# Patient Record
Sex: Female | Born: 1975
Health system: Southern US, Community
[De-identification: ages and names within clinical notes are randomized; demographics above are authoritative.]

## PROBLEM LIST (undated history)

## (undated) DIAGNOSIS — R002 Palpitations: Secondary | ICD-10-CM

## (undated) DIAGNOSIS — J45909 Unspecified asthma, uncomplicated: Secondary | ICD-10-CM

## (undated) HISTORY — PX: TUBAL LIGATION: SHX77

## (undated) HISTORY — PX: GALLBLADDER SURGERY: SHX652

## (undated) HISTORY — DX: Palpitations: R00.2

---

## 1997-09-25 ENCOUNTER — Ambulatory Visit (HOSPITAL_COMMUNITY): Admission: RE | Admit: 1997-09-25 | Discharge: 1997-09-25 | Payer: 59 | Admitting: *Deleted

## 1997-12-28 ENCOUNTER — Ambulatory Visit (HOSPITAL_COMMUNITY): Admission: RE | Admit: 1997-12-28 | Discharge: 1997-12-28 | Payer: Self-pay | Admitting: Obstetrics & Gynecology

## 1998-01-23 ENCOUNTER — Inpatient Hospital Stay (HOSPITAL_COMMUNITY): Admission: AD | Admit: 1998-01-23 | Discharge: 1998-01-23 | Payer: Self-pay | Admitting: Obstetrics

## 1998-02-26 ENCOUNTER — Encounter (HOSPITAL_COMMUNITY): Admission: RE | Admit: 1998-02-26 | Discharge: 1998-03-04 | Payer: Self-pay | Admitting: Obstetrics & Gynecology

## 1998-03-05 ENCOUNTER — Inpatient Hospital Stay (HOSPITAL_COMMUNITY): Admission: AD | Admit: 1998-03-05 | Discharge: 1998-03-07 | Payer: Self-pay | Admitting: *Deleted

## 1998-05-29 ENCOUNTER — Encounter: Payer: Self-pay | Admitting: Emergency Medicine

## 1998-05-29 ENCOUNTER — Inpatient Hospital Stay (HOSPITAL_COMMUNITY): Admission: EM | Admit: 1998-05-29 | Discharge: 1998-06-02 | Payer: Self-pay | Admitting: Emergency Medicine

## 1998-05-31 ENCOUNTER — Encounter: Payer: Self-pay | Admitting: Family Medicine

## 1998-10-17 ENCOUNTER — Emergency Department (HOSPITAL_COMMUNITY): Admission: EM | Admit: 1998-10-17 | Discharge: 1998-10-17 | Payer: Self-pay | Admitting: Emergency Medicine

## 1999-02-21 ENCOUNTER — Emergency Department (HOSPITAL_COMMUNITY): Admission: EM | Admit: 1999-02-21 | Discharge: 1999-02-21 | Payer: Self-pay | Admitting: Emergency Medicine

## 1999-02-23 ENCOUNTER — Encounter: Payer: Self-pay | Admitting: Emergency Medicine

## 1999-02-23 ENCOUNTER — Ambulatory Visit (HOSPITAL_COMMUNITY): Admission: RE | Admit: 1999-02-23 | Discharge: 1999-02-23 | Payer: Self-pay | Admitting: Emergency Medicine

## 1999-02-27 ENCOUNTER — Emergency Department (HOSPITAL_COMMUNITY): Admission: EM | Admit: 1999-02-27 | Discharge: 1999-02-27 | Payer: Self-pay | Admitting: *Deleted

## 1999-03-04 ENCOUNTER — Observation Stay (HOSPITAL_COMMUNITY): Admission: RE | Admit: 1999-03-04 | Discharge: 1999-03-05 | Payer: Self-pay | Admitting: General Surgery

## 1999-03-04 ENCOUNTER — Encounter (INDEPENDENT_AMBULATORY_CARE_PROVIDER_SITE_OTHER): Payer: Self-pay | Admitting: Specialist

## 1999-04-18 ENCOUNTER — Emergency Department (HOSPITAL_COMMUNITY): Admission: EM | Admit: 1999-04-18 | Discharge: 1999-04-18 | Payer: Self-pay | Admitting: Emergency Medicine

## 1999-10-02 ENCOUNTER — Encounter: Payer: Self-pay | Admitting: Emergency Medicine

## 1999-10-02 ENCOUNTER — Emergency Department (HOSPITAL_COMMUNITY): Admission: EM | Admit: 1999-10-02 | Discharge: 1999-10-02 | Payer: Self-pay | Admitting: Emergency Medicine

## 2000-01-29 ENCOUNTER — Encounter: Payer: Self-pay | Admitting: Emergency Medicine

## 2000-01-29 ENCOUNTER — Inpatient Hospital Stay (HOSPITAL_COMMUNITY): Admission: EM | Admit: 2000-01-29 | Discharge: 2000-02-02 | Payer: Self-pay | Admitting: Emergency Medicine

## 2000-02-08 ENCOUNTER — Ambulatory Visit (HOSPITAL_COMMUNITY): Admission: RE | Admit: 2000-02-08 | Discharge: 2000-02-08 | Payer: Self-pay | Admitting: *Deleted

## 2000-02-08 ENCOUNTER — Encounter: Payer: Self-pay | Admitting: *Deleted

## 2000-02-09 ENCOUNTER — Encounter: Admission: RE | Admit: 2000-02-09 | Discharge: 2000-02-09 | Payer: Self-pay | Admitting: Family Medicine

## 2000-04-16 ENCOUNTER — Encounter: Admission: RE | Admit: 2000-04-16 | Discharge: 2000-04-16 | Payer: Self-pay | Admitting: Sports Medicine

## 2000-04-16 ENCOUNTER — Inpatient Hospital Stay (HOSPITAL_COMMUNITY): Admission: AD | Admit: 2000-04-16 | Discharge: 2000-04-16 | Payer: Self-pay | Admitting: *Deleted

## 2000-05-26 ENCOUNTER — Inpatient Hospital Stay (HOSPITAL_COMMUNITY): Admission: AD | Admit: 2000-05-26 | Discharge: 2000-05-28 | Payer: Self-pay | Admitting: *Deleted

## 2000-11-12 ENCOUNTER — Encounter: Admission: RE | Admit: 2000-11-12 | Discharge: 2000-11-12 | Payer: Self-pay | Admitting: Family Medicine

## 2000-11-15 ENCOUNTER — Encounter: Admission: RE | Admit: 2000-11-15 | Discharge: 2000-11-15 | Payer: Self-pay | Admitting: Family Medicine

## 2001-01-19 ENCOUNTER — Encounter (INDEPENDENT_AMBULATORY_CARE_PROVIDER_SITE_OTHER): Payer: Self-pay | Admitting: *Deleted

## 2001-01-19 LAB — CONVERTED CEMR LAB

## 2001-12-09 ENCOUNTER — Encounter: Admission: RE | Admit: 2001-12-09 | Discharge: 2001-12-09 | Payer: Self-pay | Admitting: Family Medicine

## 2001-12-09 ENCOUNTER — Inpatient Hospital Stay (HOSPITAL_COMMUNITY): Admission: AD | Admit: 2001-12-09 | Discharge: 2001-12-11 | Payer: Self-pay | Admitting: Family Medicine

## 2001-12-09 ENCOUNTER — Encounter: Payer: Self-pay | Admitting: Family Medicine

## 2001-12-16 ENCOUNTER — Encounter: Admission: RE | Admit: 2001-12-16 | Discharge: 2001-12-16 | Payer: Self-pay | Admitting: Family Medicine

## 2002-09-09 ENCOUNTER — Encounter: Admission: RE | Admit: 2002-09-09 | Discharge: 2002-09-09 | Payer: Self-pay | Admitting: Family Medicine

## 2002-09-30 ENCOUNTER — Encounter: Admission: RE | Admit: 2002-09-30 | Discharge: 2002-09-30 | Payer: Self-pay | Admitting: Family Medicine

## 2002-11-27 ENCOUNTER — Encounter: Admission: RE | Admit: 2002-11-27 | Discharge: 2002-11-27 | Payer: Self-pay | Admitting: Family Medicine

## 2003-02-04 ENCOUNTER — Ambulatory Visit (HOSPITAL_COMMUNITY): Admission: RE | Admit: 2003-02-04 | Discharge: 2003-02-04 | Payer: Self-pay | Admitting: Family Medicine

## 2003-02-04 ENCOUNTER — Encounter: Admission: RE | Admit: 2003-02-04 | Discharge: 2003-02-04 | Payer: Self-pay | Admitting: Family Medicine

## 2003-02-13 ENCOUNTER — Encounter: Admission: RE | Admit: 2003-02-13 | Discharge: 2003-02-13 | Payer: Self-pay | Admitting: Family Medicine

## 2003-03-16 ENCOUNTER — Encounter: Admission: RE | Admit: 2003-03-16 | Discharge: 2003-03-16 | Payer: Self-pay | Admitting: Family Medicine

## 2003-03-30 ENCOUNTER — Encounter: Admission: RE | Admit: 2003-03-30 | Discharge: 2003-03-30 | Payer: Self-pay | Admitting: Sports Medicine

## 2003-05-07 ENCOUNTER — Encounter: Admission: RE | Admit: 2003-05-07 | Discharge: 2003-05-07 | Payer: Self-pay | Admitting: Family Medicine

## 2003-06-25 ENCOUNTER — Encounter: Admission: RE | Admit: 2003-06-25 | Discharge: 2003-06-25 | Payer: Self-pay | Admitting: Family Medicine

## 2003-08-05 ENCOUNTER — Encounter: Admission: RE | Admit: 2003-08-05 | Discharge: 2003-08-05 | Payer: Self-pay | Admitting: Family Medicine

## 2003-09-18 ENCOUNTER — Encounter: Admission: RE | Admit: 2003-09-18 | Discharge: 2003-09-18 | Payer: Self-pay | Admitting: Sports Medicine

## 2003-09-24 ENCOUNTER — Encounter: Admission: RE | Admit: 2003-09-24 | Discharge: 2003-09-24 | Payer: Self-pay | Admitting: Sports Medicine

## 2004-12-14 ENCOUNTER — Emergency Department (HOSPITAL_COMMUNITY): Admission: EM | Admit: 2004-12-14 | Discharge: 2004-12-14 | Payer: Self-pay | Admitting: Emergency Medicine

## 2005-10-19 ENCOUNTER — Ambulatory Visit: Payer: Self-pay | Admitting: Family Medicine

## 2005-10-21 ENCOUNTER — Ambulatory Visit: Payer: Self-pay | Admitting: Family Medicine

## 2005-10-21 ENCOUNTER — Inpatient Hospital Stay (HOSPITAL_COMMUNITY): Admission: AD | Admit: 2005-10-21 | Discharge: 2005-10-23 | Payer: Self-pay | Admitting: Family Medicine

## 2005-11-03 ENCOUNTER — Ambulatory Visit: Payer: Self-pay | Admitting: Sports Medicine

## 2005-12-14 ENCOUNTER — Other Ambulatory Visit: Admission: RE | Admit: 2005-12-14 | Discharge: 2005-12-14 | Payer: Self-pay | Admitting: Obstetrics and Gynecology

## 2006-03-06 ENCOUNTER — Ambulatory Visit: Payer: Self-pay | Admitting: Sports Medicine

## 2006-03-07 ENCOUNTER — Emergency Department (HOSPITAL_COMMUNITY): Admission: EM | Admit: 2006-03-07 | Discharge: 2006-03-07 | Payer: Self-pay | Admitting: Family Medicine

## 2006-03-08 ENCOUNTER — Inpatient Hospital Stay (HOSPITAL_COMMUNITY): Admission: EM | Admit: 2006-03-08 | Discharge: 2006-03-12 | Payer: Self-pay | Admitting: Emergency Medicine

## 2006-03-08 ENCOUNTER — Ambulatory Visit: Payer: Self-pay | Admitting: Family Medicine

## 2006-06-18 ENCOUNTER — Inpatient Hospital Stay (HOSPITAL_COMMUNITY): Admission: AD | Admit: 2006-06-18 | Discharge: 2006-06-18 | Payer: Self-pay | Admitting: Obstetrics and Gynecology

## 2006-06-24 ENCOUNTER — Inpatient Hospital Stay (HOSPITAL_COMMUNITY): Admission: AD | Admit: 2006-06-24 | Discharge: 2006-06-26 | Payer: Self-pay | Admitting: Obstetrics and Gynecology

## 2006-06-25 ENCOUNTER — Encounter (INDEPENDENT_AMBULATORY_CARE_PROVIDER_SITE_OTHER): Payer: Self-pay | Admitting: *Deleted

## 2006-06-27 ENCOUNTER — Encounter: Admission: RE | Admit: 2006-06-27 | Discharge: 2006-07-26 | Payer: Self-pay | Admitting: Obstetrics and Gynecology

## 2006-07-03 ENCOUNTER — Inpatient Hospital Stay (HOSPITAL_COMMUNITY): Admission: AD | Admit: 2006-07-03 | Discharge: 2006-07-03 | Payer: Self-pay | Admitting: Obstetrics and Gynecology

## 2006-07-27 ENCOUNTER — Encounter: Admission: RE | Admit: 2006-07-27 | Discharge: 2006-08-26 | Payer: Self-pay | Admitting: Obstetrics and Gynecology

## 2006-08-27 ENCOUNTER — Encounter: Admission: RE | Admit: 2006-08-27 | Discharge: 2006-09-26 | Payer: Self-pay | Admitting: Obstetrics and Gynecology

## 2006-09-27 ENCOUNTER — Encounter: Admission: RE | Admit: 2006-09-27 | Discharge: 2006-10-25 | Payer: Self-pay | Admitting: Obstetrics and Gynecology

## 2006-10-18 DIAGNOSIS — J4530 Mild persistent asthma, uncomplicated: Secondary | ICD-10-CM | POA: Insufficient documentation

## 2006-10-19 ENCOUNTER — Encounter (INDEPENDENT_AMBULATORY_CARE_PROVIDER_SITE_OTHER): Payer: Self-pay | Admitting: *Deleted

## 2006-10-26 ENCOUNTER — Encounter: Admission: RE | Admit: 2006-10-26 | Discharge: 2006-11-25 | Payer: Self-pay | Admitting: Obstetrics and Gynecology

## 2006-11-19 ENCOUNTER — Ambulatory Visit: Payer: Self-pay | Admitting: Family Medicine

## 2006-11-19 ENCOUNTER — Telehealth: Payer: Self-pay | Admitting: *Deleted

## 2006-11-19 DIAGNOSIS — J45901 Unspecified asthma with (acute) exacerbation: Secondary | ICD-10-CM | POA: Insufficient documentation

## 2006-11-26 ENCOUNTER — Encounter: Admission: RE | Admit: 2006-11-26 | Discharge: 2006-12-25 | Payer: Self-pay | Admitting: Obstetrics and Gynecology

## 2006-12-26 ENCOUNTER — Encounter: Admission: RE | Admit: 2006-12-26 | Discharge: 2007-01-01 | Payer: Self-pay | Admitting: Obstetrics and Gynecology

## 2007-04-29 ENCOUNTER — Ambulatory Visit: Payer: Self-pay | Admitting: Family Medicine

## 2007-04-29 DIAGNOSIS — L708 Other acne: Secondary | ICD-10-CM | POA: Insufficient documentation

## 2007-08-23 ENCOUNTER — Encounter: Payer: Self-pay | Admitting: *Deleted

## 2007-11-22 ENCOUNTER — Telehealth (INDEPENDENT_AMBULATORY_CARE_PROVIDER_SITE_OTHER): Payer: Self-pay | Admitting: Family Medicine

## 2007-11-25 ENCOUNTER — Ambulatory Visit: Payer: Self-pay | Admitting: Family Medicine

## 2008-01-02 ENCOUNTER — Encounter (INDEPENDENT_AMBULATORY_CARE_PROVIDER_SITE_OTHER): Payer: Self-pay | Admitting: Family Medicine

## 2008-01-02 ENCOUNTER — Ambulatory Visit: Payer: Self-pay | Admitting: Family Medicine

## 2008-01-02 DIAGNOSIS — E669 Obesity, unspecified: Secondary | ICD-10-CM | POA: Insufficient documentation

## 2008-01-02 DIAGNOSIS — N920 Excessive and frequent menstruation with regular cycle: Secondary | ICD-10-CM | POA: Insufficient documentation

## 2008-01-06 LAB — CONVERTED CEMR LAB: Pap Smear: NORMAL

## 2008-01-08 ENCOUNTER — Ambulatory Visit: Payer: Self-pay | Admitting: Family Medicine

## 2008-04-10 ENCOUNTER — Ambulatory Visit: Payer: Self-pay | Admitting: Family Medicine

## 2008-04-10 ENCOUNTER — Encounter (INDEPENDENT_AMBULATORY_CARE_PROVIDER_SITE_OTHER): Payer: Self-pay | Admitting: Family Medicine

## 2008-04-10 DIAGNOSIS — N949 Unspecified condition associated with female genital organs and menstrual cycle: Secondary | ICD-10-CM | POA: Insufficient documentation

## 2008-04-10 DIAGNOSIS — N938 Other specified abnormal uterine and vaginal bleeding: Secondary | ICD-10-CM | POA: Insufficient documentation

## 2008-04-10 DIAGNOSIS — N925 Other specified irregular menstruation: Secondary | ICD-10-CM | POA: Insufficient documentation

## 2008-04-10 LAB — CONVERTED CEMR LAB
Hemoglobin: 10.8 g/dL — ABNORMAL LOW (ref 12.0–15.0)
Platelets: 271 10*3/uL (ref 150–400)
RBC: 4.01 M/uL (ref 3.87–5.11)
RDW: 14.7 % (ref 11.5–15.5)
TSH: 1.981 microintl units/mL (ref 0.350–4.50)

## 2008-04-13 ENCOUNTER — Encounter (INDEPENDENT_AMBULATORY_CARE_PROVIDER_SITE_OTHER): Payer: Self-pay | Admitting: Family Medicine

## 2008-04-16 ENCOUNTER — Encounter: Admission: RE | Admit: 2008-04-16 | Discharge: 2008-04-16 | Payer: Self-pay | Admitting: Family Medicine

## 2008-04-20 ENCOUNTER — Encounter (INDEPENDENT_AMBULATORY_CARE_PROVIDER_SITE_OTHER): Payer: Self-pay | Admitting: Family Medicine

## 2008-10-07 ENCOUNTER — Telehealth: Payer: Self-pay | Admitting: *Deleted

## 2008-10-13 ENCOUNTER — Encounter (INDEPENDENT_AMBULATORY_CARE_PROVIDER_SITE_OTHER): Payer: Self-pay | Admitting: Family Medicine

## 2008-10-14 ENCOUNTER — Encounter (INDEPENDENT_AMBULATORY_CARE_PROVIDER_SITE_OTHER): Payer: Self-pay | Admitting: Family Medicine

## 2008-10-14 ENCOUNTER — Ambulatory Visit: Payer: Self-pay | Admitting: Family Medicine

## 2008-10-14 LAB — CONVERTED CEMR LAB: Rapid Strep: POSITIVE

## 2009-01-21 ENCOUNTER — Ambulatory Visit: Payer: Self-pay | Admitting: Family Medicine

## 2009-08-30 ENCOUNTER — Ambulatory Visit: Payer: Self-pay | Admitting: Family Medicine

## 2009-08-30 ENCOUNTER — Encounter: Payer: Self-pay | Admitting: Family Medicine

## 2009-08-30 ENCOUNTER — Telehealth: Payer: Self-pay | Admitting: Family Medicine

## 2009-11-22 ENCOUNTER — Encounter: Payer: Self-pay | Admitting: Family Medicine

## 2009-11-22 ENCOUNTER — Ambulatory Visit: Payer: Self-pay | Admitting: Family Medicine

## 2009-11-22 ENCOUNTER — Telehealth: Payer: Self-pay | Admitting: Family Medicine

## 2009-11-22 DIAGNOSIS — D4959 Neoplasm of unspecified behavior of other genitourinary organ: Secondary | ICD-10-CM | POA: Insufficient documentation

## 2009-11-22 LAB — CONVERTED CEMR LAB
Chloride: 102 meq/L (ref 96–112)
Creatinine, Ser: 0.57 mg/dL (ref 0.40–1.20)
Potassium: 4.4 meq/L (ref 3.5–5.3)
Sodium: 139 meq/L (ref 135–145)

## 2009-11-23 ENCOUNTER — Encounter: Payer: Self-pay | Admitting: Family Medicine

## 2009-11-25 ENCOUNTER — Ambulatory Visit (HOSPITAL_COMMUNITY): Admission: RE | Admit: 2009-11-25 | Discharge: 2009-11-25 | Payer: Self-pay | Admitting: Family Medicine

## 2009-11-25 ENCOUNTER — Encounter: Payer: Self-pay | Admitting: Family Medicine

## 2010-02-11 ENCOUNTER — Telehealth: Payer: Self-pay | Admitting: Family Medicine

## 2010-02-11 ENCOUNTER — Emergency Department (HOSPITAL_COMMUNITY): Admission: EM | Admit: 2010-02-11 | Discharge: 2010-02-11 | Payer: Self-pay | Admitting: Family Medicine

## 2010-02-14 ENCOUNTER — Ambulatory Visit: Payer: Self-pay | Admitting: Family Medicine

## 2010-02-14 ENCOUNTER — Telehealth: Payer: Self-pay | Admitting: Family Medicine

## 2010-02-14 LAB — CONVERTED CEMR LAB
Bilirubin Urine: NEGATIVE
Glucose, Urine, Semiquant: NEGATIVE
Ketones, urine, test strip: NEGATIVE
Specific Gravity, Urine: 1.025
WBC Urine, dipstick: NEGATIVE

## 2010-05-04 ENCOUNTER — Encounter: Payer: Self-pay | Admitting: Family Medicine

## 2010-05-23 ENCOUNTER — Encounter: Payer: Self-pay | Admitting: Family Medicine

## 2010-05-23 ENCOUNTER — Ambulatory Visit: Payer: Self-pay | Admitting: Vascular Surgery

## 2010-05-23 ENCOUNTER — Ambulatory Visit: Admission: RE | Admit: 2010-05-23 | Discharge: 2010-05-23 | Payer: Self-pay | Admitting: Family Medicine

## 2010-05-23 ENCOUNTER — Telehealth: Payer: Self-pay | Admitting: Family Medicine

## 2010-05-23 ENCOUNTER — Ambulatory Visit: Payer: Self-pay | Admitting: Family Medicine

## 2010-05-23 DIAGNOSIS — R609 Edema, unspecified: Secondary | ICD-10-CM | POA: Insufficient documentation

## 2010-05-23 LAB — CONVERTED CEMR LAB
Basophils Relative: 1 % (ref 0–1)
Eosinophils Absolute: 0.3 10*3/uL (ref 0.0–0.7)
Eosinophils Relative: 6 % — ABNORMAL HIGH (ref 0–5)
Lymphs Abs: 1.8 10*3/uL (ref 0.7–4.0)
MCV: 88 fL (ref 78.0–100.0)
Monocytes Relative: 4 % (ref 3–12)
Neutro Abs: 2.5 10*3/uL (ref 1.7–7.7)
RDW: 14.4 % (ref 11.5–15.5)
WBC: 4.8 10*3/uL (ref 4.0–10.5)

## 2010-05-24 ENCOUNTER — Encounter: Payer: Self-pay | Admitting: *Deleted

## 2010-06-16 ENCOUNTER — Ambulatory Visit: Payer: Self-pay | Admitting: Family Medicine

## 2010-06-16 ENCOUNTER — Encounter: Payer: Self-pay | Admitting: Sports Medicine

## 2010-06-30 ENCOUNTER — Telehealth: Payer: Self-pay | Admitting: *Deleted

## 2010-07-01 ENCOUNTER — Ambulatory Visit: Payer: Self-pay | Admitting: Family Medicine

## 2010-07-01 LAB — CONVERTED CEMR LAB
Bilirubin Urine: NEGATIVE
Ketones, urine, test strip: NEGATIVE
Specific Gravity, Urine: 1.02
Urobilinogen, UA: 0.2
pH: 6

## 2010-07-04 ENCOUNTER — Telehealth: Payer: Self-pay | Admitting: Family Medicine

## 2010-07-05 ENCOUNTER — Encounter: Payer: Self-pay | Admitting: Family Medicine

## 2010-09-10 ENCOUNTER — Encounter: Payer: Self-pay | Admitting: Family Medicine

## 2010-09-20 NOTE — Assessment & Plan Note (Signed)
Summary: asthma/Donna Carlson/Donna Carlson's   Vital Signs:  Patient profile:   35 year old female Height:      64 inches Weight:      210 pounds BMI:     36.18 BSA:     2.00 O2 Sat:      99 % on Room air Temp:     98.8 degrees F Pulse rate:   82 / minute BP sitting:   139 / 89  Vitals Entered By: Jone Baseman CMA (August 30, 2009 1:47 PM)  O2 Flow:  Room air CC: asthma since saturday Is Patient Diabetic? No Pain Assessment Patient in pain? no        Primary Care Provider:  Lupita Raider MD  CC:  asthma since saturday.  History of Present Illness: Asthma flare for last few days worsening breathing with wheezing. Using albuterol nebs every 4 hrs last night. No fever or purulent sputum or shortness of breath at rest or leg swelling.  Similar to previous asthma episodes.  Hospitlaized last in 2007 when pregant with asthma  ROS - as above PMH - Medications reviewed and updated in medication list.  Smoking Status noted in VS form    Habits & Providers  Alcohol-Tobacco-Diet     Tobacco Status: never  Current Medications (verified): 1)  Albuterol Sulfate (5 Mg/ml) 0.5% Nebu (Albuterol Sulfate) .... Inhale 1 Ml As Directed Four Times A Day - 1 Box 2)  Flovent Hfa 220 Mcg/act Aero (Fluticasone Propionate  Hfa) .... Inhale 1 Puff Using Inhaler Twice A Day 3)  Proair Hfa 108 (90 Base) Mcg/act  Aers (Albuterol Sulfate) .... Two Puffs Every Four Hours As Needed For Wheezing 4)  Valtrex 500 Mg  Tabs (Valacyclovir Hcl) .... One By Mouth Two Times A Day As Needed 5)  Prednisone 20 Mg Tabs (Prednisone) .... 2 Tabs A Day For 7 Days  Allergies: 1)  ! * Shellfish  Physical Exam  General:  awake alert no acute distress  Mouth:  Oral mucosa and oropharynx without lesions or exudates.  Teeth in good repair. Neck:  No deformities, masses, or tenderness noted. Lungs:  diffuse mild expiratory wheeze increased with deep breath no rales or increased work of breathing  Heart:  Normal rate and  regular rhythm. S1 and S2 normal without gallop, murmur, click, rub or other extra sounds. Extremities:  No clubbing, cyanosis, edema, or deformity noted with normal full range of motion of all joints.     Impression & Recommendations:  Problem # 1:  ASTHMA NOS W/ACUTE EXACERBATION (ICD-493.92)  Moderate to mild exacerbation.  No signs of bacterial infection or cyanosis but increased use of albuterol.  Will start prednisone for 7 days and continue Flovent.  Discussed doubling of this at the beginning of next exacerbation.   Her updated medication list for this problem includes:    Albuterol Sulfate (5 Mg/ml) 0.5% Nebu (Albuterol sulfate) ..... Inhale 1 ml as directed four times a day - 1 box    Flovent Hfa 220 Mcg/act Aero (Fluticasone propionate  hfa) ..... Inhale 1 puff using inhaler twice a day    Proair Hfa 108 (90 Base) Mcg/act Aers (Albuterol sulfate) .Marland Kitchen..Marland Kitchen Two puffs every four hours as needed for wheezing    Prednisone 20 Mg Tabs (Prednisone) .Marland Kitchen... 2 tabs a day for 7 days  Orders: [facility]- Est  Level 4 ([address])  Complete Medication List: 1)  Albuterol Sulfate (5 Mg/ml) 0.5% Nebu (Albuterol sulfate) .... Inhale 1 ml as directed four times a day -  1 box 2)  Flovent Hfa 220 Mcg/act Aero (Fluticasone propionate  hfa) .... Inhale 1 puff using inhaler twice a day 3)  Proair Hfa 108 (90 Base) Mcg/act Aers (Albuterol sulfate) .... Two puffs every four hours as needed for wheezing 4)  Valtrex 500 Mg Tabs (Valacyclovir hcl) .... One by mouth two times a day as needed 5)  Prednisone 20 Mg Tabs (Prednisone) .... 2 tabs a day for 7 days  Other Orders: Pulse Oximetry- [facility] [phone] [phone])  Patient Instructions: 1)  Follow up with your primary doctor as usual 2)  Call if no better in 24 hours or if getting worse 3)  If any high fever or leg swelling call us Prescriptions: FLOVENT HFA 220 MCG/ACT AERO (FLUTICASONE PROPIONATE  HFA) Inhale 1 puff using inhaler twice a day  #1 x 11   Entered and Authorized  by:   Pearlean Brownie MD   Signed by:   Pearlean Brownie MD on 08/30/2009   Method used:   Electronically to        [facility]* (retail)       [address]       [address] Shipping/mailing       [place], [place]  [address]       Ph: [phone]       Fax: [phone]   RxID:   [id number] ALBUTEROL SULFATE (5 MG/ML) 0.5% NEBU (ALBUTEROL SULFATE) Inhale 1 ml as directed four times a day - 1 box  #1 x 3   Entered and Authorized by:   Pearlean Brownie MD   Signed by:   Pearlean Brownie MD on 08/30/2009   Method used:   Electronically to        [facility]* (retail)       [address]       [address] Shipping/mailing       [place], [place]  [address]       Ph: [phone]       Fax: [phone]   RxID:   [id number] PREDNISONE 20 MG TABS (PREDNISONE) 2 tabs a day for 7 days  #14 x 0   Entered and Authorized by:   Pearlean Brownie MD   Signed by:   Pearlean Brownie MD on 08/30/2009   Method used:   Print then Give to Patient   RxID:   [id number]

## 2010-09-20 NOTE — Letter (Signed)
Summary: Generic Letter  [facility]  [address]   [place], [place] [address]   Phone: [phone]  Fax: [phone]    11/23/2009  Donna Carlson [address] [place], [place]  [address]  Dear [name],    I just wanted to let you know that your lab results were normal.  Please call me if you have any questions or concerns.          Sincerely,   Asher Muir MD  Appended Document: Generic Letter pt letter mailed

## 2010-09-20 NOTE — Letter (Signed)
Summary: Handout Printed  Printed Handout:  - Asthma, Acute Bronchospasm 

## 2010-09-20 NOTE — Assessment & Plan Note (Signed)
Summary: ? UTI/kf   Vital Signs:  Patient profile:   35 year old female Weight:      211.6 pounds Temp:     98.5 degrees F oral Pulse rate:   100 / minute BP sitting:   126 / 80  (right arm)  Vitals Entered By: Arlyss Repress CMA, (July 01, 2010 10:57 AM) CC: pressure with urination x 3 days. Is Patient Diabetic? No Pain Assessment Patient in pain? no        Primary Care Provider:  Antoine Primas DO  CC:  pressure with urination x 3 days.Marland Kitchen  History of Present Illness:     Suprapubic pressure x 3 days, also with low back pain, no nausea, no vomiting, foul odor to urine, +dysuria, no fever, no vaginal discharge, no abd cramping, no change in BM No history of UTI frequent urination S/p course of prednisone and Doxy for asthma/bronchitis, improved, now able to get more sputum up with mucinex DM, using albuterol every am feels SOB when walking to work  Habits & Providers  Alcohol-Tobacco-Diet     Tobacco Status: never  Current Medications (verified): 1)  Albuterol Sulfate (5 Mg/ml) 0.5% Nebu (Albuterol Sulfate) .... Inhale 1 Ml As Directed Four Times A Day - 1 Box 2)  Flovent Hfa 220 Mcg/act Aero (Fluticasone Propionate  Hfa) .... Inhale 1 Puff Using Inhaler Twice A Day 3)  Ventolin Hfa 108 (90 Base) Mcg/act Aers (Albuterol Sulfate) .... One Puff Q4h As Needed For Sob/wheeze 4)  Valtrex 500 Mg  Tabs (Valacyclovir Hcl) .... One By Mouth Two Times A Day As Needed 5)  Keflex 500 Mg Caps (Cephalexin) .Marland Kitchen.. 1 By Mouth Two Times A Day X 5 Days For Urine Infection 6)  Pyridium 200 Mg Tabs (Phenazopyridine Hcl) .Marland Kitchen.. 1 By Mouth Two Times A Day For 3 Days Bladder Pain  Allergies (verified): 1)  ! * Shellfish  Physical Exam  General:  Well-developed,well-nourished,in no acute distress; alert,appropriate and cooperative throughout examination overwight Vital signs noted  Lungs:  Good air movement, occ wheeze, nomral WOB otherwise CTAB Abdomen:  Soft, +suprapubic pressure, no  CVA tenderness, no rebound, no garuding, no mass felt   Impression & Recommendations:  Problem # 1:  ACUTE CYSTITIS (ICD-595.0) Assessment New  Treat uncomplicated UTI with keflex, pyridum given red flags to return see instructions If pt develops yeast infection after 2 rounds of antibiotics told I would give fluconazole via phone The following medications were removed from the medication list:    Doxycycline Hyclate 100 Mg Caps (Doxycycline hyclate) ..... One cap by mouth two times a day x 7d Her updated medication list for this problem includes:    Keflex 500 Mg Caps (Cephalexin) .Marland Kitchen... 1 by mouth two times a day x 5 days for urine infection    Pyridium 200 Mg Tabs (Phenazopyridine hcl) .Marland Kitchen... 1 by mouth two times a day for 3 days bladder pain  Orders: [facility]- Est Level  3 ([address])  Problem # 2:  ASTHMA NOS W/ACUTE EXACERBATION (ICD-493.92) Assessment: Improved  Consider switching pt to Advair if increased use of albuterol persist  The following medications were removed from the medication list:    Prednisone 50 Mg Tabs (Prednisone) ..... One tab by mouth daily x 5 days. Her updated medication list for this problem includes:    Albuterol Sulfate (5 Mg/ml) 0.5% Nebu (Albuterol sulfate) ..... Inhale 1 ml as directed four times a day - 1 box    Flovent Hfa 220 Mcg/act Aero (Fluticasone  propionate  hfa) ..... Inhale 1 puff using inhaler twice a day    Ventolin Hfa 108 (90 Base) Mcg/act Aers (Albuterol sulfate) ..... One puff q4h as needed for sob/wheeze  Orders: [facility]- Est Level  3 ([address])  Complete Medication List: 1)  Albuterol Sulfate (5 Mg/ml) 0.5% Nebu (Albuterol sulfate) .... Inhale 1 ml as directed four times a day - 1 box 2)  Flovent Hfa 220 Mcg/act Aero (Fluticasone propionate  hfa) .... Inhale 1 puff using inhaler twice a day 3)  Ventolin Hfa 108 (90 Base) Mcg/act Aers (Albuterol sulfate) .... One puff q4h as needed for sob/wheeze 4)  Valtrex 500 Mg Tabs (Valacyclovir hcl) ....  One by mouth two times a day as needed 5)  Keflex 500 Mg Caps (Cephalexin) .Marland Kitchen.. 1 by mouth two times a day x 5 days for urine infection 6)  Pyridium 200 Mg Tabs (Phenazopyridine hcl) .Marland Kitchen.. 1 by mouth two times a day for 3 days bladder pain  Other Orders: Urinalysis-[facility] (00000)  Patient Instructions: 1)  Take the keflex for the urine infection 2)  Pyridum is for pain, this may turn your urine orange or red 3)  If you feel like you have a yeast infection, call in and we can prescribed something for you 4)  I reccomend taking a probioitc or eating yogurt for the next 2 weeks to help replenish your good bacteria after taking so many antibiotics Prescriptions: PYRIDIUM 200 MG TABS (PHENAZOPYRIDINE HCL) 1 by mouth two times a day for 3 days bladder pain  #6 x 0   Entered and Authorized by:   Milinda Antis MD   Signed by:   Milinda Antis MD on 07/01/2010   Method used:   Electronically to        [facility]* (retail)       [address]       [address] Shipping/mailing       [place], [place]  [address]       Ph: [phone]       Fax: [phone]   RxID:   [id number] KEFLEX 500 MG CAPS (CEPHALEXIN) 1 by mouth two times a day x 5 days for urine infection  #10 x 0   Entered and Authorized by:   Milinda Antis MD   Signed by:   Milinda Antis MD on 07/01/2010   Method used:   Electronically to        [facility]* (retail)       [address]       [address] Shipping/mailing       [place], [place]  [address]       Ph: [phone]       Fax: [phone]   RxID:   [id number]    Orders Added: 1)  Urinalysis-[facility] [00000] 2)  [facility]- Est Level  3 [[address]]    Laboratory Results   Urine Tests  Date/Time Received: July 01, 2010 10:57 AM  Date/Time Reported: July 01, 2010 11:26 AM   Routine Urinalysis   Color: yellow Appearance: Cloudy Glucose: negative   (Normal Range: Negative) Bilirubin: negative   (Normal Range:  Negative) Ketone: negative   (Normal Range: Negative) Spec. Gravity: 1.020   (Normal Range: 1.003-1.035) Blood: moderate   (Normal Range: Negative) pH: 6.0   (Normal Range: 5.0-8.0) Protein: negative   (Normal Range: Negative) Urobilinogen: 0.2   (Normal Range: 0-1) Nitrite: negative   (Normal Range: Negative) Leukocyte Esterace: large   (  Normal Range: Negative)  Urine Microscopic WBC/HPF: loaded with clumps Bacteria/HPF: 2+ Epithelial/HPF: 1-5    Comments: ...............test performed by......Marland KitchenBonnie A. [place], MLS (ASCP)cm

## 2010-09-20 NOTE — Progress Notes (Signed)
Summary: triage  Phone Note Call from Patient Call back at Home Phone [phone]   Caller: Patient Summary of Call: Pt having heavy bleeding and is weak and dizzy.  Wondering if she can be seen today? Initial call taken by: Clydell Hakim,  November 22, 2009 2:25 PM  Follow-up for Phone Call        started last night at 5. changing pads every 1.5 hrs. feels very weak & tired & dizzy. states she always has heavy periods but the weakness & dizziness is new. to see Dr. Lafonda Mosses at 3pm Follow-up by: Golden Circle RN,  November 22, 2009 2:34 PM

## 2010-09-20 NOTE — Assessment & Plan Note (Signed)
Summary: heavy menses/[place]/smith   Vital Signs:  Patient profile:   35 year old female Height:      64 inches Weight:      207.6 pounds BMI:     35.76 Temp:     98.5 degrees F oral Pulse rate:   69 / minute BP sitting:   125 / 91  (left arm) Cuff size:   large  Vitals Entered By: Gladstone Pih (November 22, 2009 3:09 PM) CC: C/O heavy menses, tired and dizzy since last night Is Patient Diabetic? No Pain Assessment Patient in pain? no        Primary Care Provider:  Lupita Raider MD  CC:  C/O heavy menses and tired and dizzy since last night.  History of Present Illness: always had irregular and heavy periods.  this period is not any heavier than usual, but the tiredness, dizziness (lightheadedness) is new.  just started yesterday evening.  no PICA.    bleeding:  typical period has to change pad every 1.5-2 hours.  has to wake up 2-3 times during the night to change pads.  period is very unpredictable.  last period before this one was about 3 months ago.  has been this way for past 11 years (since birth of son).    evaluated for this complaint in 8/09,  sent for an ultrasound.  no fibroids found, but did have ovarian cysts.  neoplasm could not be ruled out; so f/u ultrasound recommended.  pt did not come in for a while after this and never had the f/u ultrasound.   Habits & Providers  Alcohol-Tobacco-Diet     Tobacco Status: never  Current Medications (verified): 1)  Albuterol Sulfate (5 Mg/ml) 0.5% Nebu (Albuterol Sulfate) .... Inhale 1 Ml As Directed Four Times A Day - 1 Box 2)  Flovent Hfa 220 Mcg/act Aero (Fluticasone Propionate  Hfa) .... Inhale 1 Puff Using Inhaler Twice A Day 3)  Proair Hfa 108 (90 Base) Mcg/act  Aers (Albuterol Sulfate) .... Two Puffs Every Four Hours As Needed For Wheezing 4)  Valtrex 500 Mg  Tabs (Valacyclovir Hcl) .... One By Mouth Two Times A Day As Needed  Allergies: 1)  ! * Shellfish  Review of Systems General:  Complains of fatigue and loss  of appetite; denies fever and weight loss. CV:  Complains of fatigue; denies chest pain or discomfort, fainting, and shortness of breath with exertion. Heme:  Denies abnormal bruising; nose bleed this am, but this was the only time.Marland Kitchen  Physical Exam  General:  awake alert no acute distress.  appears Genitalia:  Pelvic Exam:        External: normal female genitalia without lesions or masses        Vagina: normal without lesions or masses        Cervix: normal without lesions or masses; blood coming from os        Adnexa: normal bimanual exam without masses or fullness        Uterus: normal by palpation        Pap smear: not performed  Skin:  facial acne Additional Exam:  vital signs reviewed    Impression & Recommendations:  Problem # 1:  DYSFUNCTIONAL UTERINE BLEEDING (ICD-626.8) Assessment Deteriorated hgb normal.  bleeding appears to be coming from os.  think she is likely having some anovulatory cycles.  she does need to follow up the ovarian lesions seen on u/s in 5/09; however, I doubt this is the source of her  heavy bleeding.  she is not really interested in trying ocps again.  think pcos is possible cause of her irregular cycles.   she is overweight and has acne, but no hirsutism.   Will check TSH and fasting glucose.    Orders: Hemoglobin-[facility] ([address]) TSH-[facility] ([id number]) [facility]- Est Level  3 ([address])  Complete Medication List: 1)  Albuterol Sulfate (5 Mg/ml) 0.5% Nebu (Albuterol sulfate) .... Inhale 1 ml as directed four times a day - 1 box 2)  Flovent Hfa 220 Mcg/act Aero (Fluticasone propionate  hfa) .... Inhale 1 puff using inhaler twice a day 3)  Proair Hfa 108 (90 Base) Mcg/act Aers (Albuterol sulfate) .... Two puffs every four hours as needed for wheezing 4)  Valtrex 500 Mg Tabs (Valacyclovir hcl) .... One by mouth two times a day as needed  Other Orders: Ultrasound (Ultrasound) Basic Met-[facility] ([id number])  Patient Instructions: 1)  It was nice to see you today. 2)   You are not anemic. 3)  Be sure to eat and drink the next few days.  That will help with your energy. 4)  I will call if your thyroid test is not normal. 5)  I will call you with your ultrasound results. 6)  Please schedule a follow-up appointment if your tiredness gets worse or if you don't have a period for >3 months.    Laboratory Results   Blood Tests   Date/Time Received: November 22, 2009 3:19 PM  Date/Time Reported: November 22, 2009 3:28 PM     CBC   HGB:  12.7 g/dL   (Normal Range: 57.8-46.9 in Males, 12.0-15.0 in Females) Comments: ...........test performed by...........Marland KitchenTerese Door, CMA

## 2010-09-20 NOTE — Assessment & Plan Note (Signed)
Summary: feet swollen/[place]/smith   Vital Signs:  Patient profile:   35 year old female Height:      64 inches Weight:      207 pounds BMI:     35.66 Temp:     98.4 degrees F oral Pulse rate:   84 / minute BP sitting:   136 / 95  (left arm) Cuff size:   large  Vitals Entered By: Jimmy Footman, CMA (May 23, 2010 10:10 AM) CC: left leg swelling and pain x4 days Is Patient Diabetic? No Pain Assessment Patient in pain? yes     Location: left leg Intensity: 10 Type: sharp   Primary Care Provider:  Antoine Primas DO  CC:  left leg swelling and pain x4 days.  History of Present Illness: 35 yo seen for work-in appt  Right leg pain and foot swelling for 3-4 days.  No fever, chills, n/v.   no overuse, no trauma, no new medicines.  No recent surgery or immobilization.  No history of this previously.  has tried elevation without improvement.  Now worsenign pain so she has difficulty walking.  Habits & Providers  Alcohol-Tobacco-Diet     Tobacco Status: never  Current Medications (verified): 1)  Albuterol Sulfate (5 Mg/ml) 0.5% Nebu (Albuterol Sulfate) .... Inhale 1 Ml As Directed Four Times A Day - 1 Box 2)  Flovent Hfa 220 Mcg/act Aero (Fluticasone Propionate  Hfa) .... Inhale 1 Puff Using Inhaler Twice A Day 3)  Proair Hfa 108 (90 Base) Mcg/act  Aers (Albuterol Sulfate) .... Two Puffs Every Four Hours As Needed For Wheezing 4)  Valtrex 500 Mg  Tabs (Valacyclovir Hcl) .... One By Mouth Two Times A Day As Needed  Allergies: 1)  ! * Shellfish PMH-FH-SH reviewed-no changes except otherwise noted  Review of Systems      See HPI  Physical Exam  General:  obese, alert ,NAD vitals reviewed Extremities:  Left LE severely painful on palpation on medial calf moving down leg and painful on entire surface of top of foot.   minimal redness, no erythema on medial calf.  Left calf actually smaller than right calf by 1.5 cm.  foot swollen diffusely, non pitting.  No cellultis  noted. Neurologic:  normal sensationot light touch.  neurovascularly intact.  normal strength.   Impression & Recommendations:  Problem # 1:  LEG EDEMA, LEFT (ICD-782.3) Precepted with Drs. Nedra Hai and [place].  Will get doppler today to r/o DVT.  Will get CBC to look fo signs of infection but clinically non-toxic, no obvious cellultis.  radiology to call with prelim read.  If negative will discuss with radiology next imaging study to obtain.  I prescribed ibuprofen today.   Orders: LE Venous Duplex (DVT) (DVT) [facility]- Est Level  3 ([address])  Complete Medication List: 1)  Albuterol Sulfate (5 Mg/ml) 0.5% Nebu (Albuterol sulfate) .... Inhale 1 ml as directed four times a day - 1 box 2)  Flovent Hfa 220 Mcg/act Aero (Fluticasone propionate  hfa) .... Inhale 1 puff using inhaler twice a day 3)  Proair Hfa 108 (90 Base) Mcg/act Aers (Albuterol sulfate) .... Two puffs every four hours as needed for wheezing 4)  Valtrex 500 Mg Tabs (Valacyclovir hcl) .... One by mouth two times a day as needed 5)  Ibuprofen 600 Mg Tabs (Ibuprofen) .... One q6 as needed pain  Other Orders: CBC w/Diff-[facility] ([address]) Sed Rate (ESR)-[facility] [id number])  Patient Instructions: 1)  will send you for ultrasound to see if you have  ab lood clot thatis causing this pain. 2)  Will call you when I get results and see if we need further tests to figure out why you have so much pain. Prescriptions: IBUPROFEN 600 MG TABS (IBUPROFEN) one q6 as needed pain  #30 x 0   Entered and Authorized by:   Delbert Harness MD   Signed by:   Delbert Harness MD on 05/23/2010   Method used:   Handwritten   RxID:   [id number]   Appended Document: feet swollen/[place]/smith   Medication Administration  Medication # 1:    Medication: Ibuprofen 200mg  tab    Diagnosis: LEG EDEMA, LEFT (ICD-782.3)    Dose: 3 tablets    Route: po    Exp Date: 11/20/2011    Lot #: [id number]    Mfr: [facility] [place]    Patient tolerated medication without complications     Given by: Jimmy Footman, CMA (May 23, 2010 12:11 PM)  Orders Added: 1)  Ibuprofen 200mg  tab [EMRORAL]   Appended Document: Sedrate  20 mm/hr    Lab Visit  Laboratory Results   Blood Tests   Date/Time Received: May 23, 2010 10:58 AM  Date/Time Reported: May 23, 2010 12:34 PM   SED rate: 20  mm/hr  Comments: ...............test performed by......Marland KitchenBonnie A. [place], MLS (ASCP)cm    Orders Today:

## 2010-09-20 NOTE — Miscellaneous (Signed)
   Clinical Lists Changes  Problems: Changed problem from ASTHMA, UNSPECIFIED (ICD-493.90) to ASTHMA, INTERMITTENT (ICD-493.90) 

## 2010-09-20 NOTE — Progress Notes (Signed)
Summary: triage  Phone Note Call from Patient Call back at Home Phone [phone]   Caller: Patient Summary of Call: sinus drainage/fever- wants to come in today Initial call taken by: De Nurse,  February 11, 2010 2:11 PM  Follow-up for Phone Call        LM Follow-up by: Golden Circle RN,  February 11, 2010 2:15 PM  Additional Follow-up for Phone Call Additional follow up Details #1::        spoke with pt & sent her to [facility]. she agreed with plan Additional Follow-up by: Golden Circle RN,  February 11, 2010 2:45 PM

## 2010-09-20 NOTE — Letter (Signed)
Summary: Generic Letter  [facility]  [address]   [place], [place] [address]   Phone: [phone]  Fax: [phone]    11/25/2009  KELVIN BURPEE [address] [place], [place]  [address]  Dear Ms. Edmiston,    I just wanted to let you know that your ultrasound results were normal.  Please call me if you have any questions or concerns.          Sincerely,   Asher Muir MD  Appended Document: Generic Letter mailed.

## 2010-09-20 NOTE — Progress Notes (Signed)
Summary: triage  Phone Note Call from Patient Call back at Home Phone [phone]   Caller: Patient Summary of Call: Pt was to be seen this morning and car is not working can get ride today but not till after 11.  Can she be worked in her asthma is acting up. Initial call taken by: Clydell Hakim,  August 30, 2009 10:20 AM  Follow-up for Phone Call        states she can get a ride this afternoon. placed in 1:30 work in. states her asthma has been acting up. had to use nebs freq last night. has used once this am. voice & breathing normal over the phone during this call. states the nebs last about 4-5 hours. to cal back if breathing gets worse before appt Follow-up by: Golden Circle RN,  August 30, 2009 10:26 AM

## 2010-09-20 NOTE — Progress Notes (Signed)
Summary: triage  Phone Note Call from Patient Call back at Home Phone [phone]   Caller: Patient Summary of Call: Thinks she has a bladder infection.  Can she be seen today? Initial call taken by: Clydell Hakim,  February 14, 2010 8:49 AM  Follow-up for Phone Call        [facility] gave her mucinex & Amoxicillin. hx asthma. she is wheezing at night. also has pressure & pain with urination. asked her to come in now to see the work in md. states she will be here within the next half hour Follow-up by: Golden Circle RN,  February 14, 2010 9:14 AM

## 2010-09-20 NOTE — Assessment & Plan Note (Signed)
Summary: SINUS INFECTION/ASTHMA/KH   Vital Signs:  Patient profile:   35 year old female Height:      64 inches Weight:      209 pounds BMI:     36.00 O2 Sat:      98 % on Room air Temp:     98.2 degrees F oral Pulse rate:   69 / minute BP sitting:   132 / 78  (left arm) Cuff size:   large  Vitals Entered By: Jimmy Footman, CMA (June 16, 2010 11:47 AM)  O2 Flow:  Room air CC: asthma flare up and sinus congestion x2 days Is Patient Diabetic? No   Primary Care Provider:  Antoine Primas DO  CC:  asthma flare up and sinus congestion x2 days.  History of Present Illness: URI Symptoms Onset: 2d Description: stuffy/runny nose, wheezing.  Symptoms Nasal discharge: Yes clear Fever: NO Sore throat: NO Cough: NO Wheezing: YES Ear pain: NO GI symptoms: NO Sick contacts: NO  Red Flags  Stiff neck: NO Dyspnea: minimal, hx asthma. Rash: NO Swallowing difficulty: NO  Sinusitis Risk Factors Headache/face pain: NO Double sickening: NO tooth pain: NO  Allergy Risk Factors Sneezing: NO Itchy scratchy throat: NO Seasonal symptoms: NO  Flu Risk Factors Headache: NO muscle aches: NO severe fatigue: NO    Habits & Providers  Alcohol-Tobacco-Diet     Tobacco Status: never  Exercise-Depression-Behavior     Have you felt down or hopeless? no     Have you felt little pleasure in things? no     Depression Counseling: not indicated; screening negative for depression  Current Medications (verified): 1)  Albuterol Sulfate (5 Mg/ml) 0.5% Nebu (Albuterol Sulfate) .... Inhale 1 Ml As Directed Four Times A Day - 1 Box 2)  Flovent Hfa 220 Mcg/act Aero (Fluticasone Propionate  Hfa) .... Inhale 1 Puff Using Inhaler Twice A Day 3)  Ventolin Hfa 108 (90 Base) Mcg/act Aers (Albuterol Sulfate) .... One Puff Q4h As Needed For Sob/wheeze 4)  Valtrex 500 Mg  Tabs (Valacyclovir Hcl) .... One By Mouth Two Times A Day As Needed 5)  Ibuprofen 600 Mg Tabs (Ibuprofen) .... One Q6 As Needed  Pain 6)  Doxycycline Hyclate 100 Mg Caps (Doxycycline Hyclate) .... One Cap By Mouth Two Times A Day X 7d 7)  Prednisone 50 Mg Tabs (Prednisone) .... One Tab By Mouth Daily X 5 Days.  Allergies (verified): 1)  ! * Shellfish  Review of Systems       See HPI  Physical Exam  General:  Well-developed,well-nourished,in no acute distress; alert,appropriate and cooperative throughout examination Head:  Normocephalic and atraumatic without obvious abnormalities.  Eyes:  No corneal or conjunctival inflammation noted. EOMI. Perrl Ears:  External ear exam shows no significant lesions or deformities.  Otoscopic examination reveals clear canals, tympanic membranes are intact bilaterally without bulging, retraction, inflammation or discharge. Hearing is grossly normal bilaterally. Nose:  External nasal examination shows no deformity or inflammation. Nasal mucosa are pink and moist without lesions or exudates. Mouth:  Oral mucosa and oropharynx without lesions or exudates.   Neck:  No deformities, masses, or tenderness noted. Lungs:  Good air movement, minimal and faint wheeze heard throughout, no accessory muscle use, no nasal flaring.  Speaks full sentences. Heart:  Normal rate and regular rhythm. S1 and S2 normal without gallop, murmur, click, rub or other extra sounds. Extremities:  No edema. Additional Exam:  Peak Flow: 310 Predicted: 487 She is at 64% of predicted.   Impression &  Recommendations:  Problem # 1:  ASTHMA, INTERMITTENT (ICD-493.90) Assessment Deteriorated In acute exacerbation possibly related to viral infection. Peak flows 64% of predicted. Albuterol helping at home. Solumedrol IM in office. Pred burst Doxy x 7d as this has been shown to reduce recurrence/hospital readmission in COPD patients. Changed inhaler to ventolin as requested by pt. RTC as needed if no better in 1 week.  Her updated medication list for this problem includes:    Albuterol Sulfate (5 Mg/ml) 0.5%  Nebu (Albuterol sulfate) ..... Inhale 1 ml as directed four times a day - 1 box    Flovent Hfa 220 Mcg/act Aero (Fluticasone propionate  hfa) ..... Inhale 1 puff using inhaler twice a day    Ventolin Hfa 108 (90 Base) Mcg/act Aers (Albuterol sulfate) ..... One puff q4h as needed for sob/wheeze    Prednisone 50 Mg Tabs (Prednisone) ..... One tab by mouth daily x 5 days.  Orders: [facility]- Est  Level 4 (99214)  Complete Medication List: 1)  Albuterol Sulfate (5 Mg/ml) 0.5% Nebu (Albuterol sulfate) .... Inhale 1 ml as directed four times a day - 1 box 2)  Flovent Hfa 220 Mcg/act Aero (Fluticasone propionate  hfa) .... Inhale 1 puff using inhaler twice a day 3)  Ventolin Hfa 108 (90 Base) Mcg/act Aers (Albuterol sulfate) .... One puff q4h as needed for sob/wheeze 4)  Valtrex 500 Mg Tabs (Valacyclovir hcl) .... One by mouth two times a day as needed 5)  Ibuprofen 600 Mg Tabs (Ibuprofen) .... One q6 as needed pain 6)  Doxycycline Hyclate 100 Mg Caps (Doxycycline hyclate) .... One cap by mouth two times a day x 7d 7)  Prednisone 50 Mg Tabs (Prednisone) .... One tab by mouth daily x 5 days.  Other Orders: PeakFlow- [facility] (94150) Solumedrol up to 125mg  (W1191)  Patient Instructions: 1)  Good to meet you, 2)  You are having a mild asthma exacerbation from a viral infection. 3)  Will treat you with a course of doxycycline. 4)  Steroid shot in the office and prednisone for 5d. 5)  Changed to ventolin as requested. 6)  Come back if no better in a week. 7)  -Dr. Karie Schwalbe. Prescriptions: PREDNISONE 50 MG TABS (PREDNISONE) One tab by mouth daily x 5 days.  #5 x 0   Entered and Authorized by:   Rodney Langton MD   Signed by:   Rodney Langton MD on 06/16/2010   Method used:   Print then Give to Patient   RxID:   [id number] DOXYCYCLINE HYCLATE 100 MG CAPS (DOXYCYCLINE HYCLATE) One cap by mouth two times a day x 7d  #14 x 0   Entered and Authorized by:   Rodney Langton MD   Signed by:    Rodney Langton MD on 06/16/2010   Method used:   Print then Give to Patient   RxID:   [id number] VENTOLIN HFA 108 (90 BASE) MCG/ACT AERS (ALBUTEROL SULFATE) One puff q4h as needed for SOB/wheeze  #2 x 0   Entered and Authorized by:   Rodney Langton MD   Signed by:   Rodney Langton MD on 06/16/2010   Method used:   Print then Give to Patient   RxID:   [id number]    Medication Administration  Injection # 1:    Medication: Solumedrol up to 125mg     Diagnosis: ASTHMA NOS W/ACUTE EXACERBATION (QVZ-563.87)    Route: IM    Site: RUOQ gluteus    Exp Date: 01/19/2013    Lot #: [id number]  Mfr: pfizer    Comments: split RUOQ & LUOQ gluteus    Patient tolerated injection without complications    Given by: Jimmy Footman, CMA (June 16, 2010 12:24 PM)  Orders Added: 1)  PeakFlow- [facility] [94150] 2)  [facility]- Est  Level 4 [91478] 3)  Solumedrol up to 125mg  [J2930]

## 2010-09-20 NOTE — Letter (Signed)
Summary: Out of Work  [name]  [address]   [place], [place] [address]   Phone: [phone]  Fax: [phone]    August 30, 2009   Employee:  [name]    To Whom It May Concern:   For Medical reasons, please excuse the above named employee from work for the following dates:  Start:   08/30/2009  If you need additional information, please feel free to contact our office.         Sincerely,    Pearlean Brownie MD

## 2010-09-20 NOTE — Progress Notes (Signed)
Summary: Triage call  Phone Note Call from Patient   Caller: Patient Call For: [phone] Summary of Call: ?Bladder infection since yesterday.  Need to be checked Initial call taken by: Abundio Miu,  June 30, 2010 3:54 PM  Follow-up for Phone Call        Dysuria and dark urine.  Also having a little back pain.  Gave her a [place] appt for tomorrow. Follow-up by: Dennison Nancy RN,  June 30, 2010 4:42 PM

## 2010-09-20 NOTE — Letter (Signed)
Summary: Handout Printed  Printed Handout:  - Asthma, Acute Bronchospasm

## 2010-09-20 NOTE — Progress Notes (Signed)
Summary: triage  Phone Note Call from Patient Call back at Home Phone [phone]   Caller: Patient Summary of Call: pt has leg swelling esp. in feet- painful Initial call taken by: De Nurse,  May 23, 2010 9:22 AM  Follow-up for Phone Call        states thi shas never happened before. asked her to come in now. she will be here in 30 minutes. placed in work in Follow-up by: Golden Circle RN,  May 23, 2010 9:25 AM

## 2010-09-20 NOTE — Miscellaneous (Signed)
  Clinical Lists Changes  Problems: Removed problem of ACUTE CYSTITIS (ICD-595.0) Removed problem of STREPTOCOCCAL PHARYNGITIS (ICD-034.0) Removed problem of SORE THROAT (ICD-462) Removed problem of WELL WOMAN (ICD-V70.0) Removed problem of SCREENING FOR UNSPECIFIED MALIGNANT NEOPLASM (ICD-V76.9) Removed problem of FATIGUE (ICD-780.79)

## 2010-09-20 NOTE — Progress Notes (Signed)
Summary: needs meds  Phone Note Call from Patient Call back at Home Phone [phone]   Caller: Patient Summary of Call: was here on Friday and states that she does need something for yeast inf - Out Pt Pharm Initial call taken by: De Nurse,  July 04, 2010 4:25 PM  Follow-up for Phone Call        Diflucan pill sent in Follow-up by: Milinda Antis MD,  July 04, 2010 9:58 PM    New/Updated Medications: FLUCONAZOLE 150 MG TABS (FLUCONAZOLE) 1 by mouth x 1 for yeast infection, may repeat in 3 days Prescriptions: FLUCONAZOLE 150 MG TABS (FLUCONAZOLE) 1 by mouth x 1 for yeast infection, may repeat in 3 days  #2 x 1   Entered and Authorized by:   Milinda Antis MD   Signed by:   Milinda Antis MD on 07/04/2010   Method used:   Electronically to        [facility]* (retail)       [address]       [address] Shipping/mailing       [place], [place]  [address]       Ph: [phone]       Fax: [phone]   RxID:   [id number]

## 2010-09-20 NOTE — Assessment & Plan Note (Signed)
Summary: uti/[place]/smith   Vital Signs:  Patient profile:   35 year old female Weight:      210.7 pounds BMI:     36.30 Temp:     98.5 degrees F oral Pulse rate:   82 / minute BP sitting:   122 / 81  (right arm) Cuff size:   regular  Vitals Entered By: Jimmy Footman, CMA (February 14, 2010 9:59 AM) CC: abdominal pain x2 days. Pressure while urinating Is Patient Diabetic? No Pain Assessment Patient in pain? yes     Location: abdomen Intensity: 7 Type: aching   Primary Care Provider:  Antoine Primas DO  CC:  abdominal pain x2 days. Pressure while urinating.  History of Present Illness: Donna Carlson comes in today for suprapubic pressure since late Saturday night.  Also experiencing "pressure" before and during urination.  No burning or stinging.  No fevers.  No n/v/d/c.  Never had a UTI before.  Has started a new job and feels like she isn't drinking as much as usual.   Habits & Providers  Alcohol-Tobacco-Diet     Tobacco Status: never  Allergies: 1)  ! * Shellfish  Physical Exam  General:  obese, alert ,NAD vitals reviewed Lungs:  Normal respiratory effort, chest expands symmetrically. Lungs are clear to auscultation, no crackles or wheezes. Heart:  Normal rate and regular rhythm. S1 and S2 normal without gallop, murmur, click, rub or other extra sounds. Abdomen:  Bowel sounds positive,abdomen soft and non-tender without masses, organomegaly or hernias noted.   Impression & Recommendations:  Problem # 1:  ABDOMINAL PAIN OTHER SPECIFIED SITE (ICD-789.09) Assessment New  Suprapubic.  UA negative.  Exam benign.  Unclear etiology.  Discussed warnign signs to return or to return if not better in 2-3 days.  Patient expressed understanding.   Orders: [facility]- Est Level  3 ([address])  Complete Medication List: 1)  Albuterol Sulfate (5 Mg/ml) 0.5% Nebu (Albuterol sulfate) .... Inhale 1 ml as directed four times a day - 1 box 2)  Flovent Hfa 220 Mcg/act Aero (Fluticasone propionate   hfa) .... Inhale 1 puff using inhaler twice a day 3)  Proair Hfa 108 (90 Base) Mcg/act Aers (Albuterol sulfate) .... Two puffs every four hours as needed for wheezing 4)  Valtrex 500 Mg Tabs (Valacyclovir hcl) .... One by mouth two times a day as needed  Other Orders: Urinalysis-[facility] (00000)  Laboratory Results   Urine Tests  Date/Time Received: February 14, 2010 10:17 AM  Date/Time Reported: February 14, 2010 10:43 AM  Routine Urinalysis   Color: yellow Appearance: Clear Glucose: negative   (Normal Range: Negative) Bilirubin: negative   (Normal Range: Negative) Ketone: negative   (Normal Range: Negative) Spec. Gravity: 1.025   (Normal Range: 1.003-1.035) Blood: negative   (Normal Range: Negative) pH: 6.0   (Normal Range: 5.0-8.0) Protein: trace   (Normal Range: Negative) Urobilinogen: 0.2   (Normal Range: 0-1) Nitrite: negative   (Normal Range: Negative) Leukocyte Esterace: negative   (Normal Range: Negative)    Comments: test performed by Loralee Pacas CMA

## 2010-09-20 NOTE — Miscellaneous (Signed)
Summary: re: appt  Clinical Lists Changes called pt lmvm to return call. pt has appt today for MRI 05/24/10 @ 11:15  [place] [facility] [address]Robert Busick CMA  May 24, 2010 9:09 AM  attemted to call pt again. no answer. called pt this am and explained that I would be scheduling an MRI for her today and would call right back with the appt time.Molly Maduro [facility] CMA  May 24, 2010 10:09 AM  pt never returned call. appt was at 11:15.Molly Maduro [facility] CMA  May 24, 2010 12:27 PM   Appended Document: re: appt pt returned call, told her that her appt was at 11:15 this am and she would have to call and reschedule it.

## 2010-10-04 ENCOUNTER — Ambulatory Visit: Payer: Self-pay | Admitting: Family Medicine

## 2010-10-11 ENCOUNTER — Encounter: Payer: Self-pay | Admitting: Family Medicine

## 2010-10-11 ENCOUNTER — Ambulatory Visit (INDEPENDENT_AMBULATORY_CARE_PROVIDER_SITE_OTHER): Payer: Commercial Managed Care - PPO | Admitting: Family Medicine

## 2010-10-11 ENCOUNTER — Other Ambulatory Visit: Payer: Self-pay | Admitting: Family Medicine

## 2010-10-11 VITALS — BP 125/85 | HR 84 | Ht 63.0 in | Wt 210.7 lb

## 2010-10-11 DIAGNOSIS — Z124 Encounter for screening for malignant neoplasm of cervix: Secondary | ICD-10-CM

## 2010-10-11 DIAGNOSIS — L918 Other hypertrophic disorders of the skin: Secondary | ICD-10-CM | POA: Insufficient documentation

## 2010-10-11 DIAGNOSIS — L909 Atrophic disorder of skin, unspecified: Secondary | ICD-10-CM

## 2010-10-11 DIAGNOSIS — N760 Acute vaginitis: Secondary | ICD-10-CM

## 2010-10-11 DIAGNOSIS — J45901 Unspecified asthma with (acute) exacerbation: Secondary | ICD-10-CM

## 2010-10-11 LAB — CONVERTED CEMR LAB
Chlamydia, DNA Probe: NEGATIVE
GC Probe Amp, Genital: NEGATIVE

## 2010-10-11 MED ORDER — FLUTICASONE-SALMETEROL 500-50 MCG/DOSE IN AEPB: 1.0000 | INHALATION_SPRAY | Freq: Two times a day (BID) | RESPIRATORY_TRACT | Status: DC

## 2010-10-11 MED ORDER — PREDNISONE 20 MG PO TABS: 20.0000 mg | ORAL_TABLET | Freq: Two times a day (BID) | ORAL | Status: AC

## 2010-10-11 MED ORDER — VALACYCLOVIR HCL 500 MG PO TABS: 500.0000 mg | ORAL_TABLET | Freq: Two times a day (BID) | ORAL | Status: DC

## 2010-10-11 NOTE — Assessment & Plan Note (Signed)
Add prednisone burst, change from inhaled Flovent to Advair 500/50, if still having night time symptoms instructed to see pharmacy clinic for PFTs

## 2010-10-11 NOTE — Progress Notes (Signed)
  Subjective:    Patient ID: Donna Carlson, female    DOB: November 08, 1975, 35 y.o.   MRN: [id number]  HPI: Here for yearly PAP and would like STD testing.  LMP was 2 months ago, has irregular periods, had BTL.    Asthma has been poorly controlled even though she is using Flovent regularly, she has been using albuterol multiple times a day and requiring the nebulizer at night.  She has not had a recent infection, there has been no change in her environment.  She has a long skin tag above her left eye that she has pulled off in the past and it has returned. She would like it to be removed.    Review of Systems  Constitutional: Negative for activity change, appetite change and unexpected weight change.  HENT: Negative for congestion and postnasal drip.   Respiratory: Positive for shortness of breath and wheezing.   Cardiovascular: Negative for chest pain and leg swelling.  Genitourinary: Positive for vaginal discharge. Negative for dysuria.       Objective:   Physical Exam  Constitutional: She is oriented to person, place, and time. She appears well-developed and well-nourished.  HENT:  Right Ear: External ear normal.  Left Ear: External ear normal.  Nose: Nose normal.  Mouth/Throat: Oropharynx is clear and moist.  Eyes: Conjunctivae and EOM are normal. Pupils are equal, round, and reactive to light.  Neck: Normal range of motion. Neck supple. No thyromegaly present.  Cardiovascular: Normal rate, regular rhythm and normal heart sounds.   Pulmonary/Chest: Effort normal. She has wheezes.  Abdominal: Soft. Bowel sounds are normal.  Genitourinary: Vagina normal and uterus normal. No vaginal discharge found.  Lymphadenopathy:    She has no cervical adenopathy.  Neurological: She is alert and oriented to person, place, and time. She has normal reflexes.  Skin:       Scarring on face from cystic acne.  Long skin tag above left eye  Psychiatric: She has a normal mood and affect.            Assessment & Plan:

## 2010-10-11 NOTE — Assessment & Plan Note (Signed)
Cut off at the base and sealed with silver nitrate, patient was very anxious.

## 2010-10-11 NOTE — Patient Instructions (Signed)
Prednisone for 5 days Change from inhaled steroid to inhaled Advair ( this will give your more steroid and add a long acting dilator) Always rinse you mouth with water and swallow Consider changing diet at home to improve health of family Let your area on your face dry up and fall off, do not put ointment or cream on it.  Return for any problems If you continue to have night time symptoms of your asthma please make an apt with our pharmacist Dr. Raymondo Band for pulmonary function testing.

## 2010-10-14 ENCOUNTER — Encounter: Payer: Self-pay | Admitting: Family Medicine

## 2010-12-19 ENCOUNTER — Other Ambulatory Visit: Payer: Self-pay | Admitting: Sports Medicine

## 2010-12-19 NOTE — Telephone Encounter (Signed)
Refill request

## 2010-12-27 ENCOUNTER — Encounter: Payer: Self-pay | Admitting: Family Medicine

## 2011-01-06 NOTE — Discharge Summary (Signed)
[place]  Patient:    Donna Carlson, Donna Carlson                   MRN: [id number] Adm. Date:  [id number] Disc. Date: [id number] Attending:  Tobin Chad Dictator:   Talmage Nap, M.D. CC:         Deniece Ree, M.D.             Talmage Nap, M.D.                           Discharge Summary  DISCHARGE DIAGNOSES: 1. Right middle lobe pneumonia. 2. Asthma exacerbation. 3. Intrauterine pregnancy at 24 weeks.  DISCHARGE MEDICATIONS: 1. Prenatal vitamins q.d. 2. Azithromycin 250 mg p.o. q.d. x 5 days. 3. Flovent 220 mcg inhaler two puffs b.i.d. 4. Albuterol inhaler two puffs every 6 hours as needed. 5. Prednisone 20 mg two tablets daily x 2 days then one tablet daily x 4 days,    then stop. 6. Robitussin cough medicine as needed.  FOLLOW-UP APPOINTMENTS: 1. Patient has an appointment with Dr. Wiliam Ke for OB follow-up. 2. Patient has an ultrasound on February 08, 2000, at 7:00 a.m. at [facility]. 3. Patient has an appointment on February 09, 2000, at 9:30 with Dr. Jolinda Croak at    [facility].  HOSPITAL COURSE: #1 - PNEUMONIA:  Patient is a very pleasant 35 year old, G3, P2-0-0-2 at 23 weeks and four days who was seen at [facility] in respiratory distress. At that time, a chest x-ray was obtained revealing a right middle lobe pneumonia.  Patient also had bronchospasm associated with her underlying asthma.  Pneumonia was treated with IV Rocephin and IV azithromycin x 2 days. Patient was slow to improve but was changed to p.o. azithromycin on hospital day #3.  She did require five days of IV Rocephin and p.o. azithromycin.  She will be discontinued on p.o. azithromycin 250 mg q.d. x 5 days.  Patient was afebrile throughout her hospital stay and a repeat chest x-ray was not obtained secondary to her pregnancy.  #2 - ASTHMA EXACERBATION:  Patient does have underlying asthma since childhood and has been treating  herself with albuterol inhaler as needed.  She has had one prior exacerbation requiring hospitalization also associated with pneumonia.  She was in moderate to severe respiratory distress at [facility] and was transferred here.  She originally was given IV Solu-Medrol which was changed on hospital day #2 to 60 mg of p.o. prednisone.  She was given albuterol and Atrovent nebulizers.  The albuterol nebulizers were continued q.2h. x 24 hours and then were spaced to q.6h. upon discharge.  She was then changed to her albuterol MDI.  Given the fact that patient has had asthma exacerbations requiring hospitalization, it was thought that she likely needs maintenance medication, and was started on Flovent 220 mcg inhaler two puffs b.i.d.  She should continue this given the fact that she does have mild to moderate persistent asthma.  Patient also was started on a prednisone taper upon discharge and will be tapered over the next six days.  Patient will have follow-up at [facility] with Dr. Jolinda Croak in one week.  #3 - INTRAUTERINE PREGNANCY:  Patient is currently 24 weeks at discharge.  She has been receiving her obstetrical care by Dr. Wiliam Ke.  She was scheduled for an ultrasound on the day of discharge but was unable to  make it there in time. Her ultrasound was rescheduled for February 08, 2000.  She does have appropriate follow-up with Dr. Wiliam Ke.  Of note, patient was given a significant amount of steroids and likely will have an abnormal glucola at discharge.  DISCHARGE DISPOSITION:  Patient was discharged in stable disposition.  She continued to have mild wheezing but had no shortness of breath at rest or with exertion.  Her O2 saturations were 100% on room air.  She was afebrile taking normal p.o..  She will be discharged with follow-up at [facility], Dr. Holley Raring office, and an OB ultrasound follow-up. DD:  02/02/00 TD:  02/06/00 Job: [id number]

## 2011-01-06 NOTE — Op Note (Signed)
Donna Carlson, Donna Carlson            ACCOUNT NO.:  [id number]   MEDICAL RECORD NO.:  [id number]          PATIENT TYPE:  INP   LOCATION:  9137                          FACILITY:  [facility]   PHYSICIAN:  Crist Fat. Rivard, M.D. DATE OF BIRTH:  1975-10-05   DATE OF PROCEDURE:  06/25/2006  DATE OF DISCHARGE:                                 OPERATIVE REPORT   PREOPERATIVE DIAGNOSIS:  Desire for sterilization.   POSTOPERATIVE DIAGNOSIS:  Desire for sterilization.   ANESTHESIA:  General.   PROCEDURE:  Postpartum bilateral tubal ligation.   SURGEON:  Crist Fat. Rivard, M.D.   ESTIMATED BLOOD LOSS:  Minimal.   PROCEDURE:  After being informed of the planned procedure with possible  complications including bleeding, infection, injury to other organs,  irreversibility and failure rate of 1000, informed consent is obtained.  The  patient is taken to OR #8, given general anesthesia with endotracheal  intubation without complication.  She is placed in a dorsal decubitus  position, prepped and draped in a sterile fashion.  Her umbilical area is  then infiltrated with 10 mL of Marcaine 0.25% and we perform a  semielliptical incision with knife, which is brought down sharply to the  fascia.  The fascia is identified, grasped and opened and the peritoneum is  entered bluntly.  We then proceed in the exact same fashion on both sides,  identifying each tube, which is grasped with Babcock forceps, exteriorized  until fimbriae are identified visually.  Then the mesosalpinx is entered  with electrocautery and we doubly ligate each stump with a 0 chromic.  A  section of 1.5 cm of the isthmic-ampullary tube is then excised and each  stump is then cauterized.  Hemostasis is checked and adequate.  Please note,  on the left side a small bleeding from the fimbrial end when we exteriorize  was cauterized and rechecked at the end of the procedure and dry.   We then close fascia with a running suture of 0 Vicryl and  skin with a  subcuticular suture of 3-0 Monocryl.   Instrument and sponge count is complete x2.  Estimated blood loss is  minimal.  The procedure is very well-tolerated by the patient, who is taken  to recovery room in a well and stable condition.      Crist Fat Rivard, M.D.  Electronically Signed     SAR/MEDQ  D:  06/25/2006  T:  06/26/2006  Job:  [id number]

## 2011-01-06 NOTE — H&P (Signed)
Donna Carlson, Donna Carlson            ACCOUNT NO.:  [id number]   MEDICAL RECORD NO.:  [id number]          PATIENT TYPE:  INP   LOCATION:  9137                          FACILITY:  [facility]   PHYSICIAN:  Janine Limbo, M.D.DATE OF BIRTH:  [birth year]   DATE OF ADMISSION:  06/24/2006  DATE OF DISCHARGE:                                HISTORY & PHYSICAL   HISTORY OF PRESENT ILLNESS:  Donna Carlson is a 35 year old gravida 5, para 3-  0-1-3 at 38-2/7 weeks.  EDD 06/2006 as determined by first trimester  ultrasound and confirmed with follow-up.  She presents for induction of  labor with proteinuria, polyhydramnios and LGA.  Her blood pressures have  remained normal with normal PIH labs.  No complaints of PIH symptoms.  No  headache, visual changes or epigastric pain.  She reports positive fetal  movement, irregular contractions, no vaginal bleeding, no rupture of  membranes.  Her pregnancy has been followed by the MD service at [facility] and  remarkable for:  1. PIH with a first pregnancy.  2. History of abnormal Pap smear.  3. Asthma.  4. Depression.  5. HSV-II with no prodrome and no lesions.  She is currently taking      Valtrex prophylactically  6. Group B strep positive.   This patient entered prenatal care at the office of [facility] on November 30, 2005,  at approximately [redacted] weeks gestation, [facility] determined by early pregnancy  ultrasound at 10 weeks, 1 day and confirmed with follow-up.  Her pregnancy  has been complicated with severe asthma.  She takes Singulair, Pulmicort and  albuterol daily, as well as, frequent urinary tract infections and  proteinuria.  She has had an elevated One-hour glucose and a normal 3-hour  glucose tolerance test and in the third trimester, she has been found to  have LGA with polyhydramnios.  She had antenatal testing with either  biweekly NST's or BPP's in the third trimester which have all been normal.  At 32 weeks, she was found to have a LGA was greater than the  95th  percentile and polyhydramnios at the 97%.   OB HISTORY:  In 1994, the patient had a spontaneous vaginal delivery at 56-  37 weeks with the birth of a 5 pounds 10 ounces female infant named  Donna Carlson. In 1999, the patient had a spontaneous vaginal delivery at 42  weeks with the birth of an 8 pounds 2 ounces female infant named Donna Carlson.  In 2001, the patient had a spontaneous vaginal delivery at 39 weeks with the  birth of 8 pounds 5 ounces female infant named Donna Carlson.  In 2003, the patient  had an elective AB with no complications.  This is her fifth and current  pregnancy.   PRENATAL LAB:  Work on December 14, 2005, hemoglobin/hematocrit 11.7 and 35.1,  platelets 238,000.  Blood type and Rh A positive, antibody screen negative,  sickle cell trait negative, VDRL nonreactive, rubella immune, hepatitis B  surface antigen negative, HIV nonreactive.  Pap smear in December 14, 2005,  within normal limits.  CF testing negative.  At 28 weeks, one-hour glucose  challenge elevated, 3-hour GTT normal.  The patient is noted to have  positive group B strep in her urine.   MEDICAL HISTORY:  1. Asthma.  2. HSV-II.  3. Depression.  4. Periodontal disease.   CURRENT MEDICATIONS:  1. Valtrex 500 mg p.o. q.h.s.  2. Singulair 10 mg p.o. q.h.s.  3. Pulmicort one puff inhaler q.h.s.  4. Albuterol inhaler p.r.n.  5. Albuterol nebulizers p.r.n.   FAMILY HISTORY:  The patient's mother with a history of leukemia.  Maternal  grandmother chronic hypertension.  Maternal grandmother diabetes.   SURGICAL HISTORY:  1. Gallstone.  2. Cholecystectomy in the year 2000.   GENETIC HISTORY:  There is no genetic history of familial or chromosomal  disorders, any children that were born with birth defects or any that died  in infancy.   ALLERGIES:  The patient has no known drug allergies.   HABITS:  She denies the use of tobacco, alcohol or illicit drugs.   SOCIAL HISTORY:  Ms. Skibinski is a single African  American female.  She works  as a Geographical information systems officer.  The father of her baby, Viann Shove, is involved  and supportive.  He works as a Scientist, forensic.  They do not subscribe to a religious  faith.   REVIEW OF SYSTEMS:  As described above.  The patient is a 38-2/7 weeks with  proteinuria, LGA and polyhydramnios.   PHYSICAL EXAMINATION:  VITAL SIGNS:  Stable.  The patient is afebrile.  HEENT is unremarkable.  HEART:  Regular rate and rhythm.  LUNGS:  Clear.  ABDOMEN:  Soft and nontender.  It is gravid.  It is contouring.  Uterine  fundus is noted to extend 40 cm above the level of the pubic symphysis.  Leopold's maneuvers finds the infant to be in longitudinal lie, cephalic  presentation.  The baseline of the fetal heart rate monitor is 140-158 with  average long-term variability.  Reactivity is present with no periodic  changes.  The patient is contracting irregularly.  Visual examination of the  external genitalia shows no HSV lesions noted.  Exam is within normal  limits.  Internal exam shows no HSV lesions noted.  Digital exam of the  cervix finds it to be 1-2 cm dilated, 50% effaced with the vertex high at -3  station.  EXTREMITIES: Showed 1+ edema.  DTR's are 1+ with no clonus.  There  is no calf tenderness noted bilaterally.   ASSESSMENT:  Intrauterine pregnancy at 38-2/7 weeks, proteinuria,  polyhydramnios and large for gestational age.  Admit per Dr. Marline Backbone.  Begin high dose Pitocin per protocol and start penicillin G  prophylaxis for positive group B strep.      Rica Koyanagi, C.N.M.      Janine Limbo, M.D.  Electronically Signed    SDM/MEDQ  D:  06/24/2006  T:  06/25/2006  Job:  [id number]

## 2011-01-06 NOTE — H&P (Signed)
[place]  Patient:    Donna Carlson, Donna Carlson                   MRN: [id number] Adm. Date:  [id number] Disc. Date: [id number] Attending:  Sandi Raveling Dictator:   Gwenlyn Perking, M.D. CC:         Estill Batten. Deirdre Priest, M.D.             Deniece Ree, M.D.                         History and Physical  PRIMARY CARE Michelina Mexicano:  Gaynell Face L. Deirdre Priest, M.D.  OB/GYN:  Deniece Ree, M.D.  PROBLEM LIST: 1. Asthma exacerbation. 2. Right middle lobe pneumonia. 3. Intrauterine pregnancy at 23 weeks and 4 days.  CHIEF COMPLAINT:  Shortness of breath.  HISTORY OF PRESENT ILLNESS:  The patient is a 35 year old G3, P2-0-0-2 at 23 weeks and 4 days of intrauterine pregnancy who presented to the maternity admission unit on the morning of January 29, 2000 with a chief complaint of shortness of breath, worsening over the past few days or so.  She had been managing her asthma with albuterol MDI, only with moderate success, but began to have a cough productive of green sputum about 2-3 days ago and, also, had some issues with nasal congestion.  Over the night from June 9 to January 29, 2000, her shortness of breath worsened.  She presented to the maternity admissions unit at [facility], at which point she was transferred to [facility] Emergency Department after IV steroids an albuterol nebulizers had been provided to the patient there.  She is only slightly better.  She also had some problems with chills.  However, no fevers as reported by the patient.  REVIEW OF SYSTEMS:  Negative for fever, headaches, blurred vision, or any other visual changes or neck stiffness, chest pain or abdominal pain.  The patient also denies contractions.  She denies any vaginal discharge or dysuria.  No chills.  PAST MEDICAL HISTORY:  Significant for asthma controlled with albuterol only. On previous admission for asthma exacerbation/pneumonia in September 1999. The  patient has never been intubated and has never been in the intensive care unit for her asthma.  PAST OBSTETRIC HISTORY:  She is a G3, P2-0-0-2.  One pregnancy, one post dates.  No complications, however.  FAMILY HISTORY:  Positive for leukemia in the mother.  There is also a family history of hypertension.  ALLERGIES:  No known drug allergies.  MEDICATIONS:  As above.  Albuterol MDI on a p.r.n. basis.  PHYSICAL EXAMINATION:  VITAL SIGNS:  Temperature 98.7, blood pressure 116-123/65-70, pulse 109-123, O2 saturations 91% to 96% on room air.  Peak flow here, best out of three was 220.  According to her by history, her best is over 500.  Fetal heart rate is 140s, which was assessed at [facility].  GENERAL:  Well-developed, well-nourished 35 year old African-American female. She is alert and oriented and in no apparent distress except for mild respiratory distress.  The patient does have some difficulty in completing full sentences, and has to pause when answering questions.  HEENT:  Normocephalic and atraumatic.  Pupils equal, round and reactive to light and accommodation.  Extraocular movements intact.  Tympanic membranes within normal limits bilaterally.  Oropharynx pink and moist with no exudate.  NECK:  Supple.  No lymphadenopathy.  LUNGS:  Right sided crackles.  No wheezes appreciated.  HEART:  Regular rate and rhythm.  ABDOMEN:  Gravid and nontender with positive bowel sounds.  BACK:  No costovertebral angle tenderness.  EXTREMITIES:  Good distal pulses.  No edema.  Capillary refill less than 2 seconds.  LABORATORY DATA:  Chest x-ray reveals a right middle lobe infiltrate.  CBC shows white blood cell count 10.2, hemoglobin 11.3, platelets 218.  ASSESSMENT:  A 35 year old G3, P2-0-0-2 at 23 weeks and 4 days intrauterine pregnancy with a one week history of worsening shortness of breath and increased productive cough.  PLAN: 1. Asthma exacerbation - Will admit  the patient for IV steroids, albuterol and    Atrovent nebulizers.  Will check pre and post treatment peak flows and    continue pulse oximetry. 2. Right middle lobe infiltrate, likely pneumonia which led to asthma    exacerbation - Will start IV Rocephin and azithromycin and observe. 3. Intrauterine pregnancy - Will monitor fetal heart rate daily.  After this    exacerbation, will refer her back to her primary OB, Dr. Wiliam Ke for further    assessment. DD:  01/29/00 TD:  01/30/00 Job: [id number]

## 2011-01-06 NOTE — Discharge Summary (Signed)
[place]. [facility]  Patient:    Donna Carlson, Donna Carlson Visit Number: [id number] MRN: [id number]          Service Type: MED Location: [phone] Attending Physician:  Doneta Public Dictated by:   Michell Heinrich, M.D. Admit Date:  12/09/2001 Disc. Date: 12/11/01                             Discharge Summary  ADMISSION DIAGNOSIS:  Acute asthma exacerbation.  DISCHARGE DIAGNOSIS:  Acute asthma exacerbation.  DISCHARGE MEDICATIONS: 1. Albuterol MDI two puffs every four to six hours as needed. 2. Flovent 220 mcg MDI two puffs twice daily. 3. Prednisone 20 mg tablet, three tablets daily for two days, then two tablets    daily for two days, and then one tablet daily for two days.  PROCEDURE:  None.  CONSULTING PHYSICIANS:  None.  HISTORY OF PRESENT ILLNESS:  For complete H&P, please see resident H&P in chart. Briefly, this is a 35 year old African-American female with a history of severe asthma exacerbations requiring hospitalization, who presented to [facility] with three days of worsening shortness of breath and cough unresponsive to albuterol.  Peak flows were approximately 50% of normal.  O2 saturation on room air was 97% and the remainder of her vitals were also stable.  She was in mild distress with some labored breathing, but had good air movement with diffuse wheezes without rales or rhonchi.  She was notably tachycardic in the 110 to 120 range upon admission.  Admission CBC showed a white blood cell count of 9.5 and hemoglobin 12.5, and normal platelet count.  Her basic metabolic panel was within normal limits.  Chest x-ray on admission showed no infiltrate and no hyperinflation or air trapping. In the clinic she was given a shot of Decadron and was given continuous nebulizers.  HOSPITAL COURSE:  She was admitted to telemetry bed and placed on q.2h. scheduled and q.1h. p.r.n. albuterol nebulizers.  She required  this frequent course of nebulizers for the first 12 hours of hospitalization.  She was changed to Zopenex secondary to heart rate in the 140s persistently (sinus tachycardia).  She began to show improvement at the end of hospital day #1 and her nebulizers were spaced out appropriately.  Recheck of potassium on the day prior to discharge was 4.0.  She was continued on oral steroids and on discharge she was arranged to have a steroid taper, continue on her inhaled steroid, and use albuterol q.4h. as needed.  DISPOSITION:  The patient was discharged home in improved condition with the follow-up appointment with Dr. Wilson Singer on December 16, 2001, at 11:50 a.m.  She was instructed to call her M.D. or return to the ED for any persistent shortness of breath which was responsive to albuterol.  She was also instructed to take her peak flows, record them, and bring them to her appointment with her doctor. Dictated by:   Michell Heinrich, M.D. Attending Physician:  Doneta Public DD:  12/11/01 TD:  12/11/01 Job: [id number]

## 2011-01-06 NOTE — Discharge Summary (Signed)
Donna Carlson, Donna Carlson            ACCOUNT NO.:  [id number]   MEDICAL RECORD NO.:  [id number]          PATIENT TYPE:  INP   LOCATION:  5015                         FACILITY:  [facility]   PHYSICIAN:  Santiago Bumpers. Hensel, M.D.DATE OF BIRTH:  Jan 31, 1976   DATE OF ADMISSION:  10/21/2005  DATE OF DISCHARGE:  10/23/2005                                 DISCHARGE SUMMARY   PRIMARY CARE PHYSICIAN:  Dr. Sharin Grave at the [facility].   DISCHARGE DIAGNOSES:  1.  Asthma exacerbation.  2.  Community-acquired pneumonia.   DISCHARGE MEDICATIONS:  1.  Prednisone 60 mg by mouth through October 25, 2005.  2.  Azithromycin 500 mg by mouth through October 25, 2005.  3.  Flovent 200 mg two puffs twice daily.  4.  Albuterol nebulizer every 2-4 hours as needed.   PROCEDURES:  Chest x-ray on October 21, 2005 showed atelectasis and  consolidation of right upper lobe consistent with pneumonia.   LABORATORY DATA:  Influenza A and B negative.  D-dimer 0.57.  CBC:  White  blood cell count 9.4, hemoglobin 12.3, hematocrit 37.7, platelet count  269,000.  Basic metabolic panel:  Sodium 136, potassium 4.3, chloride 108,  bicarb 24, BUN 12, creatinine 0.7, glucose 130, calcium 9.4.  Blood cultures  x2 were no growth to date at the time of discharge.   BRIEF HISTORY OF PRESENTATION:  The patient is a 35 year old female with a  past medical history of asthma, who presented to the ER with a 2-day history  of upper respiratory symptoms and increased shortness of breath and  wheezing.  The patient was seen at Urgent Care and diagnosed with a right  upper lobe pneumonia and asthma exacerbation, and presented to the ER for  admission.   HOSPITAL COURSE:  PROBLEM #1 - ASTHMA EXACERBATION:  The patient was started  on albuterol/Atrovent nebulizers every 4 hours and every 2 hours as needed.  The patient was given oxygen to maintain her oxygen saturations greater than  94%.  She was started on  prednisone 60 mg p.o. twice daily x5 days.  The  patient's breathing improved throughout her hospital course.  The patient  did not require any oxygen for greater than 24 hours at the time of  discharge.  She required albuterol nebulizer treatment less than every 4  hours at the time of discharge.  The patient is to complete her 5-day course  of prednisone as well as continue her Flovent inhaler two puffs twice daily.  The patient also is instructed to use her albuterol nebulizer as needed.  I  would recommend that the patient have her influenza shot and Pneumovax shot  as an outpatient if she has not already received these vaccinations.   PROBLEM #2 - PNEUMONIA:  The patient was febrile at the time of admission;  however, she remained afebrile throughout the rest of her hospital course.  The patient was started on azithromycin 500 mg p.o. daily for 5 days.  The  patient received 2 of the 5 doses while here in the hospital.  The patient  also received 2  doses of ceftriaxone 2 g IV while here in the hospital.  The  patient was afebrile for greater than 24 hours at the time of discharge and  will be discharged home on Azithromycin.   PROBLEM #3 - FLUIDS AND ELECTROLYTES:  The patient was initially started on  IV fluids.  On the morning after admission, the patient was tolerating a  regular diet and continued to do so throughout her hospital course.   PROBLEM #4 - PROPHYLAXIS:  The patient did receive Protonix as well as  Lovenox for DVT and GI prophylaxis while here in the hospital.   FOLLOWUP APPOINTMENT:  Followup appointment is with Dr. Tressia Danas at [facility], phone number [phone], on November 03, 2005 at  11:30 a.m.   DISCHARGE INSTRUCTIONS:  The patient was instructed to seek medical  attention for any worsening shortness of breath or respiratory distress.      Benn Moulder, M.D.    ______________________________  Santiago Bumpers. Leveda Anna, M.D.    MR/MEDQ   D:  10/23/2005  T:  10/24/2005  Job:  [id number]   cc:   Sharin Grave, MD  Fax: [phone]

## 2011-01-06 NOTE — H&P (Signed)
Donna, Carlson            ACCOUNT NO.:  [id number]   MEDICAL RECORD NO.:  [id number]          PATIENT TYPE:  EMS   LOCATION:  URG                          FACILITY:  [facility]   PHYSICIAN:  Donna Carlson, Donna CarlsonDATE OF BIRTH:  [birth year]   DATE OF ADMISSION:  10/21/2005  DATE OF DISCHARGE:                                HISTORY & PHYSICAL   ADMISSION DIAGNOSES:  1.  Intermittent asthma with exacerbation.  2.  Right upper lobe pneumonia.   CHIEF COMPLAINT:  Shortness of breath.   HISTORY OF PRESENT ILLNESS:  This is a 35 year old African American female  with a two day history of upper respiratory symptoms and increasing  shortness of breath/wheezing. The patient called the physician on call last  night with symptoms and complaints. Since then she has been using albuterol  nebulizers q.2h. with little relief. She was seen tonight at Urgent Care,  diagnosed with a right upper lobe pneumonia, and asthma exacerbation, and  was sent to [facility]. At Urgent Care peak flow was 130 initially on  arrival and oxygen saturation was 89% on room air. Status post nebulizer  treatment her peak flow increased to 160 with O2 saturations in the 90s on  two liters. The patient's normal peak flows were 450. The patient was also  found to be febrile at 101.6. The patient has a history of asthma and  pneumonia on hospitalization. The patient was not taking oral  contraceptives. The patient is not a smoker. Her shortness of breath has  been gradual, not sudden in onset. No associated chest pain.   REVIEW OF SYSTEMS:  Positive for fever and decreased p.o. Negative for chest  pain. Also positive for shortness of breath and wheezing. Negative for  nausea, vomiting, diarrhea, rash, dysuria.   MEDICATIONS:  Admission medications are albuterol p.r.n. and Flovent 220 mcg  MDI two puffs b.i.d.   ALLERGIES:  No known drug allergies.   PHYSICAL EXAMINATION:  VITAL SIGNS: Temperature currently  100.4 and T-max  101.6, heart rate 115, pressure 134/90, respirations 32, and saturation 94%  on room air.  GENERAL: The patient is an obese [place] American female in no acute  distress, alert and oriented times three.  HEENT: Extraocular muscles intact. Pupils equal, round, and reactive to  light. Dry mucous membranes.  LUNGS: Patient with mild tachypnea and respiratory wheezes.  CV: Mild tachycardia, status post nebulizers.  ABDOMEN: Soft, obese, nontender, nondistended.  EXTREMITIES: Without edema. Pulses are 2+ peripherally.  NEUROLOGIC: No focal deficits.   LABORATORY DATA:  So far influenza negative.   ASSESSMENT/PLAN:  This is a 35 year old female with intermittent asthma.  1.  Pneumonia, right upper lobe. Will treat with ceftriaxone and Zithromax.      Will check a CBC, with fever will also check blood cultures.  2.  Asthma exacerbation. The patient received albuterol and atrovent      nebulizers q.4h. p.r.n. as well as a prednisone burst of 60 mg p.o.      daily. Asthma exacerbation is likely secondary to #1.  3.  Tachycardia likely secondary to nebulizer treatment. Could also be  dehydration. Will check a D-dimer. The patient is low risk as she is not      a smoker and she is not on oral contraceptive pills, but if the D-dimer      is negative, will rule out PE; if positive not necessarily PE. Consider      further workup.  4.  Avascular necrosis. The patient with decrease p.o. times two to three      days. If she is dry,  will start normal saline at 100 mL an hour. Will      check a BMET and follow.      Donna Carlson, M.D.     MBV/MEDQ  D:  10/21/2005  T:  10/22/2005  Job:  [id number]

## 2011-01-06 NOTE — Discharge Summary (Signed)
NAMEIRANIA, DURELL            ACCOUNT NO.:  [id number]   MEDICAL RECORD NO.:  [id number]          PATIENT TYPE:  INP   LOCATION:  9137                          FACILITY:  [facility]   PHYSICIAN:  Osborn Coho, M.D.   DATE OF BIRTH:  Feb 24, 1976   DATE OF ADMISSION:  06/24/2006  DATE OF DISCHARGE:  06/26/2006                                 DISCHARGE SUMMARY   ADMITTING DIAGNOSES:  1. Intrauterine pregnancy at term.  2. Polyhydramnios.  3. Proteinuria.  4. Desires sterilization.   PROCEDURE:  1. Normal spontaneous vaginal delivery with repair of first-degree right      vaginal laceration.  2. Postpartum bilateral tubal ligation.   PHYSICIANS:  Delivery was performed by Dr. Marline Backbone.  Tubal was  performed by Dr. Dois Davenport Rivard.   DISCHARGE DIAGNOSES:  1. Intrauterine pregnancy at term delivered.  2. Polyhydramnios.  3. Proteinuria.  4. Normal spontaneous vaginal delivery.  5. Repair of first-degree right vaginal laceration.  6. Postpartum BTL.   HISTORY OF PRESENT ILLNESS:  Ms. Donna Carlson is a 35 year old gravida 5, para 3-  0-1-3 who presents at term for induction of labor secondary to  polyhydramnios and proteinuria.  She was begun with high dose Pitocin  stimulation of labor, and when she progressed to 4 cm, artificial rupture of  membranes was employed for clear fluid.  The patient then went on to normal  spontaneous vaginal delivery with the birth of a 7 pounds 10 ounces female  infant named Donna Carlson with Apgar scores of 9 at 1 minute and 9 at 5 minutes.  The patient has done well in the postpartum period.  Her hemoglobin on the  first postpartum day was 10.9.  She did undergo postpartum BTL and has done  well postoperatively.  Her vital signs remained stable.  She is afebrile.  Her umbilical incision is clean, dry and intact, and on this, postpartum day  #2, post first postoperative day, she is judged to be in satisfactory  condition for discharge.  Discharge  instructions per [facility]  handout.   DISCHARGE MEDICATIONS:  1. Motrin 600 mg p.o. q.6 h p.r.n. pain.  2. Tylox one to two p.o. q.3-4 h p.r.n. pain.  3. Prenatal vitamins.   DISCHARGE FOLLOW-UP:  Will be at [facility] in 6 weeks.      Rica Koyanagi, C.N.M.      Osborn Coho, M.D.  Electronically Signed    SDM/MEDQ  D:  06/26/2006  T:  06/26/2006  Job:  [id number]

## 2011-05-17 NOTE — Telephone Encounter (Signed)
This encounter was created in error - please disregard.

## 2011-08-04 ENCOUNTER — Ambulatory Visit: Payer: Commercial Managed Care - PPO | Admitting: Family Medicine

## 2011-08-10 ENCOUNTER — Encounter: Payer: Self-pay | Admitting: Family Medicine

## 2011-08-10 ENCOUNTER — Ambulatory Visit (INDEPENDENT_AMBULATORY_CARE_PROVIDER_SITE_OTHER): Payer: 59 | Admitting: Family Medicine

## 2011-08-10 VITALS — BP 134/83 | HR 61 | Temp 98.6°F | Ht 63.0 in | Wt 210.0 lb

## 2011-08-10 DIAGNOSIS — N898 Other specified noninflammatory disorders of vagina: Secondary | ICD-10-CM | POA: Insufficient documentation

## 2011-08-10 LAB — POCT WET PREP (WET MOUNT): Yeast Wet Prep HPF POC: NEGATIVE

## 2011-08-10 MED ORDER — METRONIDAZOLE 500 MG PO TABS: 500.0000 mg | ORAL_TABLET | Freq: Two times a day (BID) | ORAL | Status: AC

## 2011-08-10 NOTE — Patient Instructions (Signed)
Thank you for coming in today. You have trichomonas which is usually sexually transmitted but easily treated.  Take metronidazole twice a day for a week. Dont take with alcohol.  If you still have symptoms in 2 weeks come back.   Trichomoniasis Trichomoniasis is an infection, caused by the Trichomonas organism, that affects both women and men. In women, the outer female genitalia and the vagina are affected. In men, the penis is mainly affected, but the prostate and other reproductive organs can also be involved. Trichomoniasis is a sexually transmitted disease (STD) and is most often passed to another person through sexual contact. The majority of people who get trichomoniasis do so from a sexual encounter and are also at risk for other STDs. CAUSES    Sexual intercourse with an infected partner.     It can be present in swimming pools or hot tubs.  SYMPTOMS    Abnormal gray-green frothy vaginal discharge in women.     Vaginal itching and irritation in women.     Itching and irritation of the area outside the vagina in women.     Penile discharge with or without pain in males.     Inflammation of the urethra (urethritis), causing painful urination.     Bleeding after sexual intercourse.  RELATED COMPLICATIONS  Pelvic inflammatory disease.     Infection of the uterus (endometritis).     Infertility.    Tubal (ectopic) pregnancy.     It can be associated with other STDs, including gonorrhea and chlamydia, hepatitis B, and HIV.  COMPLICATIONS DURING PREGNANCY  Early (premature) delivery.     Premature rupture of the membranes (PROM).     Low birth weight.  DIAGNOSIS    Visualization of Trichomonas under the microscope from the vagina discharge.     Ph of the vagina greater than 4.5, tested with a test tape.     Trich Rapid Test.     Culture of the organism, but this is not usually needed.     It may be found on a Pap test.     Having a "strawberry cervix,"which  means the cervix looks very red like a strawberry.  TREATMENT    You may be given medication to fight the infection. Inform your caregiver if you could be or are pregnant. Some medications used to treat the infection should not be taken during pregnancy.     Over-the-counter medications or creams to decrease itching or irritation may be recommended.     Your sexual partner will need to be treated if infected.  HOME CARE INSTRUCTIONS    Take all medication prescribed by your caregiver.     Take over-the-counter medication for itching or irritation as directed by your caregiver.     Do not have sexual intercourse while you have the infection.     Do not douche or wear tampons.     Discuss your infection with your partner, as your partner may have acquired the infection from you. Or, your partner may have been the person who transmitted the infection to you.     Have your sex partner examined and treated if necessary.     Practice safe, informed, and protected sex.     See your caregiver for other STD testing.  SEEK MEDICAL CARE IF:    You still have symptoms after you finish the medication.     You have an oral temperature above 102 F (38.9 C).     You develop belly (  abdominal) pain.     You have pain when you urinate.     You have bleeding after sexual intercourse.     You develop a rash.     The medication makes you sick or makes you throw up (vomit).  Document Released: 01/31/2001 Document Revised: 04/19/2011 Document Reviewed: 02/26/2009 [facility] Patient Information 2012 [place], [place].

## 2011-08-10 NOTE — Progress Notes (Signed)
Donna Carlson is a 36 year old woman who presents with 8 days of vaginal discharge and itching. She attempted self treatment with a 3 day over-the-counter yeast infection treatment which helped maybe a little. However she still has symptoms and started a seven-day treatment which isn't helping.  She denies any pain fevers chills vomiting diarrhea. She does note that she started having menstrual bleeding yesterday. She doesn't want other STI testing today as she was tested in February.  She feels well otherwise.  PMH reviewed.  ROS as above otherwise neg Medications reviewed. Current Outpatient Prescriptions  Medication Sig Dispense Refill  . albuterol (PROVENTIL) (5 MG/ML) 0.5% nebulizer solution Take 2.5 mg by nebulization 4 (four) times daily.        . Fluticasone-Salmeterol (ADVAIR DISKUS) 500-50 MCG/DOSE AEPB Inhale 1 puff into the lungs 2 (two) times daily.  1 each  6  . VENTOLIN HFA 108 (90 BASE) MCG/ACT inhaler USE 1 PUFF BY MOUTH EVERY 4 HOURS AS NEEDED FOR SHORTNESS OF BREATH/WHEEZE  36 each  PRN  . metroNIDAZOLE (FLAGYL) 500 MG tablet Take 1 tablet (500 mg total) by mouth 2 (two) times daily.  14 tablet  0  . valACYclovir (VALTREX) 500 MG tablet Take 1 tablet (500 mg total) by mouth 2 (two) times daily.  14 tablet  3    Exam:  BP 134/83  Pulse 61  Temp(Src) 98.6 F (37 C) (Oral)  Ht 5\' 3"  (1.6 m)  Wt 210 lb (95.255 kg)  BMI 37.20 kg/m2 Gen: Well NAD Lungs: CTABL Nl WOB Heart: RRR no MRG Abd: NABS, NT, ND GYN: Normal external genitalia. Menstrual blood pooling in the posterior fornix. Normal cervix. Nontender.  Wet prep results: Trichomonas

## 2011-08-10 NOTE — Assessment & Plan Note (Signed)
Positive for trichomonas. Patient refused STI testing.  Plan for Flagyl twice daily for one week. If not much improved will return to clinic in 2 weeks for further STI testing. Recommend that her partner also be treated.

## 2011-08-24 ENCOUNTER — Ambulatory Visit (INDEPENDENT_AMBULATORY_CARE_PROVIDER_SITE_OTHER): Payer: 59 | Admitting: Family Medicine

## 2011-08-24 ENCOUNTER — Encounter: Payer: Self-pay | Admitting: Family Medicine

## 2011-08-24 VITALS — BP 121/82 | HR 88 | Ht 64.0 in | Wt 210.0 lb

## 2011-08-24 DIAGNOSIS — N898 Other specified noninflammatory disorders of vagina: Secondary | ICD-10-CM

## 2011-08-24 LAB — POCT WET PREP (WET MOUNT): Trichomonas Wet Prep HPF POC: NEGATIVE

## 2011-08-24 LAB — RPR

## 2011-08-24 MED ORDER — FLUCONAZOLE 150 MG PO TABS: 150.0000 mg | ORAL_TABLET | Freq: Once | ORAL | Status: AC

## 2011-08-24 NOTE — Progress Notes (Signed)
  Subjective:    Patient ID: Donna Carlson, female    DOB: [birth year], 36 y.o.   MRN: [id number]  HPI Follow-up of Trich: Diagnosed 12/20 here in clinic. Finished metronidazole. Reports partner also treated. Here because of persistent yellowish discharge and odor, although both have improved with the medication.  Review of Systems Denies abdominal pain Denies dysuria Denies fevers Denies vaginal irritation/itchiness    Objective:   Physical Exam Gen: NAD Psych: pleasant, engaged, appropriate Abd: non-tender GU:    Vulva: normal   Vagina: some erythema   Cervix: some thin white discharge posterior fornix; no cervical motion tenderness; cervix normal, non-friable    Assessment & Plan:  Will repeat wet prep, check GC/chlamydia, and HIV/RPR.  Will call patient with results.

## 2011-08-24 NOTE — Patient Instructions (Signed)
I will call you within the next couple of days with your lab results. If you do not hear from me by tomorrow, give the clinic a call.

## 2011-08-24 NOTE — Progress Notes (Signed)
Addended by: Madolyn Frieze, Marylene Land J on: 08/24/2011 04:29 PM   Modules accepted: Orders

## 2011-08-24 NOTE — Assessment & Plan Note (Addendum)
Patient with persistent (although less) discharge after being treated for trich. Wet prep shows yeast. Will call patient and send in Rx for diflucan. (Patient did not answer home or cell number. Left message at cell number informing of results and Rx).   Also checking GC/Chlamydia and HIV/RPR today (per patient request).

## 2011-08-25 ENCOUNTER — Telehealth: Payer: Self-pay | Admitting: Family Medicine

## 2011-08-25 LAB — GC/CHLAMYDIA PROBE AMP, GENITAL: Chlamydia, DNA Probe: NEGATIVE

## 2011-08-25 LAB — HIV ANTIBODY (ROUTINE TESTING W REFLEX): HIV: NONREACTIVE

## 2011-08-25 NOTE — Telephone Encounter (Signed)
Please call patient back per your instructions with results of yesterday's labs.

## 2011-08-25 NOTE — Telephone Encounter (Signed)
Patient informed of negative results and that wet prep was positive for yeast, rx was sent to pharmacy.

## 2011-08-25 NOTE — Telephone Encounter (Signed)
Left message on voicemail for patient to call back for lab results.   

## 2011-08-25 NOTE — Telephone Encounter (Signed)
Call came in this morning from someone who had gotten this message that was not the patient.  The phone number that she had that was on file for the patient has been removed.  This patient did not get the message that was left.

## 2011-08-27 ENCOUNTER — Encounter: Payer: Self-pay | Admitting: Family Medicine

## 2011-09-05 ENCOUNTER — Ambulatory Visit (INDEPENDENT_AMBULATORY_CARE_PROVIDER_SITE_OTHER): Payer: 59 | Admitting: Family Medicine

## 2011-09-05 ENCOUNTER — Encounter: Payer: Self-pay | Admitting: Family Medicine

## 2011-09-05 VITALS — BP 125/84 | HR 76 | Temp 98.4°F | Ht 64.0 in | Wt 209.0 lb

## 2011-09-05 DIAGNOSIS — J069 Acute upper respiratory infection, unspecified: Secondary | ICD-10-CM | POA: Insufficient documentation

## 2011-09-05 NOTE — Progress Notes (Signed)
  Subjective:    Patient ID: Donna Carlson, female    DOB: 1975-12-01, 36 y.o.   MRN: [id number]  HPI  Upper Respiratory Infection Patient complains of symptoms of a URI. Symptoms include achiness, congestion, cough described as Due to postnasal drip, facial pain, fever temperature elevated to 99.5., nasal congestion and post nasal drip. Onset of symptoms was 2 days ago, and has been gradually worsening since that time. Treatment to date: none.   Review of Systems    Denies CP, SOB, HA, N/V/D, fever  Objective:   Physical Exam Vital signs reviewed General appearance - alert, well appearing, and in no distress and oriented to person, place, and time Heart - normal rate, regular rhythm, normal S1, S2, no murmurs, rubs, clicks or gallops Chest - clear to auscultation, no wheezes, rales or rhonchi, symmetric air entry, no tachypnea, retractions or cyanosis HEENT-TMs pearly-gray bilaterally-oropharynx clear not red.       Assessment & Plan:

## 2011-09-05 NOTE — Assessment & Plan Note (Signed)
Unlikely flu as no fevers or cough. Had nasal congestion. Advised symptomatic treatment with over-the-counter medicines. Out of work tomorrow. Note provided

## 2011-09-05 NOTE — Patient Instructions (Signed)

## 2011-11-02 ENCOUNTER — Other Ambulatory Visit: Payer: Self-pay | Admitting: Family Medicine

## 2011-11-02 ENCOUNTER — Ambulatory Visit (INDEPENDENT_AMBULATORY_CARE_PROVIDER_SITE_OTHER): Payer: 59 | Admitting: Family Medicine

## 2011-11-02 VITALS — BP 120/83 | HR 96 | Temp 98.5°F | Ht 64.0 in | Wt 214.0 lb

## 2011-11-02 DIAGNOSIS — J45901 Unspecified asthma with (acute) exacerbation: Secondary | ICD-10-CM

## 2011-11-02 MED ORDER — PREDNISONE 50 MG PO TABS: ORAL_TABLET | ORAL | Status: DC

## 2011-11-02 MED ORDER — ALBUTEROL SULFATE (5 MG/ML) 0.5% IN NEBU: 2.5000 mg | INHALATION_SOLUTION | Freq: Four times a day (QID) | RESPIRATORY_TRACT | Status: DC

## 2011-11-02 NOTE — Telephone Encounter (Signed)
Refill request

## 2011-11-02 NOTE — Patient Instructions (Signed)
It was nice to meet you, I am sorry your asthma flare is bothering you.  I am giving you a prednisone burst- you will take one pill daily for 5 days.  If you are not feeling better when you finish that, please call the office for a follow up appointment.

## 2011-11-02 NOTE — Progress Notes (Signed)
  Subjective:    Patient ID: Donna Carlson, female    DOB: [birth year], 36 y.o.   MRN: [id number]  HPI  [name] comes in due to asthma flare.  She has been having wheezing, cough for 2 days.  She is taking her advair as prescribed, twice a day but is not sure of the dosage.  She has had to use her albuterol nebulizer and inhaler several times in the past two days. It is worst at night time. She has not been coughing up much phlegm, but the cough is keeping her up.  She has not had fever or chills, nor nasal congestion.  She is not sure what brought the flair on.  She does not smoke.  She used her albuterol inhaler just prior to coming in to the office today.    Review of Systems Pertinent items in HPI.     Objective:   Physical Exam BP 120/83  Pulse 96  Temp(Src) 98.5 F (36.9 C) (Oral)  Ht 5\' 4"  (1.626 m)  Wt 214 lb (97.07 kg)  BMI 36.73 kg/m2  LMP 10/02/2011 General appearance: alert, cooperative and no distress Head: Normocephalic, without obvious abnormality, atraumatic Eyes: conjunctivae/corneas clear. PERRL, EOM's intact. Fundi benign. Ears: normal TM's and external ear canals both ears Nose: Nares normal. Septum midline. Mucosa normal. No drainage or sinus tenderness. Throat: lips, mucosa, and tongue normal; teeth and gums normal Lungs: Few scattered wheezes, good air movement.  Heart: regular rate and rhythm, S1, S2 normal, no murmur, click, rub or gallop       Assessment & Plan:

## 2011-11-02 NOTE — Assessment & Plan Note (Signed)
Will Rx prednisone burst, refill albuterol nebulizer.  Asked pt to bring advair next visit as it is not on our list and I am unsure about the dosage.  Follow up next week if no improvement.

## 2012-02-26 ENCOUNTER — Encounter: Payer: Self-pay | Admitting: Family Medicine

## 2012-02-26 ENCOUNTER — Ambulatory Visit (INDEPENDENT_AMBULATORY_CARE_PROVIDER_SITE_OTHER): Payer: 59 | Admitting: Family Medicine

## 2012-02-26 VITALS — BP 106/68 | HR 93 | Temp 99.5°F | Ht 64.0 in | Wt 218.0 lb

## 2012-02-26 DIAGNOSIS — J45901 Unspecified asthma with (acute) exacerbation: Secondary | ICD-10-CM

## 2012-02-26 MED ORDER — PREDNISONE 50 MG PO TABS: ORAL_TABLET | ORAL | Status: AC

## 2012-02-26 NOTE — Patient Instructions (Addendum)
WIll treat asthma attack with prednisone and continue the albuterol. I have sent this to the outpatient pharmacy. You may benefit from changing the air filter in the [facility] unit.  Asthma Attack Prevention HOW CAN ASTHMA BE PREVENTED? Currently, there is no way to prevent asthma from starting. However, you can take steps to control the disease and prevent its symptoms after you have been diagnosed. Learn about your asthma and how to control it. Take an active role to control your asthma by working with your caregiver to create and follow an asthma action plan. An asthma action plan guides you in taking your medicines properly, avoiding factors that make your asthma worse, tracking your level of asthma control, responding to worsening asthma, and seeking emergency care when needed. To track your asthma, keep records of your symptoms, check your peak flow number using a peak flow meter (handheld device that shows how well air moves out of your lungs), and get regular asthma checkups.  Other ways to prevent asthma attacks include:  Use medicines as your caregiver directs.   Identify and avoid things that make your asthma worse (as much as you can).   Keep track of your asthma symptoms and level of control.   Get regular checkups for your asthma.   With your caregiver, write a detailed plan for taking medicines and managing an asthma attack. Then be sure to follow your action plan. Asthma is an ongoing condition that needs regular monitoring and treatment.   Identify and avoid asthma triggers. A number of outdoor allergens and irritants (pollen, mold, cold air, air pollution) can trigger asthma attacks. Find out what causes or makes your asthma worse, and take steps to avoid those triggers (see below).   Monitor your breathing. Learn to recognize warning signs of an attack, such as slight coughing, wheezing or shortness of breath. However, your lung function may already decrease before you notice any  signs or symptoms, so regularly measure and record your peak airflow with a home peak flow meter.   Identify and treat attacks early. If you act quickly, you're less likely to have a severe attack. You will also need less medicine to control your symptoms. When your peak flow measurements decrease and alert you to an upcoming attack, take your medicine as instructed, and immediately stop any activity that may have triggered the attack. If your symptoms do not improve, get medical help.   Pay attention to increasing quick-relief inhaler use. If you find yourself relying on your quick-relief inhaler (such as albuterol), your asthma is not under control. See your caregiver about adjusting your treatment.  IDENTIFY AND CONTROL FACTORS THAT MAKE YOUR ASTHMA WORSE A number of common things can set off or make your asthma symptoms worse (asthma triggers). Keep track of your asthma symptoms for several weeks, detailing all the environmental and emotional factors that are linked with your asthma. When you have an asthma attack, go back to your asthma diary to see which factor, or combination of factors, might have contributed to it. Once you know what these factors are, you can take steps to control many of them.  Allergies: If you have allergies and asthma, it is important to take asthma prevention steps at home. Asthma attacks (worsening of asthma symptoms) can be triggered by allergies, which can cause temporary increased inflammation of your airways. Minimizing contact with the substance to which you are allergic will help prevent an asthma attack. Animal Dander:   Some people are allergic to  the flakes of skin or dried saliva from animals with fur or feathers. Keep these pets out of your home.   If you can't keep a pet outdoors, keep the pet out of your bedroom and other sleeping areas at all times, and keep the door closed.   Remove carpets and furniture covered with cloth from your home. If that is not  possible, keep the pet away from fabric-covered furniture and carpets.  Dust Mites:  Many people with asthma are allergic to dust mites. Dust mites are tiny bugs that are found in every home, in mattresses, pillows, carpets, fabric-covered furniture, bedcovers, clothes, stuffed toys, fabric, and other fabric-covered items.   Cover your mattress in a special dust-proof cover.   Cover your pillow in a special dust-proof cover, or wash the pillow each week in hot water. Water must be hotter than 130 F to kill dust mites. Cold or warm water used with detergent and bleach can also be effective.   Wash the sheets and blankets on your bed each week in hot water.   Try not to sleep or lie on cloth-covered cushions.   Call ahead when traveling and ask for a smoke-free hotel room. Bring your own bedding and pillows, in case the hotel only supplies feather pillows and down comforters, which may contain dust mites and cause asthma symptoms.   Remove carpets from your bedroom and those laid on concrete, if you can.   Keep stuffed toys out of the bed, or wash the toys weekly in hot water or cooler water with detergent and bleach.  Cockroaches:  Many people with asthma are allergic to the droppings and remains of cockroaches.   Keep food and garbage in closed containers. Never leave food out.   Use poison baits, traps, powders, gels, or paste (for example, boric acid).   If a spray is used to kill cockroaches, stay out of the room until the odor goes away.  Indoor Mold:  Fix leaky faucets, pipes, or other sources of water that have mold around them.   Clean moldy surfaces with a cleaner that has bleach in it.  Pollen and Outdoor Mold:  When pollen or mold spore counts are high, try to keep your windows closed.   Stay indoors with windows closed from late morning to afternoon, if you can. Pollen and some mold spore counts are highest at that time.   Ask your caregiver whether you need to take  or increase anti-inflammatory medicine before your allergy season starts.  Irritants:   Tobacco smoke is an irritant. If you smoke, ask your caregiver how you can quit. Ask family members to quit smoking, too. Do not allow smoking in your home or car.   If possible, do not use a wood-burning stove, kerosene heater, or fireplace. Minimize exposure to all sources of smoke, including incense, candles, fires, and fireworks.   Try to stay away from strong odors and sprays, such as perfume, talcum powder, hair spray, and paints.   Decrease humidity in your home and use an indoor air cleaning device. Reduce indoor humidity to below 60 percent. Dehumidifiers or central air conditioners can do this.   Try to have someone else vacuum for you once or twice a week, if you can. Stay out of rooms while they are being vacuumed and for a short while afterward.   If you vacuum, use a dust mask from a hardware store, a double-layered or microfilter vacuum cleaner bag, or a vacuum cleaner with  a HEPA filter.   Sulfites in foods and beverages can be irritants. Do not drink beer or wine, or eat dried fruit, processed potatoes, or shrimp if they cause asthma symptoms.   Cold air can trigger an asthma attack. Cover your nose and mouth with a scarf on cold or windy days.   Several health conditions can make asthma more difficult to manage, including runny nose, sinus infections, reflux disease, psychological stress, and sleep apnea. Your caregiver will treat these conditions, as well.   Avoid close contact with people who have a cold or the flu, since your asthma symptoms may get worse if you catch the infection from them. Wash your hands thoroughly after touching items that may have been handled by people with a respiratory infection.   Get a flu shot every year to protect against the flu virus, which often makes asthma worse for days or weeks. Also get a pneumonia shot once every five to 10 years.   Drugs:  Aspirin and other painkillers can cause asthma attacks. 10% to 20% of people with asthma have sensitivity to aspirin or a group of painkillers called non-steroidal anti-inflammatory drugs (NSAIDS), such as ibuprofen and naproxen. These drugs are used to treat pain and reduce fevers. Asthma attacks caused by any of these medicines can be severe and even fatal. These drugs must be avoided in people who have known aspirin sensitive asthma. Products with acetaminophen are considered safe for people who have asthma. It is important that people with aspirin sensitivity read labels of all over-the-counter drugs used to treat pain, colds, coughs, and fever.   Beta blockers and ACE inhibitors are other drugs which you should discuss with your caregiver, in relation to your asthma.  ALLERGY SKIN TESTING  Ask your asthma caregiver about allergy skin testing or blood testing (RAST test) to identify the allergens to which you are sensitive. If you are found to have allergies, allergy shots (immunotherapy) for asthma may help prevent future allergies and asthma. With allergy shots, small doses of allergens (substances to which you are allergic) are injected under your skin on a regular schedule. Over a period of time, your body may become used to the allergen and less responsive with asthma symptoms. You can also take measures to minimize your exposure to those allergens. EXERCISE  If you have exercise-induced asthma, or are planning vigorous exercise, or exercise in cold, humid, or dry environments, prevent exercise-induced asthma by following your caregiver's advice regarding asthma treatment before exercising. Document Released: 07/26/2009 Document Revised: 07/27/2011 Document Reviewed: 07/26/2009 [facility] Patient Information 2012 [place], [place].

## 2012-02-26 NOTE — Progress Notes (Signed)
  Subjective:     Donna Carlson is a 36 y.o. female who presents for evaluation of asthma. Was previously diagnosed with asthma as a young child. The patient is currently having symptoms / an exacerbation. This is similar to previous asthma exacerbations.  The patient last had an episodes several months ago. This episode appears to have been triggered by cold air and AC unit at home-air seems moist. Current symptoms include chest tightness, dyspnea and wheezing. Symptoms have been present since 3 days ago and have been stable. She denies chest pain, productive cough and palpitations.  Treatments tried for the current exacerbation include short-acting inhaled beta-adrenergic agonists, which have provided some relief of symptoms. Last used albuterol one hour ago and improved the symptoms.   Denies fevers, chest pain, leg swelling, rhinorrhea, cough, rash, abdominal pain.  Nonsmoker.   The following portions of the patient's history were reviewed and updated as appropriate: allergies, current medications, past family history, past medical history, past social history, past surgical history and problem list.  Review of Systems Pertinent items are noted in HPI.    Objective:    Oxygen saturation 97% on room air BP 106/68  Pulse 93  Temp 99.5 F (37.5 C) (Oral)  Ht 5\' 4"  (1.626 m)  Wt 218 lb (98.884 kg)  BMI 37.42 kg/m2  SpO2 97%  LMP 10/27/2011 General appearance: alert, cooperative, appears stated age and no distress Head: Normocephalic, without obvious abnormality, atraumatic Eyes: conjunctivae/corneas clear. PERRL, EOM's intact. Fundi benign. Nose: Nares normal. Septum midline. Mucosa normal. No drainage or sinus tenderness. Throat: lips, mucosa, and tongue normal; teeth and gums normal Neck: mild anterior cervical adenopathy, supple, symmetrical, trachea midline and thyroid not enlarged, symmetric, no tenderness/mass/nodules Lungs: clear to auscultation bilaterally and mild  diminished air movement throughout. Heart: regular rate and rhythm, S1, S2 normal, no murmur, click, rub or gallop Abdomen: soft, non-tender; bowel sounds normal; no masses,  no organomegaly Extremities: extremities normal, atraumatic, no cyanosis or edema Neurologic: Grossly normal    Assessment:    Moderate persistent asthma. Apparent precipitants include cold air and likely old AC air filter. No treatment was given in the office-last had albuterol one hour ago at home.     Plan:    Medications: continue advair and albuterol q4 hr prn. Prednisone 50 mg x 5 days. No sign of bacterial precipitant to warrant antibiotics currently. Reduce exposure to inhaled allergens: don't use humidifiers (may increased dust & mold) and change air filter in AC units. Discussed avoidance of precipitants. Discussed medication dosage, use, side effects, and goals of treatment in detail.   Warning signs of respiratory distress were reviewed with the patient.  Follow up in 2 days, or sooner should new symptoms or problems arise. Marland Kitchen

## 2012-04-01 ENCOUNTER — Other Ambulatory Visit: Payer: Self-pay | Admitting: *Deleted

## 2012-04-01 MED ORDER — ALBUTEROL SULFATE HFA 108 (90 BASE) MCG/ACT IN AERS: 2.0000 | INHALATION_SPRAY | Freq: Four times a day (QID) | RESPIRATORY_TRACT | Status: DC | PRN

## 2012-06-11 ENCOUNTER — Ambulatory Visit (INDEPENDENT_AMBULATORY_CARE_PROVIDER_SITE_OTHER): Payer: 59 | Admitting: Family Medicine

## 2012-06-11 ENCOUNTER — Encounter: Payer: Self-pay | Admitting: Family Medicine

## 2012-06-11 VITALS — BP 118/78 | HR 88 | Temp 98.6°F | Ht 64.0 in | Wt 218.3 lb

## 2012-06-11 DIAGNOSIS — J45901 Unspecified asthma with (acute) exacerbation: Secondary | ICD-10-CM

## 2012-06-11 MED ORDER — PREDNISONE 50 MG PO TABS: ORAL_TABLET | ORAL | Status: DC

## 2012-06-11 MED ORDER — ALBUTEROL SULFATE (2.5 MG/3ML) 0.083% IN NEBU
2.5000 mg | INHALATION_SOLUTION | Freq: Once | RESPIRATORY_TRACT | Status: AC
  Administered 2012-06-11: 2.5 mg via RESPIRATORY_TRACT

## 2012-06-11 MED ORDER — IPRATROPIUM BROMIDE 0.02 % IN SOLN
0.5000 mg | Freq: Once | RESPIRATORY_TRACT | Status: AC
  Administered 2012-06-11: 0.5 mg via RESPIRATORY_TRACT

## 2012-06-11 MED ORDER — AZITHROMYCIN 500 MG PO TABS: 500.0000 mg | ORAL_TABLET | Freq: Every day | ORAL | Status: DC

## 2012-06-11 NOTE — Addendum Note (Signed)
Addended by: Deno Etienne on: 06/11/2012 10:27 AM   Modules accepted: Orders

## 2012-06-11 NOTE — Assessment & Plan Note (Signed)
Pulm exam moderately improved after Albuterol/Atrovent treatment here. Still some minor wheezing at bases. Treat with prednisone burst and macrolide.   FU if no improvement later this week.

## 2012-06-11 NOTE — Patient Instructions (Addendum)
Take the Prednisone 1 pill a day for the next 5 days. Keep using your inhaler on a scheduled basis every 6 hour for the next 2-3 days, then move to as needed.   Take the Azithromycin for the next 3 days.    Come back and see [place] if you're still having wheezing by the end of the week or sooner if you're getting worse.  It was good to meet you today

## 2012-06-11 NOTE — Progress Notes (Signed)
  Subjective:    Patient ID: Donna Carlson, female    DOB: 06-07-1976, 36 y.o.   MRN: [id number]  HPI  1.  Asthma exacerbation:  Started on Sunday with tightness in her chest and increasing dyspnea.  Partially relieved with albuterol inhaler.  Since then, increasing sore throat, rhinorrhea, sinus congestion.  Cough alternating between dry hacking cough and productive of green sputum.  Subjective fevers.  No chills.  Using nebulizer and inhaler multiple times a day without much relief.   2.  Toe injury:  Stubbed 4th toe of left foot while walking at night 3 days ago.  Has some pain there.  No swelling.     Review of Systems See HPI above for review of systems.       Objective:   Physical Exam BP 118/78  Pulse 88  Temp 98.6 F (37 C) (Oral)  Ht 5\' 4"  (1.626 m)  Wt 218 lb 4.8 oz (99.02 kg)  BMI 37.47 kg/m2  SpO2 100% Gen:  Patient sitting on exam table, appears stated age in no acute distress, speaking in full sentences.  Head: Normocephalic atraumatic Eyes: EOMI, PERRL, sclera and conjunctiva non-erythematous Nose:  Nasal turbinates grossly enlarged bilaterally. Some exudates noted.  Mouth: Mucosa membranes moist. Tonsils +2, nonenlarged, non-erythematous. Neck: No cervical lymphadenopathy noted Heart:  RRR, no murmurs auscultated. Pulm:  DIffuse wheezing in all lung fields.  Worse at bases BL.  No increased respiratory effort.   Feet:  4th digit of Left toe TTP along PIP joint.  No metacarpal pain.  No pain over 5th metacarpal.  No tenderness or pain elsewhere.            Assessment & Plan:

## 2012-06-20 ENCOUNTER — Ambulatory Visit (INDEPENDENT_AMBULATORY_CARE_PROVIDER_SITE_OTHER): Payer: 59 | Admitting: Family Medicine

## 2012-06-20 ENCOUNTER — Encounter: Payer: Self-pay | Admitting: Family Medicine

## 2012-06-20 VITALS — BP 118/80 | HR 90 | Temp 98.6°F | Ht 64.0 in | Wt 221.0 lb

## 2012-06-20 DIAGNOSIS — S92919A Unspecified fracture of unspecified toe(s), initial encounter for closed fracture: Secondary | ICD-10-CM

## 2012-06-20 DIAGNOSIS — S92501A Displaced unspecified fracture of right lesser toe(s), initial encounter for closed fracture: Secondary | ICD-10-CM | POA: Insufficient documentation

## 2012-06-20 NOTE — Patient Instructions (Signed)
I am sorry your toe is broken- please try the post-op shoe so that it doesn't move around and hurt.    Please come back in 2-4 weeks, or sooner if the pain gets worse.  You can take tylenol or ibuprofen as needed for pain.

## 2012-06-20 NOTE — Assessment & Plan Note (Signed)
Unclear type of fracture as patient has no record of the x-ray.  However, based on exam, I feel it is not displaced.  She is still having significant pain with buddy taping, so I placed her in a post-op shoe today, she will need to wear it for 2-4 weeks until the toe is feeling better.  Follow up if not improving.

## 2012-06-20 NOTE — Progress Notes (Signed)
  Subjective:    Patient ID: Donna Carlson, female    DOB: 1975/11/21, 36 y.o.   MRN: [id number]  HPI  [name] comes in for a broken 4th right toe.  She says she tripped about two weeks ago and it hurt very badly.  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  She has been buddy taping the toe but it is still hurting her pretty badly.   Review of Systems See HPI    Objective:   Physical Exam BP 118/80  Pulse 90  Temp 98.6 F (37 C) (Oral)  Ht 5\' 4"  (1.626 m)  Wt 221 lb (100.245 kg)  BMI 37.93 kg/m2 General appearance: alert, cooperative and no distress Toes: 4th right toe is swollen compared to left, tender to palpation.  No step-offs, crepitus, or deformity.         Assessment & Plan:

## 2012-08-02 ENCOUNTER — Ambulatory Visit (HOSPITAL_COMMUNITY): Payer: 59 | Attending: Cardiology | Admitting: Radiology

## 2012-08-02 ENCOUNTER — Ambulatory Visit (INDEPENDENT_AMBULATORY_CARE_PROVIDER_SITE_OTHER): Payer: 59 | Admitting: Cardiovascular Disease

## 2012-08-02 ENCOUNTER — Encounter (INDEPENDENT_AMBULATORY_CARE_PROVIDER_SITE_OTHER): Payer: 59

## 2012-08-02 ENCOUNTER — Encounter: Payer: Self-pay | Admitting: Cardiovascular Disease

## 2012-08-02 VITALS — BP 128/74 | HR 73 | Ht 64.0 in | Wt 219.8 lb

## 2012-08-02 DIAGNOSIS — R002 Palpitations: Secondary | ICD-10-CM

## 2012-08-02 DIAGNOSIS — I059 Rheumatic mitral valve disease, unspecified: Secondary | ICD-10-CM | POA: Insufficient documentation

## 2012-08-02 NOTE — Progress Notes (Signed)
HPI:  Donna Carlson comes in for initial cardiac evaluation. 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 She presents with cardiac palpitations for the past 3 months. These occur about twice per week. Oftentimes they are brought on by activity. She complains of heart racing for a few minutes and she is able to break the feeling of her heart pounding by bearing down. She denies associated lightheadedness, dyspnea, or chest pain/pressure. She has had a few chest pains in the past, but none in the past 2 months.   She has no history of cardiac disease, HTN, or diabetes. She has asthma but has not used rescue inhalers any more frequently than in the past and does not associate this with her palpitations. She doesn't drink caffeine and she drinks alcohol only on occasion.  Outpatient Encounter Prescriptions as of 08/02/2012  Medication Sig Dispense Refill  . ADVAIR DISKUS 500-50 MCG/DOSE AEPB INHALE 1 PUFF INTO THE LUNGS 2 TIMES DAILY.  60 each  6  . albuterol (PROVENTIL) (2.5 MG/3ML) 0.083% nebulizer solution INHALE 1 VIAL VIA NEBULIZER FOUR TIMES DAILY AS DIRECTED  75 vial  6  . albuterol (PROVENTIL) (5 MG/ML) 0.5% nebulizer solution Take 0.5 mLs (2.5 mg total) by nebulization 4 (four) times daily.  20 mL  5  . albuterol (VENTOLIN HFA) 108 (90 BASE) MCG/ACT inhaler Inhale 2 puffs into the lungs every 6 (six) hours as needed for wheezing.  2 Inhaler  PRN  . azithromycin (ZITHROMAX) 500 MG tablet Take 1 tablet (500 mg total) by mouth daily.  3 tablet  0  . Fluticasone-Salmeterol (ADVAIR) 250-50 MCG/DOSE AEPB Inhale 1 puff into the lungs every 12 (twelve) hours.      . predniSONE (DELTASONE) 50 MG tablet Take 1 tab po daily x 5 days  5 tablet  0  . valACYclovir (VALTREX) 500 MG tablet Take 1 tablet (500 mg total) by mouth 2 (two) times daily.  14 tablet  3    Review of patient's allergies indicates no known allergies.  Past Medical History  Diagnosis Date  . Palpitations     Past Surgical  History  Procedure Date  . Gallbladder surgery   . Tubal ligation     History   Social History  . Marital Status: Single    Spouse Name: N/A    Number of Children: N/A  . Years of Education: N/A   Occupational History  . Not on file.   Social History Main Topics  . Smoking status: Never Smoker   . Smokeless tobacco: Not on file  . Alcohol Use: Yes  . Drug Use: Not on file  . Sexually Active: Not on file   Other Topics Concern  . Not on file   Social History Narrative  . No narrative on file    Family History  Problem Relation Age of Onset  . Hypertension Mother   . Hypertension Maternal Grandmother   . Heart attack Paternal Grandfather     ROS:  General: no fevers/chills/night sweats Eyes: no blurry vision, diplopia, or amaurosis ENT: no sore throat or hearing loss Resp: positive for cough and wheezing associated with her asthma. CV: no edema  GI: no abdominal pain, nausea, vomiting, diarrhea, or constipation GU: no dysuria, frequency, or hematuria Skin: no rash Neuro: no headache, numbness, tingling, or weakness of extremities Musculoskeletal: no joint pain or swelling Heme: no bleeding, DVT, or easy bruising Endo: no polydipsia or polyuria  BP 128/74  Pulse 73  Ht  5\' 4"  (1.626 m)  Wt 99.701 kg (219 lb 12.8 oz)  BMI 37.73 kg/m2  PHYSICAL EXAM: Pt is alert and oriented, WD, WN, in no distress. HEENT: normal Neck: JVP normal. Carotid upstrokes normal without bruits. No thyromegaly. Lungs: equal expansion, clear bilaterally CV: Apex is discrete and nondisplaced, RRR without murmur or gallop Abd: soft, NT, +BS, no bruit, no hepatosplenomegaly Back: no CVA tenderness Ext: no C/C/E        Femoral pulses 2+= without bruits        DP/PT pulses intact and = Skin: warm and dry without rash Neuro: CNII-XII intact             Strength intact = bilaterally  EKG:  NSR, within normal limits, 78 bpm  ASSESSMENT AND PLAN: Cardiac Palpitations: symptoms  concerning for SVT as she has to Valsalva to break them. I have recommended an event recorder since they occur at a frequency of twice/week on average. Also have recommended a 2D Echo to evaluate for structural heart disease. Will see her back as needed unless studies show any abnormality. She will call if symptoms worsen and knows to seek immediate medical attention if sustained tachypalpitations or chest pain.  Tonny Bollman 08/05/2012 11:14 PM

## 2012-08-02 NOTE — Patient Instructions (Addendum)
Your physician has recommended that you wear an event monitor. Event monitors are medical devices that record the heart's electrical activity. Doctors most often [place] these monitors to diagnose arrhythmias. Arrhythmias are problems with the speed or rhythm of the heartbeat. The monitor is a small, portable device. You can wear one while you do your normal daily activities. This is usually used to diagnose what is causing palpitations/syncope (passing out).  Your physician has requested that you have an echocardiogram. Echocardiography is a painless test that uses sound waves to create images of your heart. It provides your doctor with information about the size and shape of your heart and how well your heart's chambers and valves are working. This procedure takes approximately one hour. There are no restrictions for this procedure.  Your physician recommends that you schedule a follow-up appointment as needed.   Your physician recommends that you continue on your current medications as directed. Please refer to the Current Medication list given to you today.

## 2012-08-02 NOTE — Progress Notes (Signed)
Patient ID: Donna Carlson, female   DOB: 1976/08/12, 36 y.o.   MRN: [id number] Placed a event monitor on patient and also went over instructions on how to use monitor and when to return  it

## 2012-08-02 NOTE — Progress Notes (Signed)
Echocardiogram performed.  

## 2012-09-26 ENCOUNTER — Other Ambulatory Visit: Payer: Self-pay | Admitting: Family Medicine

## 2012-10-08 ENCOUNTER — Telehealth: Payer: Self-pay | Admitting: Family Medicine

## 2012-10-08 ENCOUNTER — Encounter: Payer: Self-pay | Admitting: Family Medicine

## 2012-10-08 ENCOUNTER — Ambulatory Visit (INDEPENDENT_AMBULATORY_CARE_PROVIDER_SITE_OTHER): Payer: 59 | Admitting: Family Medicine

## 2012-10-08 VITALS — BP 127/85 | HR 77 | Temp 98.1°F | Ht 64.0 in | Wt 209.3 lb

## 2012-10-08 DIAGNOSIS — J069 Acute upper respiratory infection, unspecified: Secondary | ICD-10-CM

## 2012-10-08 DIAGNOSIS — J45901 Unspecified asthma with (acute) exacerbation: Secondary | ICD-10-CM

## 2012-10-08 MED ORDER — PREDNISONE 50 MG PO TABS: ORAL_TABLET | ORAL | Status: DC

## 2012-10-08 NOTE — Progress Notes (Signed)
Patient ID: Donna Carlson, female   DOB: 05-Jan-1976, 37 y.o.   MRN: [id number] SUBJECTIVE:  Donna Carlson is a 37 y.o. female who complains of congestion, sore throat, dry cough and none, mild-moderate wheezing during cough for 2 days. She denies a history of chills, fevers, myalgias, nausea, vomiting and sputum production and has a history of asthma. Patient does not smoke cigarettes. She did receive a flu vaccine this year   OBJECTIVE: She appears well, vital signs are as noted. Ears normal.  Throat and pharynx normal.  Neck supple. No adenopathy in the neck. Nose is congested. Sinuses non tender. The chest is clear, with wheezing in lower lung fields.   ASSESSMENT:  viral upper respiratory illness with mild asthma exacerbation.  PLAN: Symptomatic therapy suggested: push fluids, rest, return office visit prn if symptoms persist or worsen and continue use of bronchodilators.  Will add on burst of prednisone.. Lack of antibiotic effectiveness discussed with her. Call or return to clinic prn if these symptoms worsen or fail to improve as anticipated.

## 2012-10-08 NOTE — Assessment & Plan Note (Signed)
viral upper respiratory illness with mild asthma exacerbation.  PLAN: Symptomatic therapy suggested: push fluids, rest, return office visit prn if symptoms persist or worsen and continue use of bronchodilators.  Will add on burst of prednisone.. Lack of antibiotic effectiveness discussed with her. Call or return to clinic prn if these symptoms worsen or fail to improve as anticipated.

## 2012-10-08 NOTE — Telephone Encounter (Signed)
Sore throat/asthma/sinus congestion - wants to be seen today

## 2012-10-08 NOTE — Patient Instructions (Addendum)
Thank you for coming in today, it was good to see you Continue using your abuterol and advair You can try over the counter mucinex  Be sure to drink plenty of water and rest Return for worsening symptoms.

## 2012-10-08 NOTE — Telephone Encounter (Signed)
Advised patient to come to office now. She can be here in 15 minutes. Explained that we will work her in this AM. She voices understanding.

## 2012-11-26 ENCOUNTER — Other Ambulatory Visit: Payer: Self-pay | Admitting: Occupational Medicine

## 2012-11-26 ENCOUNTER — Ambulatory Visit: Payer: Self-pay

## 2012-11-26 DIAGNOSIS — R7612 Nonspecific reaction to cell mediated immunity measurement of gamma interferon antigen response without active tuberculosis: Secondary | ICD-10-CM

## 2012-12-02 ENCOUNTER — Encounter (HOSPITAL_COMMUNITY): Payer: Self-pay

## 2012-12-02 ENCOUNTER — Emergency Department (HOSPITAL_COMMUNITY)
Admission: EM | Admit: 2012-12-02 | Discharge: 2012-12-02 | Disposition: A | Discharge status: Home / Self Care | Payer: 59 | Attending: Emergency Medicine | Admitting: Emergency Medicine

## 2012-12-02 DIAGNOSIS — M545 Low back pain, unspecified: Secondary | ICD-10-CM

## 2012-12-02 DIAGNOSIS — Z79899 Other long term (current) drug therapy: Secondary | ICD-10-CM | POA: Insufficient documentation

## 2012-12-02 DIAGNOSIS — Z8679 Personal history of other diseases of the circulatory system: Secondary | ICD-10-CM | POA: Insufficient documentation

## 2012-12-02 DIAGNOSIS — J45909 Unspecified asthma, uncomplicated: Secondary | ICD-10-CM | POA: Insufficient documentation

## 2012-12-02 HISTORY — DX: Unspecified asthma, uncomplicated: J45.909

## 2012-12-02 MED ORDER — METHOCARBAMOL 500 MG PO TABS
500.0000 mg | ORAL_TABLET | Freq: Once | ORAL | Status: AC
  Administered 2012-12-02: 500 mg via ORAL
  Filled 2012-12-02: qty 1

## 2012-12-02 MED ORDER — CYCLOBENZAPRINE HCL 10 MG PO TABS: 10.0000 mg | ORAL_TABLET | Freq: Two times a day (BID) | ORAL | Status: DC | PRN

## 2012-12-02 MED ORDER — ONDANSETRON HCL 4 MG/2ML IJ SOLN
4.0000 mg | Freq: Once | INTRAMUSCULAR | Status: AC
  Administered 2012-12-02: 4 mg via INTRAVENOUS
  Filled 2012-12-02: qty 2

## 2012-12-02 MED ORDER — HYDROMORPHONE HCL PF 1 MG/ML IJ SOLN
1.0000 mg | Freq: Once | INTRAMUSCULAR | Status: AC
  Administered 2012-12-02: 1 mg via INTRAVENOUS
  Filled 2012-12-02: qty 1

## 2012-12-02 MED ORDER — OXYCODONE-ACETAMINOPHEN 5-325 MG PO TABS
2.0000 | ORAL_TABLET | Freq: Once | ORAL | Status: AC
  Administered 2012-12-02: 2 via ORAL
  Filled 2012-12-02: qty 2

## 2012-12-02 MED ORDER — OXYCODONE-ACETAMINOPHEN 5-325 MG PO TABS: 1.0000 | ORAL_TABLET | Freq: Four times a day (QID) | ORAL | Status: DC | PRN

## 2012-12-02 MED ORDER — HYDROMORPHONE HCL PF 1 MG/ML IJ SOLN
1.0000 mg | Freq: Once | INTRAMUSCULAR | Status: AC
  Administered 2012-12-02: 1 mg via INTRAMUSCULAR
  Filled 2012-12-02: qty 1

## 2012-12-02 NOTE — ED Notes (Signed)
The patient is AOx4 and comfortable with her discharge instructions.  Her companion is driving her home.

## 2012-12-02 NOTE — ED Provider Notes (Signed)
History     CSN: [id number]  Arrival date & time 12/02/12  1222   First MD Initiated Contact with Patient 12/02/12 1712      Chief Complaint  Patient presents with  . Leg Pain    (Consider location/radiation/quality/duration/timing/severity/associated sxs/prior treatment) HPI Comments: Patient presents today with a chief complaint of lower back pain.  Pain radiates down the left leg.  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  She denies any acute trauma.  Patient is a 37 y.o. female presenting with back pain. The history is provided by the patient.  Back Pain Location:  Lumbar spine Quality:  Aching Radiates to:  R posterior upper leg Onset quality:  Gradual Duration:  3 days Timing:  Constant Progression:  Worsening Relieved by:  Nothing Worsened by:  Bending and twisting Ineffective treatments:  NSAIDs Associated symptoms: leg pain   Associated symptoms: no abdominal pain, no bladder incontinence, no bowel incontinence, no dysuria, no fever, no numbness, no tingling and no weakness   Risk factors: no hx of cancer     Past Medical History  Diagnosis Date  . Palpitations   . Asthma     Past Surgical History  Procedure Laterality Date  . Gallbladder surgery    . Tubal ligation      Family History  Problem Relation Age of Onset  . Hypertension Mother   . Hypertension Maternal Grandmother   . Heart attack Paternal Grandfather     History  Substance Use Topics  . Smoking status: Never Smoker   . Smokeless tobacco: Not on file  . Alcohol Use: Yes    OB History   Grav Para Term Preterm Abortions TAB SAB Ect Mult Living                  Review of Systems  Constitutional: Negative for fever.  Gastrointestinal: Negative for abdominal pain and bowel incontinence.  Genitourinary: Negative for bladder incontinence and dysuria.  Musculoskeletal: Positive for back pain.  Neurological: Negative for tingling,  weakness and numbness.  All other systems reviewed and are negative.    Allergies  Iodine  Home Medications   Current Outpatient Rx  Name  Route  Sig  Dispense  Refill  . ADVAIR DISKUS 500-50 MCG/DOSE AEPB      INHALE 1 PUFF INTO THE LUNGS 2 TIMES DAILY.   60 each   6   . albuterol (PROVENTIL) (2.5 MG/3ML) 0.083% nebulizer solution      INHALE 1 VIAL VIA NEBULIZER FOUR TIMES DAILY AS DIRECTED   75 vial   6   . albuterol (PROVENTIL) (5 MG/ML) 0.5% nebulizer solution   Nebulization   Take 0.5 mLs (2.5 mg total) by nebulization 4 (four) times daily.   20 mL   5   . albuterol (VENTOLIN HFA) 108 (90 BASE) MCG/ACT inhaler   Inhalation   Inhale 2 puffs into the lungs every 6 (six) hours as needed for wheezing.   2 Inhaler   PRN   . Fluticasone-Salmeterol (ADVAIR) 250-50 MCG/DOSE AEPB   Inhalation   Inhale 1 puff into the lungs every 12 (twelve) hours.         . predniSONE (DELTASONE) 50 MG tablet      Take 1 tab po daily x 5 days   5 tablet   0   . valACYclovir (VALTREX) 500 MG tablet      TAKE 1 TABLET BY MOUTH TWICE  DAILY   14 tablet   3     BP 124/80  Pulse 75  Temp(Src) 97.8 F (36.6 C) (Oral)  Resp 16  Ht 5\' 4"  (1.626 m)  Wt 209 lb (94.802 kg)  BMI 35.86 kg/m2  SpO2 99%  LMP 11/17/2012  Physical Exam  Nursing note and vitals reviewed. Constitutional: She is oriented to person, place, and time. She appears well-developed and well-nourished. No distress.  HENT:  Head: Normocephalic and atraumatic.  Neck: Normal range of motion and full passive range of motion without pain. Neck supple. No spinous process tenderness and no muscular tenderness present. No rigidity. Normal range of motion present.  Cardiovascular: Normal rate, regular rhythm, normal heart sounds and intact distal pulses.  Exam reveals no gallop and no friction rub.   No murmur heard. Pulmonary/Chest: Effort normal and breath sounds normal. No respiratory distress. She has no  wheezes. She has no rales. She exhibits no tenderness.  Musculoskeletal:       Cervical back: She exhibits normal range of motion, no tenderness, no bony tenderness and no pain.       Thoracic back: She exhibits no tenderness, no bony tenderness and no pain.       Lumbar back: She exhibits tenderness, bony tenderness and pain. She exhibits no spasm and normal pulse.       Right foot: She exhibits no swelling.       Left foot: She exhibits no swelling.  Bilateral lower extremities nontender without color change, baseline range of motion of extremities with intact distal pulses, capillary refill less than 2 seconds bilaterally.  Pt has increased pain w ROM of lumbar spine. Pain w ambulation, no sign of ataxia. Positive straight leg test  Neurological: She is alert and oriented to person, place, and time. She has normal strength and normal reflexes. No sensory deficit. Gait (no ataxia, slowed and hunched d/t pain ) abnormal.  Sensation at baseline for light touch in all 4 distal extremities, motor symmetric & bilateral 5/5 (hips: abduction, adduction, flexion; knee: flexion & extension; foot: dorsiflexion, plantar flexion, toes: dorsi flexion) Patellar & ankle reflexes intact.   Skin: Skin is warm and dry. She is not diaphoretic. No erythema.  Psychiatric: She has a normal mood and affect.    ED Course  Procedures (including critical care time)  Labs Reviewed - No data to display No results found.   No diagnosis found.    MDM  Patient with back pain.  No neurological deficits and normal neuro exam.  Patient can walk but states is painful.  No loss of bowel or bladder control.  No concern for cauda equina.  No fever, night sweats, weight loss, h/o cancer, IVDU.  RICE protocol and pain medicine indicated and discussed with patient.  Patient instructed to follow up with her PCP to have a possible MRI scheduled.  Return precautions discussed.        Pascal Lux Riggins, PA-C 12/03/12  [phone]

## 2012-12-02 NOTE — ED Notes (Signed)
Rt. Leg pain, begins in the hip and radiates completely down into her foot.   Denies any injury.  No swelling noted.

## 2012-12-03 ENCOUNTER — Telehealth: Payer: Self-pay | Admitting: *Deleted

## 2012-12-03 NOTE — Telephone Encounter (Signed)
Patient calling because she "mixed up her meds."  Took #2 Flexeril 10 mg tabs instead of Percocet.  Has not taken Percocet yet.  Informed patient that she may be drowsy with extra dose of Flexeril and to hold Percocet unt.  Patient is aware that she should not drive or operate any machinery.  States she has someone home with her.  Patient advised to call back or call 911 if she becomes oversedated or has decreased breathing.  May resume Percocet when she is due for her next dose in 6 hours.  Patient was seen in ED yesterday for back pain and sciatica and scheduled follow-up appt with Dr. Lula Olszewski for 12/05/12 at 11:00 am.  Gaylene Brooks, RN

## 2012-12-03 NOTE — ED Provider Notes (Signed)
Medical screening examination/treatment/procedure(s) were performed by non-physician practitioner and as supervising physician I was immediately available for consultation/collaboration.  Doug Sou, MD 12/03/12 2055

## 2012-12-04 ENCOUNTER — Other Ambulatory Visit: Payer: Self-pay | Admitting: Occupational Medicine

## 2012-12-04 DIAGNOSIS — R52 Pain, unspecified: Secondary | ICD-10-CM

## 2012-12-05 ENCOUNTER — Ambulatory Visit: Payer: Self-pay | Admitting: Family Medicine

## 2012-12-05 ENCOUNTER — Ambulatory Visit (HOSPITAL_COMMUNITY)
Admission: RE | Admit: 2012-12-05 | Discharge: 2012-12-05 | Disposition: A | Discharge status: Home / Self Care | Payer: PRIVATE HEALTH INSURANCE | Source: Ambulatory Visit | Attending: Occupational Medicine | Admitting: Occupational Medicine

## 2012-12-05 DIAGNOSIS — M545 Low back pain, unspecified: Secondary | ICD-10-CM | POA: Insufficient documentation

## 2012-12-05 DIAGNOSIS — R52 Pain, unspecified: Secondary | ICD-10-CM

## 2012-12-05 DIAGNOSIS — M79609 Pain in unspecified limb: Secondary | ICD-10-CM | POA: Insufficient documentation

## 2013-01-15 ENCOUNTER — Ambulatory Visit: Payer: PRIVATE HEALTH INSURANCE | Attending: Physical Medicine and Rehabilitation | Admitting: Physical Therapy

## 2013-01-15 DIAGNOSIS — IMO0001 Reserved for inherently not codable concepts without codable children: Secondary | ICD-10-CM | POA: Insufficient documentation

## 2013-01-15 DIAGNOSIS — M545 Low back pain, unspecified: Secondary | ICD-10-CM | POA: Insufficient documentation

## 2013-01-21 ENCOUNTER — Ambulatory Visit: Payer: PRIVATE HEALTH INSURANCE | Attending: Family Medicine | Admitting: Physical Therapy

## 2013-01-21 DIAGNOSIS — M545 Low back pain, unspecified: Secondary | ICD-10-CM | POA: Insufficient documentation

## 2013-01-21 DIAGNOSIS — IMO0001 Reserved for inherently not codable concepts without codable children: Secondary | ICD-10-CM | POA: Insufficient documentation

## 2013-01-27 ENCOUNTER — Ambulatory Visit: Payer: PRIVATE HEALTH INSURANCE | Attending: Family Medicine | Admitting: Physical Therapy

## 2013-01-27 DIAGNOSIS — M545 Low back pain, unspecified: Secondary | ICD-10-CM | POA: Insufficient documentation

## 2013-01-27 DIAGNOSIS — IMO0001 Reserved for inherently not codable concepts without codable children: Secondary | ICD-10-CM | POA: Insufficient documentation

## 2013-01-29 ENCOUNTER — Ambulatory Visit: Payer: PRIVATE HEALTH INSURANCE | Attending: Family Medicine | Admitting: Physical Therapy

## 2013-01-29 DIAGNOSIS — IMO0001 Reserved for inherently not codable concepts without codable children: Secondary | ICD-10-CM | POA: Insufficient documentation

## 2013-01-29 DIAGNOSIS — M545 Low back pain, unspecified: Secondary | ICD-10-CM | POA: Insufficient documentation

## 2013-02-04 ENCOUNTER — Ambulatory Visit: Payer: PRIVATE HEALTH INSURANCE | Attending: Physical Medicine and Rehabilitation | Admitting: Physical Therapy

## 2013-02-04 DIAGNOSIS — M545 Low back pain, unspecified: Secondary | ICD-10-CM | POA: Insufficient documentation

## 2013-02-04 DIAGNOSIS — IMO0001 Reserved for inherently not codable concepts without codable children: Secondary | ICD-10-CM | POA: Insufficient documentation

## 2013-02-06 ENCOUNTER — Ambulatory Visit: Payer: PRIVATE HEALTH INSURANCE | Attending: Physical Medicine and Rehabilitation | Admitting: Physical Therapy

## 2013-02-06 DIAGNOSIS — IMO0001 Reserved for inherently not codable concepts without codable children: Secondary | ICD-10-CM | POA: Insufficient documentation

## 2013-02-06 DIAGNOSIS — M545 Low back pain, unspecified: Secondary | ICD-10-CM | POA: Insufficient documentation

## 2013-02-11 ENCOUNTER — Ambulatory Visit: Payer: PRIVATE HEALTH INSURANCE | Attending: Physical Medicine and Rehabilitation | Admitting: Physical Therapy

## 2013-02-11 DIAGNOSIS — M545 Low back pain, unspecified: Secondary | ICD-10-CM | POA: Insufficient documentation

## 2013-02-11 DIAGNOSIS — IMO0001 Reserved for inherently not codable concepts without codable children: Secondary | ICD-10-CM | POA: Insufficient documentation

## 2013-02-12 ENCOUNTER — Ambulatory Visit: Payer: PRIVATE HEALTH INSURANCE | Attending: Physical Medicine and Rehabilitation | Admitting: Physical Therapy

## 2013-02-12 DIAGNOSIS — M545 Low back pain, unspecified: Secondary | ICD-10-CM | POA: Insufficient documentation

## 2013-02-12 DIAGNOSIS — IMO0001 Reserved for inherently not codable concepts without codable children: Secondary | ICD-10-CM | POA: Insufficient documentation

## 2013-02-18 ENCOUNTER — Ambulatory Visit: Payer: PRIVATE HEALTH INSURANCE | Admitting: Physical Therapy

## 2013-02-18 ENCOUNTER — Ambulatory Visit: Payer: PRIVATE HEALTH INSURANCE | Attending: Physical Medicine and Rehabilitation | Admitting: Physical Therapy

## 2013-02-18 DIAGNOSIS — M545 Low back pain, unspecified: Secondary | ICD-10-CM | POA: Insufficient documentation

## 2013-02-18 DIAGNOSIS — IMO0001 Reserved for inherently not codable concepts without codable children: Secondary | ICD-10-CM | POA: Insufficient documentation

## 2013-02-20 ENCOUNTER — Ambulatory Visit: Payer: PRIVATE HEALTH INSURANCE | Attending: Physical Medicine and Rehabilitation | Admitting: Physical Therapy

## 2013-02-20 DIAGNOSIS — M545 Low back pain, unspecified: Secondary | ICD-10-CM | POA: Insufficient documentation

## 2013-02-20 DIAGNOSIS — IMO0001 Reserved for inherently not codable concepts without codable children: Secondary | ICD-10-CM | POA: Insufficient documentation

## 2013-02-24 ENCOUNTER — Encounter: Payer: Self-pay | Admitting: Physical Therapy

## 2013-02-27 ENCOUNTER — Ambulatory Visit: Payer: PRIVATE HEALTH INSURANCE | Attending: Physical Medicine and Rehabilitation | Admitting: Physical Therapy

## 2013-02-27 DIAGNOSIS — M545 Low back pain, unspecified: Secondary | ICD-10-CM | POA: Insufficient documentation

## 2013-02-27 DIAGNOSIS — IMO0001 Reserved for inherently not codable concepts without codable children: Secondary | ICD-10-CM | POA: Insufficient documentation

## 2013-02-28 ENCOUNTER — Ambulatory Visit: Payer: PRIVATE HEALTH INSURANCE | Admitting: Rehabilitation

## 2013-03-03 ENCOUNTER — Ambulatory Visit: Payer: PRIVATE HEALTH INSURANCE | Admitting: Physical Therapy

## 2013-03-06 ENCOUNTER — Ambulatory Visit: Payer: PRIVATE HEALTH INSURANCE | Attending: Physical Medicine and Rehabilitation | Admitting: Physical Therapy

## 2013-03-06 DIAGNOSIS — IMO0001 Reserved for inherently not codable concepts without codable children: Secondary | ICD-10-CM | POA: Insufficient documentation

## 2013-03-06 DIAGNOSIS — M545 Low back pain, unspecified: Secondary | ICD-10-CM | POA: Insufficient documentation

## 2013-03-11 ENCOUNTER — Ambulatory Visit: Payer: PRIVATE HEALTH INSURANCE | Attending: Physical Medicine and Rehabilitation | Admitting: Physical Therapy

## 2013-03-11 DIAGNOSIS — M545 Low back pain, unspecified: Secondary | ICD-10-CM | POA: Insufficient documentation

## 2013-03-11 DIAGNOSIS — IMO0001 Reserved for inherently not codable concepts without codable children: Secondary | ICD-10-CM | POA: Insufficient documentation

## 2013-03-13 ENCOUNTER — Ambulatory Visit: Payer: PRIVATE HEALTH INSURANCE | Attending: Physical Medicine and Rehabilitation | Admitting: Physical Therapy

## 2013-03-13 DIAGNOSIS — M545 Low back pain, unspecified: Secondary | ICD-10-CM | POA: Insufficient documentation

## 2013-03-13 DIAGNOSIS — IMO0001 Reserved for inherently not codable concepts without codable children: Secondary | ICD-10-CM | POA: Insufficient documentation

## 2013-04-03 ENCOUNTER — Ambulatory Visit (INDEPENDENT_AMBULATORY_CARE_PROVIDER_SITE_OTHER): Payer: 59 | Admitting: Family Medicine

## 2013-04-03 ENCOUNTER — Encounter: Payer: Self-pay | Admitting: Family Medicine

## 2013-04-03 VITALS — BP 147/95 | HR 76 | Temp 98.7°F | Ht 64.0 in | Wt 216.5 lb

## 2013-04-03 DIAGNOSIS — IMO0002 Reserved for concepts with insufficient information to code with codable children: Secondary | ICD-10-CM

## 2013-04-03 DIAGNOSIS — J45909 Unspecified asthma, uncomplicated: Secondary | ICD-10-CM

## 2013-04-03 DIAGNOSIS — J069 Acute upper respiratory infection, unspecified: Secondary | ICD-10-CM

## 2013-04-03 MED ORDER — BREATHERITE COLL SPACER ADULT MISC: 1.0000 | Status: DC | PRN

## 2013-04-03 MED ORDER — ONDANSETRON 4 MG PO TBDP: 4.0000 mg | ORAL_TABLET | Freq: Three times a day (TID) | ORAL | Status: DC | PRN

## 2013-04-03 MED ORDER — FLUTICASONE-SALMETEROL 500-50 MCG/DOSE IN AEPB: 1.0000 | INHALATION_SPRAY | Freq: Two times a day (BID) | RESPIRATORY_TRACT | Status: DC

## 2013-04-03 NOTE — Assessment & Plan Note (Addendum)
Discussed that this is likely viral given timecourse and multiple symptoms Offered Rx of azithromycin if symptoms persisting in 4 days, would be 1 week total) Given zofran ODT for nausea, encouraged fluids, encouraged nasal saline via ayr or neti pot Discussed possibility of asthma exacerbation and reviewed reasons to seek emergency care or return.   F/u PRN

## 2013-04-03 NOTE — Assessment & Plan Note (Addendum)
Clear on exam for today, at risk for exacerbation due to viral URI Refilled advair, given spacer and discussed its benefits

## 2013-04-03 NOTE — Progress Notes (Signed)
  Subjective:    Patient ID: Donna Carlson, female    DOB: 07-Jan-1976, 37 y.o.   MRN: [id number]  HPI  Patient here for same-day appointment for cold and congestion  She describes 3 days of sinus pressure and yellow nasal discharge. States that yesterday she began having decreased PO intake and developed dizziness and weakness as well as nausea. Today she had to leave work due to her feeling so bad.   She states she felt feverish yesterday but measured 99.5.  She denies sore throat  Review of Systems Per HPI    Objective:   Physical Exam  Gen: NAD, alert, cooperative with exam HEENT: NCAT, MMM, no pharyngeal erythema or exudate, conchi swollen and boggy BL, no sinus tenderness on palpation,  CV: RRR, good S1/S2, no murmur Resp: CTABL, no wheezes, non-labored Neuro: Alert and oriented, No gross deficits     Assessment & Plan:

## 2013-04-03 NOTE — Patient Instructions (Signed)
It was great to meet you today!  Try mucinex DM Nasla saline- ayr/ocean, neti pot Drink lots of fluids and try to eat as tolerated I sent a zofran prescription for nausea Albuterol as needed, continue your advair  Call back in 4 days if your symptoms have not improved, feel free to return or call if your develop fever, cannot tolerate by mouth, or begin worsen.

## 2013-04-05 ENCOUNTER — Telehealth: Payer: Self-pay | Admitting: Family Medicine

## 2013-04-05 NOTE — Telephone Encounter (Signed)
Pt called [facility] emergency line complaining of increased SOB, chest tightness, productive cough, and subjective fever (checked to 99). Pt was seen two days ago by Dr. Ermalinda Memos and diagnosed with URI, started on azithromycin. SOB is worse even at rest and pt has not had relieve with nebulizers at home. Pt sounds slightly winded even with talking on the phone. Advised continued supportive care but also suggested that if pt feels she is getting worse and having difficulty breathing that she should be re-evaluated at urgent care or in the ED. Pt stated she had someone who could take her to one place or the other and did not feel like she needed EMS assistance. Pt voiced understanding and states she will go somewhere to get checked out, likely the ED. Advised [facility] over [facility] in order to to facilitate assumption of care by [facility] at [facility] in case of admission.

## 2013-04-30 ENCOUNTER — Other Ambulatory Visit: Payer: Self-pay | Admitting: *Deleted

## 2013-04-30 ENCOUNTER — Encounter (INDEPENDENT_AMBULATORY_CARE_PROVIDER_SITE_OTHER): Payer: 59

## 2013-04-30 DIAGNOSIS — M79609 Pain in unspecified limb: Secondary | ICD-10-CM

## 2013-04-30 DIAGNOSIS — R238 Other skin changes: Secondary | ICD-10-CM

## 2013-04-30 DIAGNOSIS — M7989 Other specified soft tissue disorders: Secondary | ICD-10-CM

## 2013-04-30 NOTE — Progress Notes (Signed)
PER DR COOPER VEINOUS DUPLEX OF PAINFUL LOWER EXTREMITY / WILL BE DONE TODAY.  PT AWARE.

## 2013-05-02 ENCOUNTER — Other Ambulatory Visit: Payer: Self-pay | Admitting: Family Medicine

## 2013-07-02 ENCOUNTER — Ambulatory Visit (INDEPENDENT_AMBULATORY_CARE_PROVIDER_SITE_OTHER): Payer: 59 | Admitting: Family Medicine

## 2013-07-02 ENCOUNTER — Encounter: Payer: Self-pay | Admitting: Family Medicine

## 2013-07-02 VITALS — BP 129/81 | HR 88 | Ht 64.0 in | Wt 218.0 lb

## 2013-07-02 DIAGNOSIS — I872 Venous insufficiency (chronic) (peripheral): Secondary | ICD-10-CM

## 2013-07-02 NOTE — Progress Notes (Signed)
Subjective:     Patient ID: Donna Carlson, female   DOB: October 18, 1975, 37 y.o.   MRN: [id number]  HPI 37 y.o. F presents for evaluation of unilateral left leg swelling that occurred after prolong standing in the cath lab. Pt has hx of prior similar symptoms and was previously evaluated for DVT for similar symptoms. Swelling resolves with elevating legs or sleeping. Pt initially concerned about some redness around site but improved with dec swelling.  No F/C, SOB, CP, n/v, hx of trauma, swelling at other sites, orthopnea or other complaints.  Review of Systems     Objective:   Physical Exam Filed Vitals:   07/02/13 1535  BP: 129/81  Pulse: 88   RRR no mgt CTAB no wrc No c/c. Trace edema of left leg. 2+ pulses. No erythema. Mild TTP along shin.     Assessment:     37 y.o. F w/ likely venous insuficiency based on hx and exam. Strongly doubt cardiac, renal or hepatic etiology. Unlikely DVT given resolution. Low probability   - compression stockings while at work - elevate legs when able

## 2013-07-10 ENCOUNTER — Encounter: Payer: Self-pay | Admitting: Family Medicine

## 2013-07-10 ENCOUNTER — Ambulatory Visit (INDEPENDENT_AMBULATORY_CARE_PROVIDER_SITE_OTHER): Payer: 59 | Admitting: Family Medicine

## 2013-07-10 VITALS — BP 131/83 | HR 91 | Temp 99.7°F | Ht 64.0 in | Wt 222.0 lb

## 2013-07-10 DIAGNOSIS — R5381 Other malaise: Secondary | ICD-10-CM

## 2013-07-10 DIAGNOSIS — R531 Weakness: Secondary | ICD-10-CM | POA: Insufficient documentation

## 2013-07-10 LAB — CBC
Hemoglobin: 12.5 g/dL (ref 12.0–15.0)
MCH: 28.3 pg (ref 26.0–34.0)
RBC: 4.41 MIL/uL (ref 3.87–5.11)
WBC: 7.1 10*3/uL (ref 4.0–10.5)

## 2013-07-10 LAB — BASIC METABOLIC PANEL
CO2: 25 mEq/L (ref 19–32)
Chloride: 102 mEq/L (ref 96–112)
Creat: 0.59 mg/dL (ref 0.50–1.10)
Potassium: 4.1 mEq/L (ref 3.5–5.3)

## 2013-07-10 NOTE — Assessment & Plan Note (Signed)
Physical exam is benign, except for mild goiter. Will obtain TSH, CBC, BMET and Preganancy test.

## 2013-07-10 NOTE — Progress Notes (Signed)
Family Medicine Office Visit Note   Subjective:   Patient ID: Donna Carlson, female  DOB: [birth year], 37 y.o.. MRN: [id number]   Pt that comes today for same day appointment complaining of generalized weakness of about 2 weeks' duration. She reports feeling tired and sleepy most of the day and was concerned about pregnancy. She has regular menses and her menstrual period happened twice in October. She has been sexually active and does not use birth control.  She also reports mood issues, but denies anhedonia, suicidal thoughts or plans. She reports there is non stressful situation at this time in her life that can attribute her symptoms.   Review of Systems:  Per HPI  Objective:   Physical Exam: Gen:  NAD HEENT: Moist mucous membranes. Neck with mild diffuse goiter, no nodularity. No adenopathies and supple. CV: Regular rate and rhythm, no murmurs rubs or gallops PULM: Clear to auscultation bilaterally. No wheezes/rales/rhonchi ABD: Soft, non tender, non distended, normal bowel sounds EXT: No edema Neuro: Alert and oriented x3. No focalization  Assessment & Plan:

## 2013-07-10 NOTE — Patient Instructions (Signed)
It has been a pleasure to see you today. I will call you with the labs results if they come back abnormal otherwise you will receive a letter.   

## 2013-07-11 LAB — TSH: TSH: 1.552 u[IU]/mL (ref 0.350–4.500)

## 2013-07-28 ENCOUNTER — Telehealth: Payer: Self-pay | Admitting: Family Medicine

## 2013-07-28 NOTE — Telephone Encounter (Signed)
Pt informed that she would need an appt to have this done.  She has not seen Dr. Gayla Doss before and he would have to fill out paperwork.  According to her work schedule she is unable to come in on his next available.  I let her know that she can try and call back to see if he has anyone cancel for tomorrow. Suad Autrey,CMA

## 2013-07-28 NOTE — Telephone Encounter (Signed)
Pt has FMLA paperwork to be completed. Can she just drop them off or does she need to be seen? Please advise

## 2013-07-29 ENCOUNTER — Encounter: Payer: Self-pay | Admitting: Family Medicine

## 2013-07-29 ENCOUNTER — Ambulatory Visit (INDEPENDENT_AMBULATORY_CARE_PROVIDER_SITE_OTHER): Payer: 59 | Admitting: Family Medicine

## 2013-07-29 VITALS — BP 130/88 | HR 79 | Temp 98.6°F | Ht 64.0 in | Wt 216.0 lb

## 2013-07-29 DIAGNOSIS — J45901 Unspecified asthma with (acute) exacerbation: Secondary | ICD-10-CM

## 2013-07-29 MED ORDER — PREDNISONE 50 MG PO TABS: ORAL_TABLET | ORAL | Status: DC

## 2013-07-29 MED ORDER — BREATHERITE COLL SPACER ADULT MISC: 1.0000 | Freq: Once | Status: DC

## 2013-07-29 NOTE — Assessment & Plan Note (Signed)
Pertinent S&O  Exacerbation caused by a viral URI.  inspiratory wheeze on exam; no tachypnea or hypoxia  Using a albuterol several times weekly without spacer prior to exacerbation Plan  prednisone 50 mg x 5 days  Given prescription for spacer for MDI  recommended Advair twice a day as prescribed (currently only using once daily)  Follow-up in one month to discuss need for adjusting controller medication

## 2013-07-29 NOTE — Progress Notes (Signed)
Subjective:     Patient ID: Donna Carlson, female   DOB: 05-Jun-1976, 37 y.o.   MRN: [id number]  HPI Comments: She reports URI symptoms starting Wednesday: sore throat, nasal congestion, cough.  Began having increased trouble breathing Saturday, requiring albuterol inhaler.  Had increased trouble sleeping due to to increased work of breathing, Sunday, and started using nebulizer.  Denies cigarette smoke. She is interested in discussing FMLA paperwork. Reports that this was completed in the past due to her asthma.  Asthma She complains of chest tightness, cough, difficulty breathing, shortness of breath and wheezing. There is no frequent throat clearing or sputum production. This is a chronic problem. The problem occurs daily. The cough is non-productive and dry. Associated symptoms include dyspnea on exertion, malaise/fatigue, nasal congestion, rhinorrhea and a sore throat. Pertinent negatives include no fever. Her symptoms are aggravated by URI. She reports minimal improvement on treatment. Her past medical history is significant for asthma.     Review of Systems  Constitutional: Positive for malaise/fatigue. Negative for fever.  HENT: Positive for congestion, rhinorrhea and sore throat.   Respiratory: Positive for cough, chest tightness, shortness of breath and wheezing. Negative for sputum production and stridor.   Cardiovascular: Positive for dyspnea on exertion.       Objective:   Physical Exam  Constitutional: She is oriented to person, place, and time. She appears well-developed and well-nourished.  HENT:  Nose: Mucosal edema and rhinorrhea present.  Mouth/Throat: Mucous membranes are dry. Posterior oropharyngeal erythema present. No oropharyngeal exudate.  Pulmonary/Chest: Effort normal. No respiratory distress. She has wheezes.  Neurological: She is alert and oriented to person, place, and time.  Skin: No rash noted.    Assessment/Plan:      See Problem Focused Assessment &  Plan

## 2013-07-29 NOTE — Patient Instructions (Signed)
It was great seeing you today.   1. Take prednisone for 5 days 2. Take Advair twice a day 3. Call in one month if still needing Albuterol weekly   Please bring all your medications to every doctors visit  Sign up for My Chart to have easy access to your labs results, and communication with your Primary care physician.   Please check-out at the front desk before leaving the clinic.   I look forward to talking with you again at our next visit. If you have any questions or concerns before then, please call the clinic at [phone].  Take Care,   Dr Wenda Low

## 2013-07-31 ENCOUNTER — Telehealth: Payer: Self-pay | Admitting: Family Medicine

## 2013-07-31 NOTE — Telephone Encounter (Signed)
Patient dropped off FMLA forms to be filled out.  Please call her when completed. °

## 2013-07-31 NOTE — Telephone Encounter (Signed)
Placed in MDs box. Fleeger, Jessica Dawn  

## 2013-08-04 NOTE — Telephone Encounter (Signed)
FMLA form completed by Dr. Gayla Doss.  Made copy and placed in "to be scanned" box.  Original placed at front desk.  Called patient and left message that form ready to pick up at front desk.  Gaylene Brooks, RN

## 2013-08-28 ENCOUNTER — Ambulatory Visit (INDEPENDENT_AMBULATORY_CARE_PROVIDER_SITE_OTHER): Payer: 59 | Admitting: Family Medicine

## 2013-08-28 ENCOUNTER — Encounter: Payer: Self-pay | Admitting: Family Medicine

## 2013-08-28 VITALS — BP 134/89 | HR 80 | Temp 99.3°F | Ht 64.0 in | Wt 219.0 lb

## 2013-08-28 DIAGNOSIS — R109 Unspecified abdominal pain: Secondary | ICD-10-CM

## 2013-08-28 DIAGNOSIS — R1030 Lower abdominal pain, unspecified: Secondary | ICD-10-CM

## 2013-08-28 NOTE — Progress Notes (Signed)
Family Medicine Office Visit Note   Subjective:   Patient ID: Donna Carlson, female  DOB: Apr 20, 1976, 38 y.o.. MRN: [id number]   Pt that comes today for same day appointment complaining of lower abdominal pain for 2 days about a week ago. Patient reports was having sex and felt sharp pain in her lower abdomen area. Pain was present for about 2 days after this episode and completely resolved. She denies vaginal discharge, fever, chills, nausea, vomiting, or other symptoms. She's not concerned about STDs since she uses condoms and is a stable relationship.   Review of Systems:  Pt denies SOB, chest pain, palpitations, headaches, dizziness, numbness or weakness. No changes on urinary or BM habits. No unintentional weigh loss/gain.  Objective:   Physical Exam: Gen:  NAD HEENT: Moist mucous membranes  CV: Regular rate and rhythm, no murmurs. PULM: Normal effort ABD: Soft, non tender, non distended, normal bowel sounds EXT: No edema Neuro: Alert and oriented x3. No focalization Vulva and perianal area: Normal  Speculum: Vagina and cervix of normal appearance, no friability, no discharge, patient has her period at this time. No clots coming from os.  Bimanual exam: Uterus anteverted, no adnexal masses. No cervical motion tenderness.  Assessment & Plan:

## 2013-08-29 DIAGNOSIS — R1030 Lower abdominal pain, unspecified: Secondary | ICD-10-CM | POA: Insufficient documentation

## 2013-08-29 NOTE — Patient Instructions (Signed)
It has been a pleasure to see you today. No further work up is needed at this time since your symptoms have resolved and exam is reassuring.  Make your next appointment as scheduled.

## 2013-08-29 NOTE — Assessment & Plan Note (Signed)
Pain during and after sexual intercourse that resolved completely. No motion tenderness or signs on exam of PID or acute abdomen on exam today.  Instructed pt to come back if pain repeats or other concerning signs/symptoms appear. No further work up recommended at this time. F/u as needed.

## 2013-09-26 ENCOUNTER — Ambulatory Visit: Payer: PRIVATE HEALTH INSURANCE

## 2013-10-03 ENCOUNTER — Ambulatory Visit: Payer: PRIVATE HEALTH INSURANCE | Attending: Physician Assistant

## 2013-10-03 DIAGNOSIS — IMO0001 Reserved for inherently not codable concepts without codable children: Secondary | ICD-10-CM | POA: Insufficient documentation

## 2013-10-03 DIAGNOSIS — R5381 Other malaise: Secondary | ICD-10-CM | POA: Insufficient documentation

## 2013-10-03 DIAGNOSIS — M25559 Pain in unspecified hip: Secondary | ICD-10-CM | POA: Insufficient documentation

## 2013-10-03 DIAGNOSIS — J45909 Unspecified asthma, uncomplicated: Secondary | ICD-10-CM | POA: Insufficient documentation

## 2013-10-03 DIAGNOSIS — M545 Low back pain, unspecified: Secondary | ICD-10-CM | POA: Insufficient documentation

## 2013-10-13 ENCOUNTER — Ambulatory Visit: Payer: 59 | Attending: Physician Assistant | Admitting: Physical Therapy

## 2013-10-13 DIAGNOSIS — M25559 Pain in unspecified hip: Secondary | ICD-10-CM | POA: Insufficient documentation

## 2013-10-13 DIAGNOSIS — IMO0001 Reserved for inherently not codable concepts without codable children: Secondary | ICD-10-CM | POA: Insufficient documentation

## 2013-10-13 DIAGNOSIS — R5381 Other malaise: Secondary | ICD-10-CM | POA: Insufficient documentation

## 2013-10-13 DIAGNOSIS — M545 Low back pain, unspecified: Secondary | ICD-10-CM | POA: Insufficient documentation

## 2013-10-16 ENCOUNTER — Encounter: Payer: Self-pay | Admitting: Physical Therapy

## 2013-10-22 ENCOUNTER — Ambulatory Visit: Payer: 59 | Attending: Physician Assistant | Admitting: Physical Therapy

## 2013-10-22 DIAGNOSIS — R5381 Other malaise: Secondary | ICD-10-CM | POA: Insufficient documentation

## 2013-10-22 DIAGNOSIS — IMO0001 Reserved for inherently not codable concepts without codable children: Secondary | ICD-10-CM | POA: Insufficient documentation

## 2013-10-22 DIAGNOSIS — M25559 Pain in unspecified hip: Secondary | ICD-10-CM | POA: Insufficient documentation

## 2013-10-22 DIAGNOSIS — M545 Low back pain, unspecified: Secondary | ICD-10-CM | POA: Insufficient documentation

## 2013-10-23 ENCOUNTER — Encounter: Payer: Self-pay | Admitting: Physical Therapy

## 2013-10-29 ENCOUNTER — Ambulatory Visit: Payer: 59 | Admitting: Physical Therapy

## 2013-10-31 ENCOUNTER — Ambulatory Visit: Payer: 59 | Admitting: Physical Therapy

## 2013-11-03 ENCOUNTER — Ambulatory Visit: Payer: 59 | Admitting: Physical Therapy

## 2013-11-07 ENCOUNTER — Encounter: Payer: Self-pay | Admitting: Physical Therapy

## 2013-11-11 ENCOUNTER — Encounter: Payer: Self-pay | Admitting: Physical Therapy

## 2013-11-12 ENCOUNTER — Encounter: Payer: Self-pay | Admitting: Physical Therapy

## 2013-12-31 ENCOUNTER — Ambulatory Visit: Payer: Self-pay

## 2014-01-01 ENCOUNTER — Ambulatory Visit (INDEPENDENT_AMBULATORY_CARE_PROVIDER_SITE_OTHER): Payer: 59 | Admitting: Family Medicine

## 2014-01-01 ENCOUNTER — Encounter: Payer: Self-pay | Admitting: Family Medicine

## 2014-01-01 ENCOUNTER — Telehealth: Payer: Self-pay | Admitting: Family Medicine

## 2014-01-01 VITALS — BP 126/66 | HR 88 | Temp 99.1°F | Ht 64.0 in | Wt 224.6 lb

## 2014-01-01 DIAGNOSIS — J45901 Unspecified asthma with (acute) exacerbation: Secondary | ICD-10-CM

## 2014-01-01 DIAGNOSIS — J069 Acute upper respiratory infection, unspecified: Secondary | ICD-10-CM

## 2014-01-01 DIAGNOSIS — J45909 Unspecified asthma, uncomplicated: Secondary | ICD-10-CM

## 2014-01-01 DIAGNOSIS — IMO0002 Reserved for concepts with insufficient information to code with codable children: Secondary | ICD-10-CM

## 2014-01-01 MED ORDER — ALBUTEROL SULFATE (2.5 MG/3ML) 0.083% IN NEBU: 2.5000 mg | INHALATION_SOLUTION | RESPIRATORY_TRACT | Status: DC | PRN

## 2014-01-01 MED ORDER — PREDNISONE 50 MG PO TABS: ORAL_TABLET | ORAL | Status: DC

## 2014-01-01 MED ORDER — FLUTICASONE-SALMETEROL 500-50 MCG/DOSE IN AEPB: 1.0000 | INHALATION_SPRAY | Freq: Two times a day (BID) | RESPIRATORY_TRACT | Status: DC

## 2014-01-01 MED ORDER — ALBUTEROL SULFATE HFA 108 (90 BASE) MCG/ACT IN AERS: 2.0000 | INHALATION_SPRAY | Freq: Two times a day (BID) | RESPIRATORY_TRACT | Status: DC | PRN

## 2014-01-01 NOTE — Patient Instructions (Signed)
1) for the asthma - lets do steroids for 5 days - let me know if things don't get better or get worse - I refilled your other medicines as well  2) because allergies seemed to start this, try taking a daily allergy medicine.  Claritin (loratidine) or zyrtec (cetirizine) are good choices.

## 2014-01-01 NOTE — Telephone Encounter (Signed)
Done

## 2014-01-01 NOTE — Assessment & Plan Note (Signed)
Currently with exacerbation. - normally well controlled on advair and albuterol - O2 sat WNL - rx of prednisone burst x 5 days - recommend daily allergy medicine also as this seems to worsen when her allergies flare.  - return if worsening sob or no improvement.  - will need to f/u with PCP once stable also for chronic management.

## 2014-01-01 NOTE — Telephone Encounter (Signed)
Pt called and wanted Dr. Olevia Bowens to also call in albuterol 2.5 nebulizer solution also to [facility]. jw

## 2014-01-01 NOTE — Progress Notes (Signed)
Subjective:    Patient ID: Donna Carlson, female    DOB: 22-Aug-1975, 38 y.o.   MRN: [id number]  HPI  38 yo with a hx of moderate persistent asthma who presents today for worsening SOB.    - started getting worsening sob starting on Sunday - used her inhaler throughout the day and it seemed to help some - Monday seemed to be worse - yesterday got even worse had to use a nebulizer - today has already used her albuterol 5 times  - worse going outside and with activity - keeping house at 60 degrees to make it better.   - has been having allergies and continued drainage down her throat.  - not taking allergy medicine  - no fevers, chills, nausea, vomiting, diarrhea, constipation, headache, cough, no dyspnea with laying flat  - last hospitalization: 2006 - never intubated - when not having a flare only needs inhaler once or twice a month.   Past Medical History  Diagnosis Date  . Palpitations   . Asthma    Family History  Problem Relation Age of Onset  . Hypertension Mother   . Hypertension Maternal Grandmother   . Heart attack Paternal Grandfather     History   Social History  . Marital Status: Single    Spouse Name: N/A    Number of Children: N/A  . Years of Education: N/A   Occupational History  . Not on file.   Social History Main Topics  . Smoking status: Never Smoker   . Smokeless tobacco: Not on file  . Alcohol Use: Yes  . Drug Use: No  . Sexual Activity: Yes    Birth Control/ Protection: None   Other Topics Concern  . Not on file   Social History Narrative  . No narrative on file   Current Outpatient Prescriptions on File Prior to Visit  Medication Sig Dispense Refill  . albuterol (PROVENTIL) (2.5 MG/3ML) 0.083% nebulizer solution Take 2.5 mg by nebulization every 4 (four) hours as needed for wheezing or shortness of breath.      . Aspirin-Acetaminophen (GOODYS BODY PAIN PO) Take 1 packet by mouth 2 (two) times daily as needed (for pain).        . cyclobenzaprine (FLEXERIL) 10 MG tablet Take 1 tablet (10 mg total) by mouth 2 (two) times daily as needed for muscle spasms.  20 tablet  0  . ibuprofen (ADVIL,MOTRIN) 200 MG tablet Take 400 mg by mouth 2 (two) times daily as needed for pain.      Marland Kitchen ondansetron (ZOFRAN ODT) 4 MG disintegrating tablet Take 1 tablet (4 mg total) by mouth every 8 (eight) hours as needed for nausea.  20 tablet  0  . oxyCODONE-acetaminophen (PERCOCET/ROXICET) 5-325 MG per tablet Take 1-2 tablets by mouth every 6 (six) hours as needed for pain.  20 tablet  0  . Spacer/Aero-Holding Chambers (BREATHERITE COLL SPACER ADULT) MISC 1 each by Does not apply route as needed.  1 each  0  . Spacer/Aero-Holding Chambers (BREATHERITE COLL SPACER ADULT) MISC 1 applicator by Does not apply route once.  1 each  0   No current facility-administered medications on file prior to visit.   Allergies  Allergen Reactions  . Shellfish Allergy Anaphylaxis  . Iodine Rash      Review of Systems See above     Objective:   Physical Exam  Constitutional: She appears well-developed and well-nourished.  HENT:  Head: Normocephalic.  Nose: Nose normal.  Mouth/Throat:  Oropharynx is clear and moist.  Eyes: Conjunctivae are normal.  Neck: Neck supple.  Cardiovascular: Normal rate, regular rhythm and normal heart sounds.   Pulmonary/Chest: No respiratory distress. She has wheezes (inspiratory and expiratory wheezes diffusely throughout).  Abdominal: Soft.  Skin: Skin is warm and dry.  Psychiatric: She has a normal mood and affect.   Filed Vitals:   01/01/14 1347  BP: 126/66  Pulse: 88  Temp: 99.1 F (37.3 C)  TempSrc: Oral  Height: 5\' 4"  (1.626 m)  Weight: 224 lb 9.6 oz (101.878 kg)  SpO2: 98%       Assessment & Plan:   Asthma, persistent - Plan: Fluticasone-Salmeterol (ADVAIR) 500-50 MCG/DOSE AEPB  ASTHMA NOS W/ACUTE EXACERBATION - Plan: predniSONE (DELTASONE) 50 MG tablet  URI (upper respiratory infection) - Plan:  Fluticasone-Salmeterol (ADVAIR) 500-50 MCG/DOSE AEPB  See problem based plan.

## 2014-04-24 ENCOUNTER — Encounter: Payer: Self-pay | Admitting: Family Medicine

## 2014-04-24 ENCOUNTER — Ambulatory Visit (INDEPENDENT_AMBULATORY_CARE_PROVIDER_SITE_OTHER): Payer: 59 | Admitting: Family Medicine

## 2014-04-24 VITALS — BP 124/80 | HR 73 | Temp 97.8°F | Ht 64.0 in | Wt 216.3 lb

## 2014-04-24 DIAGNOSIS — J45901 Unspecified asthma with (acute) exacerbation: Secondary | ICD-10-CM

## 2014-04-24 DIAGNOSIS — J029 Acute pharyngitis, unspecified: Secondary | ICD-10-CM

## 2014-04-24 DIAGNOSIS — J020 Streptococcal pharyngitis: Secondary | ICD-10-CM

## 2014-04-24 LAB — POCT RAPID STREP A (OFFICE): RAPID STREP A SCREEN: POSITIVE — AB

## 2014-04-24 MED ORDER — PENICILLIN V POTASSIUM 500 MG PO TABS: 500.0000 mg | ORAL_TABLET | Freq: Four times a day (QID) | ORAL | Status: AC

## 2014-04-24 MED ORDER — FLUTICASONE PROPIONATE 50 MCG/ACT NA SUSP: 2.0000 | Freq: Every day | NASAL | Status: DC

## 2014-04-24 MED ORDER — PREDNISONE 20 MG PO TABS: 40.0000 mg | ORAL_TABLET | Freq: Every day | ORAL | Status: DC

## 2014-04-24 NOTE — Patient Instructions (Signed)
It was a pleasure to see you.  I believe your asthma may be triggered by your nasal symptoms.   For the asthma flare, PREDNISONE 20mg  tablets, take 2 tablets by mouth one time daily (with something to eat), for 7 days. Note that the prescription is enough for 10 days, in the event you need a couple extra days before you get to see Dr Berkley Harvey.   I am treating your nasal symptoms with FLONASE 2 sprays in each nostril, one time daily in the morning. Use nasal saline to wash your nose out before you use the FLONASE.    I would like you to return to the office to see Dr Berkley Harvey in the coming 2 weeks.

## 2014-04-24 NOTE — Assessment & Plan Note (Signed)
Patient with acute exacerbation asthma, triggers are weather changes.  Last flare May 2015, requiring short course prednisone. Associated pharyngitis, phlegm without  Cough or objective fevers. Rapid strep done in office today.  Treatment of asthma flare with prednisone 40mg  daily for 7 days. Continue using Advair. Treatment of associated rhinorrhea with intranasal fluticasone.  No evidence of bacterial sinusitis. Short-term follow up with primary physician. Consideration of adjunct therapy with Singulair during periods of weather change (spring and fall).

## 2014-04-24 NOTE — Progress Notes (Signed)
   Subjective:    Patient ID: Donna Carlson, female    DOB: Aug 05, 1976, 38 y.o.   MRN: [id number]  HPI Patient here for SDA for sore throat and yellow phlegm, nasal discharge over the past 6 days. Felt sweaty but no objective fevers. No cough. On Sept 2nd she started with wheezing and some shortness of breath. Has used albuterol HFA one time daily for the past 3 weeks, trigger is weather change. Non-smoker, no pets. Was last hospitalized for asthma around 7 yrs ago, never intubated. 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    Review of Systems     Objective:   Physical Exam Well appearing, no apparent distress HEENT Neck supple with notable cervical adenopathy. Erythematous oropharynx without exudates. Injected conjunctivae. TMs clear. No frontal or maxillary sinus tenderness. Nasal mucosa with watery rhinorrhea.  COR Regular S1S2, no extra sounds PULM Good air movement, diffuse wheezes that improve with subsequent effort. No rales.        Assessment & Plan:

## 2014-04-24 NOTE — Assessment & Plan Note (Signed)
GAS by rapid strep testing. PCN V 500mg  twice daily for 10 days.

## 2014-05-25 ENCOUNTER — Ambulatory Visit (INDEPENDENT_AMBULATORY_CARE_PROVIDER_SITE_OTHER): Payer: 59 | Admitting: Family Medicine

## 2014-05-25 ENCOUNTER — Encounter: Payer: Self-pay | Admitting: Family Medicine

## 2014-05-25 VITALS — BP 138/90 | Temp 98.3°F | Ht 64.0 in | Wt 221.5 lb

## 2014-05-25 DIAGNOSIS — J4531 Mild persistent asthma with (acute) exacerbation: Secondary | ICD-10-CM

## 2014-05-25 MED ORDER — PREDNISONE 50 MG PO TABS: ORAL_TABLET | ORAL | Status: DC

## 2014-05-25 MED ORDER — MONTELUKAST SODIUM 10 MG PO TABS: 10.0000 mg | ORAL_TABLET | Freq: Every day | ORAL | Status: DC

## 2014-05-25 MED ORDER — ALBUTEROL SULFATE HFA 108 (90 BASE) MCG/ACT IN AERS: 2.0000 | INHALATION_SPRAY | Freq: Two times a day (BID) | RESPIRATORY_TRACT | Status: DC | PRN

## 2014-05-25 NOTE — Patient Instructions (Signed)
Please take Prednisone one tablet daily for the next 5 days, Start Singulair to help prevent flairs of your asthma.

## 2014-05-25 NOTE — Assessment & Plan Note (Signed)
38 y/o female with asthma exacerbation -start prednisone burst for 5 days -start daily Singulair in addition to home Advair -routine follow up with PCP -return precautions given

## 2014-05-25 NOTE — Progress Notes (Signed)
   Subjective:    Patient ID: Candis Schatz, female    DOB: [birth year], 38 y.o.   MRN: [id number]  HPI 38 y/o female presents with increased work of breathing and cough for the past 2-3 days, no associated fevers and chills, thinks she is having an asthma exacerbation, treated for exacerbation one month ago with prednisone, using albuterol 2-3 times daily, talking Advair twice daily, no runny nose/sore throat   Review of Systems  Constitutional: Negative for fever, chills and fatigue.  Respiratory: Positive for cough, shortness of breath and wheezing.   Cardiovascular: Negative for chest pain.       Objective:   Physical Exam Vitals: reviewed, BP improved on recheck Gen: pleasant AAF, NAD HEENT: normocephalic, PERRL, EOMI, no scleral icterus, MMM, neck supple, no anterior or posterior cervical lymphadenopathy Cardiac:RRR, S1 and S2 present, no murmurs, no heaves/thrills Resp: inspiratory and expiratory wheezes, air entry present in bases      Assessment & Plan:  Please see problem specific assessment and plan.

## 2014-06-14 ENCOUNTER — Emergency Department (HOSPITAL_COMMUNITY)
Admission: EM | Admit: 2014-06-14 | Discharge: 2014-06-14 | Disposition: A | Discharge status: Home / Self Care | Payer: 59 | Attending: Emergency Medicine | Admitting: Emergency Medicine

## 2014-06-14 ENCOUNTER — Encounter (HOSPITAL_COMMUNITY): Payer: Self-pay | Admitting: Emergency Medicine

## 2014-06-14 ENCOUNTER — Emergency Department (HOSPITAL_COMMUNITY): Payer: 59

## 2014-06-14 DIAGNOSIS — R509 Fever, unspecified: Secondary | ICD-10-CM | POA: Diagnosis not present

## 2014-06-14 DIAGNOSIS — Z7951 Long term (current) use of inhaled steroids: Secondary | ICD-10-CM | POA: Insufficient documentation

## 2014-06-14 DIAGNOSIS — R0602 Shortness of breath: Secondary | ICD-10-CM | POA: Diagnosis present

## 2014-06-14 DIAGNOSIS — J45901 Unspecified asthma with (acute) exacerbation: Secondary | ICD-10-CM | POA: Diagnosis not present

## 2014-06-14 DIAGNOSIS — Z79899 Other long term (current) drug therapy: Secondary | ICD-10-CM | POA: Diagnosis not present

## 2014-06-14 LAB — I-STAT CHEM 8, ED
BUN: 11 mg/dL (ref 6–23)
CALCIUM ION: 1.15 mmol/L (ref 1.12–1.23)
Chloride: 105 mEq/L (ref 96–112)
Creatinine, Ser: 0.6 mg/dL (ref 0.50–1.10)
GLUCOSE: 113 mg/dL — AB (ref 70–99)
HEMATOCRIT: 37 % (ref 36.0–46.0)
HEMOGLOBIN: 12.6 g/dL (ref 12.0–15.0)
Potassium: 4.1 mEq/L (ref 3.7–5.3)
Sodium: 139 mEq/L (ref 137–147)
TCO2: 23 mmol/L (ref 0–100)

## 2014-06-14 LAB — CBC WITH DIFFERENTIAL/PLATELET
BASOS ABS: 0 10*3/uL (ref 0.0–0.1)
BASOS PCT: 0 % (ref 0–1)
Eosinophils Absolute: 0.3 10*3/uL (ref 0.0–0.7)
Eosinophils Relative: 5 % (ref 0–5)
HCT: 34.8 % — ABNORMAL LOW (ref 36.0–46.0)
Hemoglobin: 11.2 g/dL — ABNORMAL LOW (ref 12.0–15.0)
Lymphocytes Relative: 31 % (ref 12–46)
Lymphs Abs: 1.8 10*3/uL (ref 0.7–4.0)
MCH: 27.7 pg (ref 26.0–34.0)
MCHC: 32.2 g/dL (ref 30.0–36.0)
MCV: 86.1 fL (ref 78.0–100.0)
Monocytes Absolute: 0.4 10*3/uL (ref 0.1–1.0)
Monocytes Relative: 7 % (ref 3–12)
NEUTROS ABS: 3.2 10*3/uL (ref 1.7–7.7)
NEUTROS PCT: 57 % (ref 43–77)
PLATELETS: 201 10*3/uL (ref 150–400)
RBC: 4.04 MIL/uL (ref 3.87–5.11)
RDW: 14.7 % (ref 11.5–15.5)
WBC: 5.7 10*3/uL (ref 4.0–10.5)

## 2014-06-14 MED ORDER — ALBUTEROL SULFATE (2.5 MG/3ML) 0.083% IN NEBU
2.5000 mg | INHALATION_SOLUTION | Freq: Once | RESPIRATORY_TRACT | Status: AC
  Administered 2014-06-14: 2.5 mg via RESPIRATORY_TRACT
  Filled 2014-06-14: qty 3

## 2014-06-14 MED ORDER — IPRATROPIUM BROMIDE 0.02 % IN SOLN
0.5000 mg | Freq: Once | RESPIRATORY_TRACT | Status: AC
Start: 2014-06-14 — End: 2014-06-14
  Administered 2014-06-14: 0.5 mg via RESPIRATORY_TRACT
  Filled 2014-06-14: qty 2.5

## 2014-06-14 MED ORDER — ALBUTEROL SULFATE (2.5 MG/3ML) 0.083% IN NEBU
5.0000 mg | INHALATION_SOLUTION | Freq: Once | RESPIRATORY_TRACT | Status: AC
  Administered 2014-06-14: 5 mg via RESPIRATORY_TRACT
  Filled 2014-06-14: qty 6

## 2014-06-14 MED ORDER — METHYLPREDNISOLONE SODIUM SUCC 125 MG IJ SOLR
125.0000 mg | Freq: Once | INTRAMUSCULAR | Status: AC
  Administered 2014-06-14: 125 mg via INTRAVENOUS
  Filled 2014-06-14: qty 2

## 2014-06-14 MED ORDER — PREDNISONE 10 MG PO TABS: ORAL_TABLET | ORAL | Status: DC

## 2014-06-14 NOTE — ED Provider Notes (Signed)
Medical screening examination/treatment/procedure(s) were conducted as a shared visit with non-physician practitioner(s) and myself.  I personally evaluated the patient during the encounter  Please see my separate respective documentation pertaining to this patient encounter   Johnna Acosta, MD 06/14/14 2004

## 2014-06-14 NOTE — ED Provider Notes (Signed)
38 year old female, history of asthma with frequent asthma exacerbations in the last couple of months, has been on prednisone a couple of times over the last month and presents with recurrent shortness of breath with wheezing. On exam the patient has diffuse mild expiratory wheezing, no peripheral edema, she is not on oral contraceptives, she has not been traveling and has no other risk factors for pulmonary embolism. During nebulized treatment the patient has mild wheezing, speaks in full sentences, pulse of 90, oxygen of 100% and normal blood pressure without fever. Appears as if she is improving appropriately on medications, anticipated discharge is home.  Medical screening examination/treatment/procedure(s) were conducted as a shared visit with non-physician practitioner(s) and myself.  I personally evaluated the patient during the encounter.  Clinical Impression:   Final diagnoses:  Asthma attack         Johnna Acosta, MD 06/14/14 2004

## 2014-06-14 NOTE — ED Provider Notes (Signed)
CSN: [id number]     Arrival date & time 06/14/14  6644 History   First MD Initiated Contact with Patient 06/14/14 [phone]     Chief Complaint  Patient presents with  . Wheezing  . Shortness of Breath  . Fever  . Asthma     (Consider location/radiation/quality/duration/timing/severity/associated sxs/prior Treatment) Patient is a 38 y.o. female presenting with shortness of breath. The history is provided by the patient. No language interpreter was used.  Shortness of Breath Severity:  Moderate Onset quality:  Gradual Duration:  1 day Timing:  Constant Progression:  Worsening Chronicity:  New Context: activity   Relieved by:  Nothing Worsened by:  Nothing tried Ineffective treatments:  Inhaler Associated symptoms: wheezing     Past Medical History  Diagnosis Date  . Palpitations   . Asthma    Past Surgical History  Procedure Laterality Date  . Gallbladder surgery    . Tubal ligation     Family History  Problem Relation Age of Onset  . Hypertension Mother   . Hypertension Maternal Grandmother   . Heart attack Paternal Grandfather    History  Substance Use Topics  . Smoking status: Never Smoker   . Smokeless tobacco: Not on file  . Alcohol Use: Yes   OB History   Grav Para Term Preterm Abortions TAB SAB Ect Mult Living                 Review of Systems  Respiratory: Positive for shortness of breath and wheezing.   All other systems reviewed and are negative.     Allergies  Shellfish allergy and Iodine  Home Medications   Prior to Admission medications   Medication Sig Start Date End Date Taking? Authorizing Provider  albuterol (PROVENTIL HFA;VENTOLIN HFA) 108 (90 BASE) MCG/ACT inhaler Inhale 2 puffs into the lungs 2 (two) times daily as needed for wheezing or shortness of breath. 05/25/14   Lupita Dawn, MD  albuterol (PROVENTIL) (2.5 MG/3ML) 0.083% nebulizer solution Take 3 mLs (2.5 mg total) by nebulization every 4 (four) hours as needed for wheezing  or shortness of breath. 01/01/14   Kassie Mends, MD  fluticasone (FLONASE) 50 MCG/ACT nasal spray Place 2 sprays into both nostrils daily. 04/24/14   Willeen Niece, MD  Fluticasone-Salmeterol (ADVAIR) 500-50 MCG/DOSE AEPB Inhale 1 puff into the lungs 2 (two) times daily. 01/01/14   Kassie Mends, MD  ibuprofen (ADVIL,MOTRIN) 200 MG tablet Take 400 mg by mouth 2 (two) times daily as needed for pain.    Historical Provider, MD  montelukast (SINGULAIR) 10 MG tablet Take 1 tablet (10 mg total) by mouth at bedtime. 05/25/14   Lupita Dawn, MD  predniSONE (DELTASONE) 50 MG tablet Take one tablet daily for the next 5 days. 05/25/14   Lupita Dawn, MD  Spacer/Aero-Holding Chambers (BREATHERITE COLL SPACER ADULT) MISC 1 each by Does not apply route as needed. 04/03/13   Timmothy Euler, MD  Spacer/Aero-Holding Chambers (BREATHERITE COLL SPACER ADULT) MISC 1 applicator by Does not apply route once. 07/29/13   Olam Idler, MD   LMP 05/15/2014 Physical Exam  Nursing note and vitals reviewed. Constitutional: She appears well-developed and well-nourished.  HENT:  Head: Normocephalic.  Right Ear: External ear normal.  Left Ear: External ear normal.  Nose: Nose normal.  Mouth/Throat: Oropharynx is clear and moist.  Eyes: Conjunctivae and EOM are normal. Pupils are equal, round, and reactive to light.  Neck: Normal range of motion. Neck supple.  Cardiovascular: Normal rate and normal heart sounds.   Pulmonary/Chest: She has wheezes.  wheezing all lobes,    Abdominal: Soft.    ED Course  Procedures (including critical care time) Labs Review Labs Reviewed  CBC WITH DIFFERENTIAL  I-STAT CHEM 8, ED   Pt better after albuterol atrovent neb.   Pt given solumedrol IV.   @nd  neb,   Lungs clear,   Imaging Review No results found.   EKG Interpretation None     Labs reviewed, images reviewed:  Labs Reviewed  CBC WITH DIFFERENTIAL - Abnormal; Notable for the following:    Hemoglobin 11.2 (*)    HCT  34.8 (*)    All other components within normal limits  I-STAT CHEM 8, ED - Abnormal; Notable for the following:    Glucose, Bld 113 (*)    All other components within normal limits    Results for orders placed during the hospital encounter of 06/14/14  CBC WITH DIFFERENTIAL      Result Value Ref Range   WBC 5.7  4.0 - 10.5 K/uL   RBC 4.04  3.87 - 5.11 MIL/uL   Hemoglobin 11.2 (*) 12.0 - 15.0 g/dL   HCT 34.8 (*) 36.0 - 46.0 %   MCV 86.1  78.0 - 100.0 fL   MCH 27.7  26.0 - 34.0 pg   MCHC 32.2  30.0 - 36.0 g/dL   RDW 14.7  11.5 - 15.5 %   Platelets 201  150 - 400 K/uL   Neutrophils Relative % 57  43 - 77 %   Neutro Abs 3.2  1.7 - 7.7 K/uL   Lymphocytes Relative 31  12 - 46 %   Lymphs Abs 1.8  0.7 - 4.0 K/uL   Monocytes Relative 7  3 - 12 %   Monocytes Absolute 0.4  0.1 - 1.0 K/uL   Eosinophils Relative 5  0 - 5 %   Eosinophils Absolute 0.3  0.0 - 0.7 K/uL   Basophils Relative 0  0 - 1 %   Basophils Absolute 0.0  0.0 - 0.1 K/uL  I-STAT CHEM 8, ED      Result Value Ref Range   Sodium 139  137 - 147 mEq/L   Potassium 4.1  3.7 - 5.3 mEq/L   Chloride 105  96 - 112 mEq/L   BUN 11  6 - 23 mg/dL   Creatinine, Ser 0.60  0.50 - 1.10 mg/dL   Glucose, Bld 113 (*) 70 - 99 mg/dL   Calcium, Ion 1.15  1.12 - 1.23 mmol/L   TCO2 23  0 - 100 mmol/L   Hemoglobin 12.6  12.0 - 15.0 g/dL   HCT 37.0  36.0 - 46.0 %   Dg Chest 2 View  06/14/2014   CLINICAL DATA:  Asthma flare up today. Patient was shortness of breath and wheezing. There is also fever. Fever 102.  EXAM: CHEST  2 VIEW  COMPARISON:  11/26/2012  FINDINGS: Normal heart, mediastinum and hila.  Lungs are clear.  No pleural effusion or pneumothorax.  Bony thorax and soft tissues are unremarkable.  IMPRESSION: Normal chest radiographs.   Electronically Signed   By: Lajean Manes M.D.   On: 06/14/2014 08:34    MDM  patient given Rx for prednisone taper 60 50 40 30 20 10  . She is encouraged to continue using albuterol inhaler she is advised  to follow-up with her primary care physician tomorrow if not improving she is advised to return to emergency department  if symptoms worsen or change    Final diagnoses:  SOB (shortness of breath)  Fever    AVS    Fransico Meadow, PA-C 06/14/14 1101

## 2014-06-14 NOTE — Discharge Instructions (Signed)
Asthma Attack Prevention Although there is no way to prevent asthma from starting, you can take steps to control the disease and reduce its symptoms. Learn about your asthma and how to control it. Take an active role to control your asthma by working with your health care provider to create and follow an asthma action plan. An asthma action plan guides you in:  Taking your medicines properly.  Avoiding things that set off your asthma or make your asthma worse (asthma triggers).  Tracking your level of asthma control.  Responding to worsening asthma.  Seeking emergency care when needed. To track your asthma, keep records of your symptoms, check your peak flow number using a handheld device that shows how well air moves out of your lungs (peak flow meter), and get regular asthma checkups.  WHAT ARE SOME WAYS TO PREVENT AN ASTHMA ATTACK?  Take medicines as directed by your health care provider.  Keep track of your asthma symptoms and level of control.  With your health care provider, write a detailed plan for taking medicines and managing an asthma attack. Then be sure to follow your action plan. Asthma is an ongoing condition that needs regular monitoring and treatment.  Identify and avoid asthma triggers. Many outdoor allergens and irritants (such as pollen, mold, cold air, and air pollution) can trigger asthma attacks. Find out what your asthma triggers are and take steps to avoid them.  Monitor your breathing. Learn to recognize warning signs of an attack, such as coughing, wheezing, or shortness of breath. Your lung function may decrease before you notice any signs or symptoms, so regularly measure and record your peak airflow with a home peak flow meter.  Identify and treat attacks early. If you act quickly, you are less likely to have a severe attack. You will also need less medicine to control your symptoms. When your peak flow measurements decrease and alert you to an upcoming attack,  take your medicine as instructed and immediately stop any activity that may have triggered the attack. If your symptoms do not improve, get medical help.  Pay attention to increasing quick-relief inhaler use. If you find yourself relying on your quick-relief inhaler, your asthma is not under control. See your health care provider about adjusting your treatment. WHAT CAN MAKE MY SYMPTOMS WORSE? A number of common things can set off or make your asthma symptoms worse and cause temporary increased inflammation of your airways. Keep track of your asthma symptoms for several weeks, detailing all the environmental and emotional factors that are linked with your asthma. When you have an asthma attack, go back to your asthma diary to see which factor, or combination of factors, might have contributed to it. Once you know what these factors are, you can take steps to control many of them. If you have allergies and asthma, it is important to take asthma prevention steps at home. Minimizing contact with the substance to which you are allergic will help prevent an asthma attack. Some triggers and ways to avoid these triggers are: Animal Dander:  Some people are allergic to the flakes of skin or dried saliva from animals with fur or feathers.   There is no such thing as a hypoallergenic dog or cat breed. All dogs or cats can cause allergies, even if they don't shed.  Keep these pets out of your home.  If you are not able to keep a pet outdoors, keep the pet out of your bedroom and other sleeping areas at all  times, and keep the door closed.  Remove carpets and furniture covered with cloth from your home. If that is not possible, keep the pet away from fabric-covered furniture and carpets. Dust Mites: Many people with asthma are allergic to dust mites. Dust mites are tiny bugs that are found in every home in mattresses, pillows, carpets, fabric-covered furniture, bedcovers, clothes, stuffed toys, and other  fabric-covered items.   Cover your mattress in a special dust-proof cover.  Cover your pillow in a special dust-proof cover, or wash the pillow each week in hot water. Water must be hotter than 130 F (54.4 C) to kill dust mites. Cold or warm water used with detergent and bleach can also be effective.  Wash the sheets and blankets on your bed each week in hot water.  Try not to sleep or lie on cloth-covered cushions.  Call ahead when traveling and ask for a smoke-free hotel room. Bring your own bedding and pillows in case the hotel only supplies feather pillows and down comforters, which may contain dust mites and cause asthma symptoms.  Remove carpets from your bedroom and those laid on concrete, if you can.  Keep stuffed toys out of the bed, or wash the toys weekly in hot water or cooler water with detergent and bleach. Cockroaches: Many people with asthma are allergic to the droppings and remains of cockroaches.   Keep food and garbage in closed containers. Never leave food out.  Use poison baits, traps, powders, gels, or paste (for example, boric acid).  If a spray is used to kill cockroaches, stay out of the room until the odor goes away. Indoor Mold:  Fix leaky faucets, pipes, or other sources of water that have mold around them.  Clean floors and moldy surfaces with a fungicide or diluted bleach.  Avoid using humidifiers, vaporizers, or swamp coolers. These can spread molds through the air. Pollen and Outdoor Mold:  When pollen or mold spore counts are high, try to keep your windows closed.  Stay indoors with windows closed from late morning to afternoon. Pollen and some mold spore counts are highest at that time.  Ask your health care provider whether you need to take anti-inflammatory medicine or increase your dose of the medicine before your allergy season starts. Other Irritants to Avoid:  Tobacco smoke is an irritant. If you smoke, ask your health care provider how  you can quit. Ask family members to quit smoking, too. Do not allow smoking in your home or car.  If possible, do not use a wood-burning stove, kerosene heater, or fireplace. Minimize exposure to all sources of smoke, including incense, candles, fires, and fireworks.  Try to stay away from strong odors and sprays, such as perfume, talcum powder, hair spray, and paints.  Decrease humidity in your home and use an indoor air cleaning device. Reduce indoor humidity to below 60%. Dehumidifiers or central air conditioners can do this.  Decrease house dust exposure by changing furnace and air cooler filters frequently.  Try to have someone else vacuum for you once or twice a week. Stay out of rooms while they are being vacuumed and for a short while afterward.  If you vacuum, use a dust mask from a hardware store, a double-layered or microfilter vacuum cleaner bag, or a vacuum cleaner with a HEPA filter.  Sulfites in foods and beverages can be irritants. Do not drink beer or wine or eat dried fruit, processed potatoes, or shrimp if they cause asthma symptoms.  Cold  air can trigger an asthma attack. Cover your nose and mouth with a scarf on cold or windy days.  Several health conditions can make asthma more difficult to manage, including a runny nose, sinus infections, reflux disease, psychological stress, and sleep apnea. Work with your health care provider to manage these conditions.  Avoid close contact with people who have a respiratory infection such as a cold or the flu, since your asthma symptoms may get worse if you catch the infection. Wash your hands thoroughly after touching items that may have been handled by people with a respiratory infection.  Get a flu shot every year to protect against the flu virus, which often makes asthma worse for days or weeks. Also get a pneumonia shot if you have not previously had one. Unlike the flu shot, the pneumonia shot does not need to be given  yearly. Medicines:  Talk to your health care provider about whether it is safe for you to take aspirin or non-steroidal anti-inflammatory medicines (NSAIDs). In a small number of people with asthma, aspirin and NSAIDs can cause asthma attacks. These medicines must be avoided by people who have known aspirin-sensitive asthma. It is important that people with aspirin-sensitive asthma read labels of all over-the-counter medicines used to treat pain, colds, coughs, and fever.  Beta-blockers and ACE inhibitors are other medicines you should discuss with your health care provider. HOW CAN I FIND OUT WHAT I AM ALLERGIC TO? Ask your asthma health care provider about allergy skin testing or blood testing (the RAST test) to identify the allergens to which you are sensitive. If you are found to have allergies, the most important thing to do is to try to avoid exposure to any allergens that you are sensitive to as much as possible. Other treatments for allergies, such as medicines and allergy shots (immunotherapy) are available.  CAN I EXERCISE? Follow your health care provider's advice regarding asthma treatment before exercising. It is important to maintain a regular exercise program, but vigorous exercise or exercise in cold, humid, or dry environments can cause asthma attacks, especially for those people who have exercise-induced asthma. Document Released: 07/26/2009 Document Revised: 08/12/2013 Document Reviewed: 02/12/2013 [facility] Patient Information 2015 [place], [place]. This information is not intended to replace advice given to you by your health care provider. Make sure you discuss any questions you have with your health care provider.  Asthma Asthma is a recurring condition in which the airways tighten and narrow. Asthma can make it difficult to breathe. It can cause coughing, wheezing, and shortness of breath. Asthma episodes, also called asthma attacks, range from minor to life-threatening. Asthma  cannot be cured, but medicines and lifestyle changes can help control it. CAUSES Asthma is believed to be caused by inherited (genetic) and environmental factors, but its exact cause is unknown. Asthma may be triggered by allergens, lung infections, or irritants in the air. Asthma triggers are different for each person. Common triggers include:   Animal dander.  Dust mites.  Cockroaches.  Pollen from trees or grass.  Mold.  Smoke.  Air pollutants such as dust, household cleaners, hair sprays, aerosol sprays, paint fumes, strong chemicals, or strong odors.  Cold air, weather changes, and winds (which increase molds and pollens in the air).  Strong emotional expressions such as crying or laughing hard.  Stress.  Certain medicines (such as aspirin) or types of drugs (such as beta-blockers).  Sulfites in foods and drinks. Foods and drinks that may contain sulfites include dried fruit, potato  chips, and sparkling grape juice. °· Infections or inflammatory conditions such as the flu, a cold, or an inflammation of the nasal membranes (rhinitis). °· Gastroesophageal reflux disease (GERD). °· Exercise or strenuous activity. °SYMPTOMS °Symptoms may occur immediately after asthma is triggered or many hours later. Symptoms include: °· Wheezing. °· Excessive nighttime or early morning coughing. °· Frequent or severe coughing with a common cold. °· Chest tightness. °· Shortness of breath. °DIAGNOSIS  °The diagnosis of asthma is made by a review of your medical history and a physical exam. Tests may also be performed. These may include: °· Lung function studies. These tests show how much air you breathe in and out. °· Allergy tests. °· Imaging tests such as X-rays. °TREATMENT  °Asthma cannot be cured, but it can usually be controlled. Treatment involves identifying and avoiding your asthma triggers. It also involves medicines. There are 2 classes of medicine used for asthma treatment:  °· Controller  medicines. These prevent asthma symptoms from occurring. They are usually taken every day. °· Reliever or rescue medicines. These quickly relieve asthma symptoms. They are used as needed and provide short-term relief. °Your health care provider will help you create an asthma action plan. An asthma action plan is a written plan for managing and treating your asthma attacks. It includes a list of your asthma triggers and how they may be avoided. It also includes information on when medicines should be taken and when their dosage should be changed. An action plan may also involve the use of a device called a peak flow meter. A peak flow meter measures how well the lungs are working. It helps you monitor your condition. °HOME CARE INSTRUCTIONS  °· Take medicines only as directed by your health care provider. Speak with your health care provider if you have questions about how or when to take the medicines. °· Use a peak flow meter as directed by your health care provider. Record and keep track of readings. °· Understand and use the action plan to help minimize or stop an asthma attack without needing to seek medical care. °· Control your home environment in the following ways to help prevent asthma attacks: °¨ Do not smoke. Avoid being exposed to secondhand smoke. °¨ Change your heating and air conditioning filter regularly. °¨ Limit your use of fireplaces and wood stoves. °¨ Get rid of pests (such as roaches and mice) and their droppings. °¨ Throw away plants if you see mold on them. °¨ Clean your floors and dust regularly. Use unscented cleaning products. °¨ Try to have someone else vacuum for you regularly. Stay out of rooms while they are being vacuumed and for a short while afterward. If you vacuum, use a dust mask from a hardware store, a double-layered or microfilter vacuum cleaner bag, or a vacuum cleaner with a HEPA filter. °¨ Replace carpet with wood, tile, or vinyl flooring. Carpet can trap dander and  dust. °¨ Use allergy-proof pillows, mattress covers, and box spring covers. °¨ Wash bed sheets and blankets every week in hot water and dry them in a dryer. °¨ Use blankets that are made of polyester or cotton. °¨ Clean bathrooms and kitchens with bleach. If possible, have someone repaint the walls in these rooms with mold-resistant paint. Keep out of the rooms that are being cleaned and painted. °¨ Wash hands frequently. °SEEK MEDICAL CARE IF:  °· You have wheezing, shortness of breath, or a cough even if taking medicine to prevent attacks. °· The colored mucus   you cough up (sputum) is thicker than usual. °· Your sputum changes from clear or white to yellow, green, gray, or bloody. °· You have any problems that may be related to the medicines you are taking (such as a rash, itching, swelling, or trouble breathing). °· You are using a reliever medicine more than 2-3 times per week. °· Your peak flow is still at 50-79% of your personal best after following your action plan for 1 hour. °· You have a fever. °SEEK IMMEDIATE MEDICAL CARE IF:  °· You seem to be getting worse and are unresponsive to treatment during an asthma attack. °· You are short of breath even at rest. °· You get short of breath when doing very little physical activity. °· You have difficulty eating, drinking, or talking due to asthma symptoms. °· You develop chest pain. °· You develop a fast heartbeat. °· You have a bluish color to your lips or fingernails. °· You are light-headed, dizzy, or faint. °· Your peak flow is less than 50% of your personal best. °MAKE SURE YOU:  °· Understand these instructions. °· Will watch your condition. °· Will get help right away if you are not doing well or get worse. °Document Released: 08/07/2005 Document Revised: 12/22/2013 Document Reviewed: 03/06/2013 °[employer]® Patient Information ©2015 [employer]. This information is not intended to replace advice given to you by your health care provider. Make sure you  discuss any questions you have with your health care provider. ° °

## 2014-06-14 NOTE — ED Notes (Signed)
H/o asthma, here for flare up, onset Friday, last flare up 2 weeks ago with steroid and 2 weeks before that with steroid, relates to weather changes and recent sick contacts. C/o sob, wheezing and fever (102) and throat burning, has been taking mucinex (last at 2130), tylenol (0200, ibuprofen (2130), albuterol (0500) and advair (2130). Denies cough or pain.

## 2014-06-22 ENCOUNTER — Ambulatory Visit: Payer: Self-pay | Admitting: Family Medicine

## 2014-06-23 ENCOUNTER — Ambulatory Visit (INDEPENDENT_AMBULATORY_CARE_PROVIDER_SITE_OTHER): Payer: 59 | Admitting: Family Medicine

## 2014-06-23 ENCOUNTER — Encounter: Payer: Self-pay | Admitting: Family Medicine

## 2014-06-23 VITALS — BP 151/84 | HR 97 | Temp 98.6°F | Ht 64.0 in | Wt 226.8 lb

## 2014-06-23 DIAGNOSIS — J45901 Unspecified asthma with (acute) exacerbation: Secondary | ICD-10-CM

## 2014-06-23 MED ORDER — PREDNISONE 50 MG PO TABS: 50.0000 mg | ORAL_TABLET | Freq: Every day | ORAL | Status: DC

## 2014-06-23 MED ORDER — DOXYCYCLINE HYCLATE 100 MG PO TABS: 100.0000 mg | ORAL_TABLET | Freq: Two times a day (BID) | ORAL | Status: DC

## 2014-06-23 NOTE — Patient Instructions (Signed)
Come back to see Dr. Berkley Harvey in 3 weeks, or sooner if not getting better or if you get worse. Prednisone 50mg  daily for 5 days Doxycycline 100mg  twice a day for 7 days  Be well, Dr. Ardelia Mems

## 2014-06-24 NOTE — Progress Notes (Signed)
Patient ID: Donna Carlson, female   DOB: August 28, 1975, 38 y.o.   MRN: [id number]  HPI:  Pt presents for a same day appointment to discuss productive cough and asthma.  About 2 weeks ago patient got sick. She was seen approximately 9 days ago in the emergency room with a productive cough. She was given IV Solu-Medrol for presumed asthma exacerbation. She was also given a steroid taper, but was instructed not to fill this or take it without speaking with her primary doctor first, or unless she absolutely needed it. She has been producing dark green frothy sputum. Denies hemoptysis. When this all initially began she had a fever up to 102, but has not been febrile in the last several days. She feels a little better overall, but is still congested and her cough is still bad. It is not improving at all. She is using Advair twice a day as well as Singulair daily. Has been using albuterol twice a day.   She has been treated twice in the last 2 months with a course of steroids for asthma exacerbations. She states prior to the last few months she did not have frequent exacerbations.  ROS: See HPI  [place]: history of acne, obesity, asthma  PHYSICAL EXAM: BP 151/84 mmHg  Pulse 97  Temp(Src) 98.6 F (37 C) (Oral)  Ht 5\' 4"  (1.626 m)  Wt 226 lb 12.8 oz (102.876 kg)  BMI 38.91 kg/m2  SpO2 97%  LMP 06/19/2014 Gen: NAD, pleasant, cooperative, well appearing overall HEENT: NCAT, MMM, no anterior cervical LAD Heart: RRR, no murmurs Lungs: CTAB, possible decr air movement in bases, mild inspiratory wheezing, otherwise clear Neuro: grossly nonfocal, speech normal  ASSESSMENT/PLAN:  Asthma with exacerbation Exacerbation likely related to viral etiology, but continuation of productive cough of green sputum without any improvement over the last 9 days suggests possible bacterial superinfection. No hypoxia or distress. Plan: -doxycycline 100mg  BID x 7 days -prednisone 50mg  daily x 5 days -f/u if not  improving or if worsening     FOLLOW UP: F/u as needed if symptoms worsen or do not improve.  Also schedule f/u with PCP in 3 weeks to assess asthma control  [place] J. Ardelia Mems, [place]

## 2014-06-24 NOTE — Assessment & Plan Note (Signed)
Exacerbation likely related to viral etiology, but continuation of productive cough of green sputum without any improvement over the last 9 days suggests possible bacterial superinfection. No hypoxia or distress. Plan: -doxycycline 100mg  BID x 7 days -prednisone 50mg  daily x 5 days -f/u if not improving or if worsening

## 2014-06-29 ENCOUNTER — Telehealth: Payer: Self-pay | Admitting: Family Medicine

## 2014-06-29 NOTE — Telephone Encounter (Signed)
Called and LVM informing patient that clinic visit is necessary to review/complete FMLA paperwork.

## 2014-07-02 ENCOUNTER — Encounter: Payer: Self-pay | Admitting: Family Medicine

## 2014-07-02 ENCOUNTER — Ambulatory Visit (INDEPENDENT_AMBULATORY_CARE_PROVIDER_SITE_OTHER): Payer: 59 | Admitting: Family Medicine

## 2014-07-02 VITALS — BP 110/78 | HR 99 | Temp 97.8°F | Ht 64.0 in | Wt 224.0 lb

## 2014-07-02 DIAGNOSIS — J45901 Unspecified asthma with (acute) exacerbation: Secondary | ICD-10-CM

## 2014-07-02 DIAGNOSIS — E669 Obesity, unspecified: Secondary | ICD-10-CM

## 2014-07-04 NOTE — Progress Notes (Signed)
  Patient name: Donna Carlson MRN [id number]  Date of birth: 03-Jan-1976  CC & HPI:  Donna Carlson is a 38 y.o. female presenting today for asthma check-up and FMLA paperwork. She reports recent asthma exacerbation which is resolved. She continues to have 2-4 exacerbations a year associated with viral illnesses and weather changes. She reports her symptoms are well controlled with her currently medications the rest of the year. Denies any hospitalization or intubation in the past year.   Medications & Allergies: Reviewed  Social History: Reviewed:   Objective Findings:  Vitals: BP 110/78 mmHg  Pulse 99  Temp(Src) 97.8 F (36.6 C) (Oral)  Ht 5\' 4"  (1.626 m)  Wt 224 lb (101.606 kg)  BMI 38.43 kg/m2  SpO2 97%  LMP 06/19/2014  Gen: NAD CV: RRR w/o m/r/g, pulses +2 b/l Resp: CTAB w/ normal respiratory effort  Assessment & Plan:   Please See Problem Focused Assessment & Plan

## 2014-07-04 NOTE — Assessment & Plan Note (Signed)
Improved today from resent exacerbation - FMLA paperwork completed - Continue current medical therapy

## 2015-02-28 ENCOUNTER — Encounter (HOSPITAL_COMMUNITY): Payer: Self-pay | Admitting: Emergency Medicine

## 2015-02-28 ENCOUNTER — Emergency Department (HOSPITAL_COMMUNITY)
Admission: EM | Admit: 2015-02-28 | Discharge: 2015-02-28 | Disposition: A | Discharge status: Home / Self Care | Payer: 59 | Source: Home / Self Care | Attending: Emergency Medicine | Admitting: Emergency Medicine

## 2015-02-28 DIAGNOSIS — J40 Bronchitis, not specified as acute or chronic: Secondary | ICD-10-CM

## 2015-02-28 DIAGNOSIS — H109 Unspecified conjunctivitis: Secondary | ICD-10-CM | POA: Diagnosis not present

## 2015-02-28 MED ORDER — POLYMYXIN B-TRIMETHOPRIM 10000-0.1 UNIT/ML-% OP SOLN: 1.0000 [drp] | OPHTHALMIC | Status: DC

## 2015-02-28 MED ORDER — PREDNISONE 50 MG PO TABS: 50.0000 mg | ORAL_TABLET | Freq: Every day | ORAL | Status: DC

## 2015-02-28 NOTE — Discharge Instructions (Signed)
Bacterial Conjunctivitis Bacterial conjunctivitis (commonly called pink eye) is redness, soreness, or puffiness (inflammation) of the white part of your eye. It is caused by a germ called bacteria. These germs can easily spread from person to person (contagious). Your eye often will become red or pink. Your eye may also become irritated, watery, or have a thick discharge.  HOME CARE   Apply a cool, clean washcloth over closed eyelids. Do this for 10-20 minutes, 3-4 times a day while you have pain.  Gently wipe away any fluid coming from the eye with a warm, wet washcloth or cotton ball.  Wash your hands often with soap and water. Use paper towels to dry your hands.  Do not share towels or washcloths.  Change or wash your pillowcase every day.  Do not use eye makeup until the infection is gone.  Do not use machines or drive if your vision is blurry.  Stop using contact lenses. Do not use them again until your doctor says it is okay.  Do not touch the tip of the eye drop bottle or medicine tube with your fingers when you put medicine on the eye. GET HELP RIGHT AWAY IF:   Your eye is not better after 3 days of starting your medicine.  You have a yellowish fluid coming out of the eye.  You have more pain in the eye.  Your eye redness is spreading.  Your vision becomes blurry.  You have a fever or lasting symptoms for more than 2-3 days.  You have a fever and your symptoms suddenly get worse.  You have pain in the face.  Your face gets red or puffy (swollen). MAKE SURE YOU:   Understand these instructions.  Will watch this condition.  Will get help right away if you are not doing well or get worse. Document Released: 05/16/2008 Document Revised: 07/24/2012 Document Reviewed: 04/12/2012 [facility] Patient Information 2015 [place], [place]. This information is not intended to replace advice given to you by your health care provider. Make sure you discuss any questions you have  with your health care provider.  Your cough and sputum is likely due to a flare of your asthma. Take prednisone daily for 5 days.

## 2015-02-28 NOTE — ED Provider Notes (Signed)
CSN: [id number]     Arrival date & time 02/28/15  1323 History   First MD Initiated Contact with Patient 02/28/15 1423     Chief Complaint  Patient presents with  . Conjunctivitis   (Consider location/radiation/quality/duration/timing/severity/associated sxs/prior Treatment) HPI She is a 39 year old woman here for evaluation of right eye redness. She states this started last night. She states by problem itches. She denies any change in her vision. She has had some drainage and crusting of the eye. She had to use a warm compress this morning to remove the crusting. No nasal congestion or rhinorrhea. No ear pain. She does report about a one-week history of intermittent productive cough. He has been using her asthma medications as prescribed. No wheezing or shortness of breath. No fevers.  Past Medical History  Diagnosis Date  . Palpitations   . Asthma    Past Surgical History  Procedure Laterality Date  . Gallbladder surgery    . Tubal ligation     Family History  Problem Relation Age of Onset  . Hypertension Mother   . Hypertension Maternal Grandmother   . Heart attack Paternal Grandfather    History  Substance Use Topics  . Smoking status: Never Smoker   . Smokeless tobacco: Not on file  . Alcohol Use: Yes   OB History    No data available     Review of Systems As in history of present illness Allergies  Shellfish allergy and Iodine  Home Medications   Prior to Admission medications   Medication Sig Start Date End Date Taking? Authorizing Provider  acetaminophen (TYLENOL) 500 MG tablet Take 500 mg by mouth every 6 (six) hours as needed for mild pain.    Historical Provider, MD  albuterol (PROVENTIL HFA;VENTOLIN HFA) 108 (90 BASE) MCG/ACT inhaler Inhale 2 puffs into the lungs 2 (two) times daily as needed for wheezing or shortness of breath. 05/25/14   Lupita Dawn, MD  albuterol (PROVENTIL) (2.5 MG/3ML) 0.083% nebulizer solution Take 3 mLs (2.5 mg total) by  nebulization every 4 (four) hours as needed for wheezing or shortness of breath. 01/01/14   Kassie Mends, MD  Chlorphen-Pseudoephed-APAP (THERAFLU FLU/COLD PO) Take 1 packet by mouth as needed (cold/fever).    Historical Provider, MD  doxycycline (VIBRA-TABS) 100 MG tablet Take 1 tablet (100 mg total) by mouth 2 (two) times daily. 06/23/14   Leeanne Rio, MD  fluticasone (FLONASE) 50 MCG/ACT nasal spray Place 2 sprays into both nostrils daily. 04/24/14   Willeen Niece, MD  Fluticasone-Salmeterol (ADVAIR) 500-50 MCG/DOSE AEPB Inhale 1 puff into the lungs 2 (two) times daily. 01/01/14   Kassie Mends, MD  ibuprofen (ADVIL,MOTRIN) 200 MG tablet Take 400 mg by mouth 2 (two) times daily as needed for pain.    Historical Provider, MD  montelukast (SINGULAIR) 10 MG tablet Take 1 tablet (10 mg total) by mouth at bedtime. 05/25/14   Lupita Dawn, MD  predniSONE (DELTASONE) 50 MG tablet Take 1 tablet (50 mg total) by mouth daily with breakfast. 02/28/15   Melony Overly, MD  trimethoprim-polymyxin b (POLYTRIM) ophthalmic solution Place 1 drop into the right eye every 4 (four) hours. For 7 days 02/28/15   Melony Overly, MD   BP 124/88 mmHg  Pulse 76  Temp(Src) 99 F (37.2 C) (Oral)  Resp 16  SpO2 96%  LMP 02/22/2015 Physical Exam  Constitutional: She is oriented to person, place, and time. She appears well-developed and well-nourished.  HENT:  Mouth/Throat: No oropharyngeal exudate.  Oropharynx is slightly erythematous and irritated looking.  Eyes: Pupils are equal, round, and reactive to light.  Right conjunctiva is injected. She has some yellow drainage from the right eye.  Neck: Neck supple.  Cardiovascular: Normal rate.   Pulmonary/Chest: Effort normal and breath sounds normal. No respiratory distress. She has no wheezes. She has no rales.  Neurological: She is alert and oriented to person, place, and time.    ED Course  Procedures (including critical care time) Labs Review Labs Reviewed - No  data to display  Imaging Review No results found.   MDM   1. Conjunctivitis of right eye   2. Bronchitis    Treat likely bacterial conjunctivitis with Polytrim eyedrops. I suspect her cough and sputum is due to mild flare of her asthma. We'll treat with 5 days of prednisone. Follow-up as needed.    Melony Overly, MD 02/28/15 [phone]

## 2015-02-28 NOTE — ED Notes (Signed)
C/o right pink eye States eye is red, itches, throbbing, has clear discharge and matted eye Does have little ear pain Used warm cloth used as tx

## 2015-03-05 ENCOUNTER — Ambulatory Visit (INDEPENDENT_AMBULATORY_CARE_PROVIDER_SITE_OTHER): Payer: 59 | Admitting: Family Medicine

## 2015-03-05 VITALS — BP 133/80 | HR 69 | Temp 98.4°F | Ht 64.0 in | Wt 216.1 lb

## 2015-03-05 DIAGNOSIS — J45998 Other asthma: Secondary | ICD-10-CM

## 2015-03-05 DIAGNOSIS — H10023 Other mucopurulent conjunctivitis, bilateral: Secondary | ICD-10-CM | POA: Diagnosis not present

## 2015-03-05 DIAGNOSIS — IMO0002 Reserved for concepts with insufficient information to code with codable children: Secondary | ICD-10-CM

## 2015-03-05 DIAGNOSIS — H1033 Unspecified acute conjunctivitis, bilateral: Secondary | ICD-10-CM | POA: Insufficient documentation

## 2015-03-05 MED ORDER — MONTELUKAST SODIUM 10 MG PO TABS: 10.0000 mg | ORAL_TABLET | Freq: Every day | ORAL | Status: DC

## 2015-03-05 MED ORDER — LORATADINE 10 MG PO TABS: 10.0000 mg | ORAL_TABLET | Freq: Every day | ORAL | Status: DC

## 2015-03-05 NOTE — Assessment & Plan Note (Signed)
On advair, singulair, albuterol MDI and nebs prn exacerbations in the summer consistent with atopic picture. Will refill singulair and Rx therapeutic trial of newer antihistamine. She is also on day 3/5 oral steroid burst and will call if symptoms do not continue to improve.

## 2015-03-05 NOTE — Patient Instructions (Signed)
continue the eye drops in both eyes until both have no symptoms. Call if symptoms worsen. I've refilled your singulair and prescribed an antihistamine that I think will help with your seasonal symptoms.

## 2015-03-05 NOTE — Assessment & Plan Note (Signed)
R first, now L. R was treated well with polytrim eye drops Rx at [facility] recently. Not a contact lens wearer. Urged her to continue using these and to add treatment to left eye. Also emphasized that hand washing is key.

## 2015-03-05 NOTE — Progress Notes (Signed)
Subjective: Donna Carlson is a 39 y.o. female [employer] employee presenting for pink eye.   She reports yellow eye discharge from a red left eye for the past 2 days after identical preceding symptoms in the right eye which was evaluated at [facility] and treated with polytrim eye drops. The right eye has cleared up. She also has early morning crusting, but denies fevers, eye pain or vision changes.   Also is in the midst of a minor asthma exacerbation taking oral steroids with some improvement, symptoms mostly occur when outside including sneezing, wheezing, and some dyspnea that resolves with albuterol. She is taking advair and singulair as well as about 2 inhalations of albuterol daily for the past week. She needs a refill on singulair.   - Non smoker  Objective: BP 133/80 mmHg  Pulse 69  Temp(Src) 98.4 F (36.9 C) (Oral)  Ht 5\' 4"  (1.626 m)  Wt 216 lb 1.6 oz (98.022 kg)  BMI 37.08 kg/m2  LMP 02/22/2015 Gen: Well-appearing 39 y.o. female in no distress HEENT: Normocephalic, erythematous L conjunctiva with very slight erythema in R eye - both with peri limbal sparing and left eye tearing. PERL, EOMI without pain. Nares and mouth normal. Oropharynx erythematous without tonsil enlargement or exudates.  Neck: No lymphadenopathy Pulm: Non-labored breathing ambient air; CTAB, no wheezes or crackles.  Assessment/Plan: Donna Carlson is a 39 y.o. female here for bacterial conjunctivitis  See problem list for plan.

## 2015-03-22 ENCOUNTER — Telehealth: Payer: Self-pay | Admitting: Family Medicine

## 2015-03-22 NOTE — Telephone Encounter (Signed)
Attempt to call patient to advised her to make an appointment for asthma follow-up and complete FMLA paperwork, but unable to reach.

## 2015-03-24 NOTE — Telephone Encounter (Signed)
LM ok per DPR in chart for patient to call office back to schedule an appt. Jazmin Hartsell,CMA

## 2015-03-30 ENCOUNTER — Encounter: Payer: Self-pay | Admitting: Family Medicine

## 2015-03-30 ENCOUNTER — Ambulatory Visit (INDEPENDENT_AMBULATORY_CARE_PROVIDER_SITE_OTHER): Payer: 59 | Admitting: Family Medicine

## 2015-03-30 VITALS — BP 121/74 | HR 87 | Temp 98.4°F | Ht 64.0 in | Wt 222.0 lb

## 2015-03-30 DIAGNOSIS — IMO0002 Reserved for concepts with insufficient information to code with codable children: Secondary | ICD-10-CM

## 2015-03-30 DIAGNOSIS — J45998 Other asthma: Secondary | ICD-10-CM | POA: Diagnosis not present

## 2015-03-30 NOTE — Progress Notes (Signed)
  Patient name: Donna Carlson MRN [id number]  Date of birth: 04-27-1976  CC & HPI:  Donna Carlson is a 39 y.o. female presenting today for asthma follow-up.  She reports her asthma is currently well controlled.  She has 5-6 exacerbations yearly, only a few of which require her to miss work.  Exacerbation is usually last 2-3 days, and she treats them at home with increased albuterol.  She has noticed improvement of her asthma after starting Claritin and no longer uses Flonase.  Denies any hospitalizations or ED visits the past year.  During her asthma exacerbations, she is unable to perform her duties at work and request FMLA paperwork to be completely today.   ROS: See HPI   Medical & Surgical Hx:  Reviewed  Medications & Allergies: Reviewed  Social History: Reviewed:   Objective Findings:  Vitals: BP 121/74 mmHg  Pulse 87  Temp(Src) 98.4 F (36.9 C) (Oral)  Ht 5\' 4"  (1.626 m)  Wt 222 lb (100.699 kg)  BMI 38.09 kg/m2  LMP 02/22/2015  Gen: NAD; obese CV: RRR w/o m/r/g, pulses +2 b/l Resp: CTAB w/ normal respiratory effort  Assessment & Plan:   Please See Problem Focused Assessment & Plan

## 2015-03-30 NOTE — Assessment & Plan Note (Signed)
Asthma well-controlled with several mild/moderate exacerbations yearly.  No ED visits or hospitalizations last year - Continue Advair, Singulair, Claritin, albuterol when necessary - FMLA paperwork completed - follow-up in 6 months or sooner if needed - discussed referral to pulmonology or asthma/allergy specialist but she declines at this time - consider PFT in future for reassessment +/- Sleep study due to obesity and snoring

## 2015-05-13 ENCOUNTER — Ambulatory Visit (INDEPENDENT_AMBULATORY_CARE_PROVIDER_SITE_OTHER): Payer: 59 | Admitting: Family Medicine

## 2015-05-13 ENCOUNTER — Encounter: Payer: Self-pay | Admitting: Family Medicine

## 2015-05-13 DIAGNOSIS — J4541 Moderate persistent asthma with (acute) exacerbation: Secondary | ICD-10-CM

## 2015-05-13 MED ORDER — PREDNISONE 20 MG PO TABS: 20.0000 mg | ORAL_TABLET | Freq: Every day | ORAL | Status: DC

## 2015-05-13 NOTE — Patient Instructions (Signed)
I sent in your prescription for prednisone.  I do not think you need antibiotics.   Please make an appointment with Dr. Berkley Harvey soon.  You are due for a flu shot, a pap smear, tetanus shot and cholesterol check by our records. Mention to him that I thought your thyroid was a little big. I will admit that I might be wrong.   Call us back if you get worse.

## 2015-05-14 NOTE — Assessment & Plan Note (Signed)
By exam, the exacerbation is not that bad.  But she is experienced and her symptoms have mainly been at night.  So, I agree with the course of steroids.  No reason for antibiotics, no evidence of bacterial infection.

## 2015-05-14 NOTE — Progress Notes (Signed)
   Subjective:    Patient ID: Donna Carlson, female    DOB: [birth year], 39 y.o.   MRN: [id number]  HPI Patient with known history of chronic asthma (moderate persistant?) presents with 1 week hx of URI symptoms (nasal congestion, sore throat) and two nights of worsening shortness of breath, cough and wheezing.  Using nebulizer frequently. No fever or infectious exposure.   "I think I need a course of steroids."  Overdue health maint.  Will get flu shot and maybe tetanus through work [employer] employee.)  Also needs pap  No hx of thyroid problems.    Review of Systems     Objective:   Physical Exam TMs normal Throat mild cobblestone. Neck No sig nodes By my feel, she has a generous thyroid gland. Lungs clear.  No rales, rhonchi or wheeze.  Mildly prolonged exp phase.       Assessment & Plan:

## 2015-06-04 ENCOUNTER — Ambulatory Visit (HOSPITAL_COMMUNITY)
Admission: RE | Admit: 2015-06-04 | Discharge: 2015-06-04 | Disposition: A | Discharge status: Home / Self Care | Payer: 59 | Source: Ambulatory Visit | Attending: Family Medicine | Admitting: Family Medicine

## 2015-06-04 ENCOUNTER — Telehealth: Payer: Self-pay | Admitting: Family Medicine

## 2015-06-04 ENCOUNTER — Ambulatory Visit
Admission: RE | Admit: 2015-06-04 | Discharge: 2015-06-04 | Disposition: A | Discharge status: Home / Self Care | Payer: 59 | Source: Ambulatory Visit | Attending: Family Medicine | Admitting: Family Medicine

## 2015-06-04 ENCOUNTER — Encounter: Payer: Self-pay | Admitting: Family Medicine

## 2015-06-04 ENCOUNTER — Ambulatory Visit (INDEPENDENT_AMBULATORY_CARE_PROVIDER_SITE_OTHER): Payer: 59 | Admitting: Family Medicine

## 2015-06-04 VITALS — BP 134/97 | HR 77 | Temp 98.5°F | Ht 64.0 in | Wt 221.0 lb

## 2015-06-04 DIAGNOSIS — IMO0002 Reserved for concepts with insufficient information to code with codable children: Secondary | ICD-10-CM

## 2015-06-04 DIAGNOSIS — J45998 Other asthma: Secondary | ICD-10-CM | POA: Diagnosis not present

## 2015-06-04 DIAGNOSIS — J309 Allergic rhinitis, unspecified: Secondary | ICD-10-CM | POA: Diagnosis not present

## 2015-06-04 DIAGNOSIS — R0789 Other chest pain: Secondary | ICD-10-CM | POA: Diagnosis not present

## 2015-06-04 DIAGNOSIS — R059 Cough, unspecified: Secondary | ICD-10-CM

## 2015-06-04 DIAGNOSIS — R05 Cough: Secondary | ICD-10-CM | POA: Diagnosis not present

## 2015-06-04 MED ORDER — FLUTICASONE PROPIONATE 50 MCG/ACT NA SUSP: 2.0000 | Freq: Every day | NASAL | Status: DC

## 2015-06-04 MED ORDER — LORATADINE 10 MG PO TABS: 10.0000 mg | ORAL_TABLET | Freq: Every day | ORAL | Status: DC

## 2015-06-04 MED ORDER — ALBUTEROL SULFATE (2.5 MG/3ML) 0.083% IN NEBU
2.5000 mg | INHALATION_SOLUTION | Freq: Once | RESPIRATORY_TRACT | Status: AC
  Administered 2015-06-04: 2.5 mg via RESPIRATORY_TRACT

## 2015-06-04 NOTE — Patient Instructions (Signed)
Nice to meet you today.  I think your cough is likely related to cold or allergies.  Go get an XRay to be sure your lungs look ok.  Start using flonase and claritin.  We will let you know if your lungs look ok.  Take care, Dr. B  Asthma, Adult Asthma is a recurring condition in which the airways tighten and narrow. Asthma can make it difficult to breathe. It can cause coughing, wheezing, and shortness of breath. Asthma episodes, also called asthma attacks, range from minor to life-threatening. Asthma cannot be cured, but medicines and lifestyle changes can help control it. CAUSES Asthma is believed to be caused by inherited (genetic) and environmental factors, but its exact cause is unknown. Asthma may be triggered by allergens, lung infections, or irritants in the air. Asthma triggers are different for each person. Common triggers include:   Animal dander.  Dust mites.  Cockroaches.  Pollen from trees or grass.  Mold.  Smoke.  Air pollutants such as dust, household cleaners, hair sprays, aerosol sprays, paint fumes, strong chemicals, or strong odors.  Cold air, weather changes, and winds (which increase molds and pollens in the air).  Strong emotional expressions such as crying or laughing hard.  Stress.  Certain medicines (such as aspirin) or types of drugs (such as beta-blockers).  Sulfites in foods and drinks. Foods and drinks that may contain sulfites include dried fruit, potato chips, and sparkling grape juice.  Infections or inflammatory conditions such as the flu, a cold, or an inflammation of the nasal membranes (rhinitis).  Gastroesophageal reflux disease (GERD).  Exercise or strenuous activity. SYMPTOMS Symptoms may occur immediately after asthma is triggered or many hours later. Symptoms include:  Wheezing.  Excessive nighttime or early morning coughing.  Frequent or severe coughing with a common cold.  Chest tightness.  Shortness of breath. DIAGNOSIS    The diagnosis of asthma is made by a review of your medical history and a physical exam. Tests may also be performed. These may include:  Lung function studies. These tests show how much air you breathe in and out.  Allergy tests.  Imaging tests such as X-rays. TREATMENT  Asthma cannot be cured, but it can usually be controlled. Treatment involves identifying and avoiding your asthma triggers. It also involves medicines. There are 2 classes of medicine used for asthma treatment:   Controller medicines. These prevent asthma symptoms from occurring. They are usually taken every day.  Reliever or rescue medicines. These quickly relieve asthma symptoms. They are used as needed and provide short-term relief. Your health care provider will help you create an asthma action plan. An asthma action plan is a written plan for managing and treating your asthma attacks. It includes a list of your asthma triggers and how they may be avoided. It also includes information on when medicines should be taken and when their dosage should be changed. An action plan may also involve the use of a device called a peak flow meter. A peak flow meter measures how well the lungs are working. It helps you monitor your condition. HOME CARE INSTRUCTIONS   Take medicines only as directed by your health care provider. Speak with your health care provider if you have questions about how or when to take the medicines.  Use a peak flow meter as directed by your health care provider. Record and keep track of readings.  Understand and use the action plan to help minimize or stop an asthma attack without needing to  seek medical care.  Control your home environment in the following ways to help prevent asthma attacks:  Do not smoke. Avoid being exposed to secondhand smoke.  Change your heating and air conditioning filter regularly.  Limit your use of fireplaces and wood stoves.  Get rid of pests (such as roaches and mice)  and their droppings.  Throw away plants if you see mold on them.  Clean your floors and dust regularly. Use unscented cleaning products.  Try to have someone else vacuum for you regularly. Stay out of rooms while they are being vacuumed and for a short while afterward. If you vacuum, use a dust mask from a hardware store, a double-layered or microfilter vacuum cleaner bag, or a vacuum cleaner with a HEPA filter.  Replace carpet with wood, tile, or vinyl flooring. Carpet can trap dander and dust.  Use allergy-proof pillows, mattress covers, and box spring covers.  Wash bed sheets and blankets every week in hot water and dry them in a dryer.  Use blankets that are made of polyester or cotton.  Clean bathrooms and kitchens with bleach. If possible, have someone repaint the walls in these rooms with mold-resistant paint. Keep out of the rooms that are being cleaned and painted.  Wash hands frequently. SEEK MEDICAL CARE IF:   You have wheezing, shortness of breath, or a cough even if taking medicine to prevent attacks.  The colored mucus you cough up (sputum) is thicker than usual.  Your sputum changes from clear or white to yellow, green, gray, or bloody.  You have any problems that may be related to the medicines you are taking (such as a rash, itching, swelling, or trouble breathing).  You are using a reliever medicine more than 2-3 times per week.  Your peak flow is still at 50-79% of your personal best after following your action plan for 1 hour.  You have a fever. SEEK IMMEDIATE MEDICAL CARE IF:   You seem to be getting worse and are unresponsive to treatment during an asthma attack.  You are short of breath even at rest.  You get short of breath when doing very little physical activity.  You have difficulty eating, drinking, or talking due to asthma symptoms.  You develop chest pain.  You develop a fast heartbeat.  You have a bluish color to your lips or  fingernails.  You are light-headed, dizzy, or faint.  Your peak flow is less than 50% of your personal best.   This information is not intended to replace advice given to you by your health care provider. Make sure you discuss any questions you have with your health care provider.   Document Released: 08/07/2005 Document Revised: 04/28/2015 Document Reviewed: 03/06/2013 Elsevier Interactive Patient Education [employer].

## 2015-06-04 NOTE — Progress Notes (Signed)
Patient ID: Donna Carlson, female   DOB: May 07, 1976, 39 y.o.   MRN: [id number]  HPI: This history was provided by the patient.  Patient presents with shortness of breath x2 days.  Patient has a history of asthma and has used her Albuterol inhaler and nebulizer at home with no relief.  Patient also states her chest feels tight.  Shortness of breath also accompanied by cough productive of yellow sputum, nasal congestion and rhinorrhea and sinus pain.  Patient also states 3 days ago she experienced one 10-15 minute episode of chest pain that radiated to left arm.  Patient states she was standing at work when the pain began and it subsided after she sat down and rested for 5 minutes or so. Patient has made an appointment with her cardiologist regarding that episode for 10/21.  Patient denies current chest pain, abdominal pain, swelling in legs or feet, N/V/D and fever.  Patient also denies blood in stool and increased SOB with exertion.  Patient states her asthma is generally well controlled, but she often experiences exacerbations with the change of seasons from [place] and Summer/Fall.    ROS: See HPI.  [name]: Asthma, palpitations  PHYSICAL EXAM: BP 134/97 mmHg  Pulse 77  Temp(Src) 98.5 F (36.9 C) (Oral)  Ht 5\' 4"  (1.626 m)  Wt 221 lb (100.245 kg)  BMI 37.92 kg/m2  SpO2 99%  LMP 04/16/2015  Physical Examination:  General: Awake, alert, well nourished, NAD HEENT: Normal    Neck: No masses palpated. No LAD    Ears: TMs intact, normal light reflex, no erythema, no bulging, no otorrhea    Eyes: Sclera white, not injection, no drainage noted    Nose/Sinuses: Nasal turbinates moist, rhinorrhea noted bilaterally, pain with maxillary sinus palpation, no frontal sinus pain with palpation   Throat: MMM, no erythema Cardio: RRR, S1S2 heard, no murmurs appreciated, +2 radial and dorsalis pedis pulses palpated, no LE edema Pulm: End expiratory wheezing noted in all lobes, no rhonchi or  rales MSK: Normal gait and station Skin: dry, intact, no rashes or lesions Neuro: Strength and sensation grossly intact, EOMI, PERRL 42mm  ASSESSMENT/PLAN:  Health maintenance:  - Influenza vaccine due   1. Chest tightness - EKG 12-Lead  2. Cough - DG Chest 2 View; Future  3. Asthma, persistent - loratadine (CLARITIN) 10 MG tablet; Take 1 tablet (10 mg total) by mouth daily.  Dispense: 30 tablet; Refill: 6 - Albuterol nebulizer treatment given in clinic with some relief  4. Allergic rhinitis, unspecified allergic rhinitis type - loratadine (CLARITIN) 10 MG tablet; Take 1 tablet (10 mg total) by mouth daily.  Dispense: 30 tablet; Refill: 6 - fluticasone (FLONASE) 50 MCG/ACT nasal spray; Place 2 sprays into both nostrils daily.  Dispense: 16 g; Refill: 6  FOLLOW UP: -Follow-up with clinic or ED immediately for return of chest pain or palpitations, shortness of breath not managed with Albuterol and dizziness.   -Follow-up in clinic within 2 weeks if upper respiratory symptoms are not improved following consistent use of Claritin and Flonase. -Follow-up with Dr. Burt Knack as scheduled 10/21  Sherron Ales, NP [place]

## 2015-06-04 NOTE — Telephone Encounter (Signed)
Called patient to relay that CXR clear.  No pneumonia or other issues.  No need for antibiotics.  No answer, left VM and asked her to call clinic for results.  Please relay this message if she calls back.  Virginia Crews, MD, MPH PGY-2,  [facility] 06/04/2015 7:15 PM

## 2015-06-07 NOTE — Telephone Encounter (Signed)
Sinuses likely feel like they are draining because of flonase.  This is a good effect as her symptoms are likely related to allergies.  She should be seen in clinic if she develops fevers.  Unable to call as currently in ICU.  Let me know if you want me to call her back this evening or if you can relay message. Thanks!  Virginia Crews, MD, MPH PGY-2,  [facility] 06/07/2015 10:51 AM

## 2015-06-07 NOTE — Telephone Encounter (Signed)
Patient is aware of results.  States that she developed dark, thick green mucus over the weekend.  She feels that her sinuses are also draining.  Asked that I check with provider on whether or not she will need to be seen in clinic again for this.  Please advise.  Keiondre Colee,CMA

## 2015-06-07 NOTE — Telephone Encounter (Signed)
Message given to patient and she voiced understanding.  Advised her to call back before the weekend if she isn't feeling better or fever develops so she can be evaluated in clinic. Juquan Reznick,CMA

## 2015-06-11 ENCOUNTER — Ambulatory Visit: Payer: Self-pay | Admitting: Cardiovascular Disease

## 2015-06-11 ENCOUNTER — Ambulatory Visit (INDEPENDENT_AMBULATORY_CARE_PROVIDER_SITE_OTHER): Payer: 59 | Admitting: Family Medicine

## 2015-06-11 VITALS — BP 132/86 | HR 82 | Temp 98.5°F | Ht 62.0 in | Wt 220.9 lb

## 2015-06-11 DIAGNOSIS — B9689 Other specified bacterial agents as the cause of diseases classified elsewhere: Secondary | ICD-10-CM

## 2015-06-11 DIAGNOSIS — A499 Bacterial infection, unspecified: Secondary | ICD-10-CM

## 2015-06-11 DIAGNOSIS — J329 Chronic sinusitis, unspecified: Secondary | ICD-10-CM

## 2015-06-11 MED ORDER — AMOXICILLIN-POT CLAVULANATE 875-125 MG PO TABS
1.0000 | ORAL_TABLET | Freq: Two times a day (BID) | ORAL | Status: DC
Start: 2015-06-11 — End: 2015-10-04

## 2015-06-11 NOTE — Patient Instructions (Signed)
It was a pleasure seeing you today in our clinic. Today we discussed your upper respiratory infection. Here is the treatment plan we have discussed and agreed upon together:   - At this time I believe that it is appropriate to initiate antibiotic therapy due to the longevity of your symptoms and the progressive nature these symptoms are exhibiting. - The antibiotic I have prescribed to you should be taken twice a day over the next 7 days. - Taking yogurt or eating cottage cheese can help prevent any side effects of diarrhea or yeast infections. - If your symptoms get progressively worse or persist beyond the next 7 days come back and see [place] or report to the nearest emergency department.

## 2015-06-11 NOTE — Progress Notes (Signed)
COUGH Patient been seen recently in clinic by 2 other providers. She has had no improvement in symptoms since that time. No recent antibiotic use.  Has been coughing for 7 days. Cough is: productive Sputum production: yes; green/yellow Medications tried: claritin, flonase Taking blood pressure medications: no  Symptoms Runny nose: no Mucous in back of throat: yes Throat burning or reflux: no Wheezing or asthma: yes Fever: no; but some night sweats. Chest Pain: some, no recent changes Shortness of breath: yes Leg swelling: no Hemoptysis: no Weight loss: no  ROS see HPI Smoking Status noted  Objective: BP 132/86 mmHg  Pulse 82  Temp(Src) 98.5 F (36.9 C) (Oral)  Ht 5\' 2"  (1.575 m)  Wt 220 lb 14.4 oz (100.2 kg)  BMI 40.39 kg/m2  LMP 05/22/2015 (Exact Date) Gen: NAD, alert, cooperative, and pleasant. HEENT: NCAT, EOMI, PERRL, OP erythematous no exudate. Nasal mucosa edematous. No LAD  CV: RRR, no murmur Resp: CTAB, no wheezes, non-labored Neuro: Alert and oriented, Speech clear, No gross deficits  Assessment and plan:  Sinusitis, bacterial Patient signs and symptoms most consistent with sinusitis. Patient has been treated conservatively up to this point without the use of antibiotics. Symptoms have been stable or progressively worsening. Sputum production and productive cough yielding yellow/green thick sputum. At this time I think is appropriate to treat with antibiotics. Chest x-ray and physical exam was clear >> no concern for pneumonia at this time. - Augmentin twice a day 7 days. - Follow-up as needed    Meds ordered this encounter  Medications  . amoxicillin-clavulanate (AUGMENTIN) 875-125 MG tablet    Sig: Take 1 tablet by mouth 2 (two) times daily.    Dispense:  14 tablet    Refill:  0     Elberta Leatherwood, MD,MS,  PGY2 06/12/2015 1:04 PM

## 2015-06-12 DIAGNOSIS — J329 Chronic sinusitis, unspecified: Principal | ICD-10-CM

## 2015-06-12 DIAGNOSIS — B9689 Other specified bacterial agents as the cause of diseases classified elsewhere: Secondary | ICD-10-CM | POA: Insufficient documentation

## 2015-06-12 NOTE — Assessment & Plan Note (Signed)
Patient signs and symptoms most consistent with sinusitis. Patient has been treated conservatively up to this point without the use of antibiotics. Symptoms have been stable or progressively worsening. Sputum production and productive cough yielding yellow/green thick sputum. At this time I think is appropriate to treat with antibiotics. Chest x-ray and physical exam was clear >> no concern for pneumonia at this time. - Augmentin twice a day 7 days. - Follow-up as needed

## 2015-08-25 ENCOUNTER — Other Ambulatory Visit: Payer: Self-pay | Admitting: *Deleted

## 2015-08-25 DIAGNOSIS — IMO0002 Reserved for concepts with insufficient information to code with codable children: Secondary | ICD-10-CM

## 2015-08-25 DIAGNOSIS — J069 Acute upper respiratory infection, unspecified: Secondary | ICD-10-CM

## 2015-08-25 MED ORDER — FLUTICASONE-SALMETEROL 500-50 MCG/DOSE IN AEPB: 1.0000 | INHALATION_SPRAY | Freq: Two times a day (BID) | RESPIRATORY_TRACT | Status: DC

## 2015-08-26 MED FILL — ADVAIR 500/50 DISKUS: 500-50 | 30 days supply | Qty: 60 | Fill #0

## 2015-09-02 ENCOUNTER — Other Ambulatory Visit (HOSPITAL_COMMUNITY)
Admission: RE | Admit: 2015-09-02 | Discharge: 2015-09-02 | Disposition: A | Discharge status: Home / Self Care | Payer: 59 | Source: Ambulatory Visit | Attending: Family Medicine | Admitting: Family Medicine

## 2015-09-02 ENCOUNTER — Ambulatory Visit (INDEPENDENT_AMBULATORY_CARE_PROVIDER_SITE_OTHER): Payer: 59 | Admitting: Family Medicine

## 2015-09-02 ENCOUNTER — Encounter: Payer: Self-pay | Admitting: Family Medicine

## 2015-09-02 VITALS — BP 125/79 | HR 77 | Temp 98.3°F | Wt 220.0 lb

## 2015-09-02 DIAGNOSIS — N898 Other specified noninflammatory disorders of vagina: Secondary | ICD-10-CM | POA: Diagnosis not present

## 2015-09-02 DIAGNOSIS — Z113 Encounter for screening for infections with a predominantly sexual mode of transmission: Secondary | ICD-10-CM | POA: Insufficient documentation

## 2015-09-02 DIAGNOSIS — A599 Trichomoniasis, unspecified: Secondary | ICD-10-CM

## 2015-09-02 DIAGNOSIS — N760 Acute vaginitis: Secondary | ICD-10-CM | POA: Diagnosis not present

## 2015-09-02 LAB — POCT WET PREP (WET MOUNT): Clue Cells Wet Prep Whiff POC: NEGATIVE

## 2015-09-02 MED ORDER — FLUCONAZOLE 150 MG PO TABS: 150.0000 mg | ORAL_TABLET | Freq: Once | ORAL | Status: DC

## 2015-09-02 MED ORDER — METRONIDAZOLE 500 MG PO TABS: 500.0000 mg | ORAL_TABLET | Freq: Two times a day (BID) | ORAL | Status: DC

## 2015-09-02 MED FILL — metroNIDAZOLE 500 MG TABS: 500 | 7 days supply | Qty: 14 | Fill #0

## 2015-09-02 MED FILL — FLUCONAZOLE 150 MG TABLET: 150 | 3 days supply | Qty: 2 | Fill #0

## 2015-09-02 NOTE — Patient Instructions (Signed)
Take diflucan 1 pill today. Repeat in 3 days if not better  Follow up if not improving with this regimen I'll call you once we have all the test results back  Be well, Dr. Ardelia Mems

## 2015-09-03 LAB — CERVICOVAGINAL ANCILLARY ONLY
CHLAMYDIA, DNA PROBE: NEGATIVE
Neisseria Gonorrhea: NEGATIVE
TRICH (WINDOWPATH): POSITIVE — AB

## 2015-09-03 NOTE — Progress Notes (Signed)
Date of Visit: 09/02/2015   HPI:  Patient presents for a same day appointment to discuss vaginitis.  Has had odor and clumping discharge from vagina since Friday. Recently took antibiotics a few weeks ago (amoxicillin). Thinks has yeast infection. Did a 3 day over the counter vaginal suppository yeast treatment, but thinks the suppositories fell out. She then got a 7d over the counter yeast cream treatment, but after 1 day started her period so she stopped this treatment.  Sexually active with 2 female partners in the last year. No concern for STD's but is willing to be tested today. Denies pelvic pain or fevers.    ROS: See HPI  Gillespie: history of cystic acne, asthma, allergic rhinitis, DUB, venous insufficiency   PHYSICAL EXAM: BP 125/79 mmHg  Pulse 77  Temp(Src) 98.3 F (36.8 C) (Oral)  Wt 220 lb (99.791 kg)  LMP 09/02/2015 Gen: NAD, pleasant, cooperative GU: normal appearing external genitalia without lesions. Vagina is moist with moderate menstrual blood present. Visualized portion of cervix normal in appearance. No cervical motion tenderness or tenderness on bimanual exam. No adnexal masses.   ASSESSMENT/PLAN:  1. Vaginal discharge:  - treat empirically for yeast with diflucan 150mg  x1, repeat in 3 days if not better - send gc/chl/trich as well as wet prep today - wet prep POSITIVE for trichomonas - called patient right after her visit to inform. Advised she and partner need treatment, abstain from intercourse for 7 days after each are treated. rx metronidazole 500mg  twice daily for 7 days  FOLLOW UP: Follow up as needed if symptoms worsen or fail to improve.    [place]. Ardelia Mems, [place]

## 2015-09-06 ENCOUNTER — Telehealth: Payer: Self-pay | Admitting: Family Medicine

## 2015-09-06 NOTE — Telephone Encounter (Signed)
Attempted to reach patient to let her know about negative gc/chlamydia. She was already informed via phone of positive trichomonas result after her recent visit and was prescribed treatment for it.  She did not answer after 2 calls. Voicemail answered for a different person Enid Derry?). Unclear to me if this is her nickname.  Will wait to see if she calls back. Red team, can you try to call her tomorrow just to let her know that gc/chlamydia was negative?  Leeanne Rio, MD

## 2015-09-07 NOTE — Telephone Encounter (Signed)
Patient informed. 

## 2015-09-23 ENCOUNTER — Encounter: Payer: Self-pay | Admitting: Obstetrics and Gynecology

## 2015-09-23 ENCOUNTER — Ambulatory Visit (INDEPENDENT_AMBULATORY_CARE_PROVIDER_SITE_OTHER): Payer: 59 | Admitting: Obstetrics and Gynecology

## 2015-09-23 VITALS — BP 140/77 | HR 95 | Temp 98.5°F | Wt 222.0 lb

## 2015-09-23 DIAGNOSIS — N898 Other specified noninflammatory disorders of vagina: Secondary | ICD-10-CM | POA: Diagnosis not present

## 2015-09-23 DIAGNOSIS — N949 Unspecified condition associated with female genital organs and menstrual cycle: Secondary | ICD-10-CM | POA: Diagnosis not present

## 2015-09-23 DIAGNOSIS — A599 Trichomoniasis, unspecified: Secondary | ICD-10-CM

## 2015-09-23 LAB — POCT WET PREP (WET MOUNT): Clue Cells Wet Prep Whiff POC: NEGATIVE

## 2015-09-23 LAB — POCT URINALYSIS DIPSTICK
Bilirubin, UA: NEGATIVE
Glucose, UA: NEGATIVE
KETONES UA: NEGATIVE
NITRITE UA: NEGATIVE
PROTEIN UA: NEGATIVE
Spec Grav, UA: 1.025
Urobilinogen, UA: 0.2
pH, UA: 6

## 2015-09-23 LAB — POCT UA - MICROSCOPIC ONLY: Trichomonas, UA: POSITIVE

## 2015-09-23 MED ORDER — METRONIDAZOLE 500 MG PO TABS: 500.0000 mg | ORAL_TABLET | Freq: Two times a day (BID) | ORAL | Status: DC

## 2015-09-23 MED ORDER — FLUCONAZOLE 150 MG PO TABS: 150.0000 mg | ORAL_TABLET | Freq: Once | ORAL | Status: DC | PRN

## 2015-09-23 MED FILL — metroNIDAZOLE 500 MG TABS: 500 | 7 days supply | Qty: 14 | Fill #0

## 2015-09-23 MED FILL — FLUCONAZOLE 150 MG TABLET: 150 | 1 days supply | Qty: 1 | Fill #0

## 2015-09-23 NOTE — Progress Notes (Signed)
Patient ID: Donna Carlson, female   DOB: 09/06/1975, 40 y.o.   MRN: [id number] VAGINAL DISCHARGE Patient presents with 1 day of vaginal discharge following recent completion of treatment for trichomonas and prophylactic treatment of vaginal yeast infection.   Having vaginal discharge for 1 day Medications tried: Patient was dx with trichomonas 2 weeks ago and completed a course of Flagyl and Diflucan Discharge description: Moderately thick, green/Dillian Feig in color, no odor or pruritis associated with it Recent antibiotic use: Yes, Flagyl Sex in last month: Yes, but states she has not had intercourse with anyone since being treated 2 weeks ago Possible STD exposure:  Yes  Symptoms Fever: No Dysuria:  No Vaginal bleeding: No Abdomen or Pelvic pain: No Back pain: Yes, chronic left lower back pain which is relieved by use of Icy Hot Genital sores or ulcers:  No Rash: No Pain during sex: No Missed menstrual period: No  ROS see HPI Smoking Status noted   ROS: Per HPI  Objective: Office vital signs reviewed. BP 140/77 mmHg  Pulse 95  Temp(Src) 98.5 F (36.9 C) (Oral)  Wt 222 lb (100.699 kg)  LMP 09/02/2015  Physical Examination:  General: Awake, alert, well- nourished, NAD GI: Soft, NT/ND,+BS x4,  GU: No CVA tenderness. Pelvic exam: Normal external genitalia.  Copious yellow/green discharge in vaginal vault, otherwise normal vulva, vagina and cervix. Skin: dry, intact, no rashes or lesions   Assessment/ Plan: 40 y.o. female presents with vaginal discharge following completion of treatment for trichomonas 2 weeks ago.  Patient denies being sexually active and states she has stopped seeing the partner she was having sex with prior to trichomonas diagnosis.  Urinalysis not suggestive of UTI.  Wet prep indicates trichomonas without clue cells.  Patient has recurrent trichomonas without BV.  Discussed hygiene with patient and how trichomonas is transmitted.  Will restart Flagyl  500 mg BID for 7 days.  Diflucan prescribed as needed after completion of flagyl.    1. Vaginal discomfort - POCT Wet Prep [facility]) - Urinalysis Dipstick - POCT UA - Microscopic Only   Follow-up: If patient does not improve or gets worse, she is to return to clinic for further consideration of treatment options.   Sherron Ales, NP [facility]

## 2015-09-23 NOTE — Patient Instructions (Signed)
Trichomoniasis Trichomoniasis is an infection caused by an organism called Trichomonas. The infection can affect both women and men. In women, the outer female genitalia and the vagina are affected. In men, the penis is mainly affected, but the prostate and other reproductive organs can also be involved. Trichomoniasis is a sexually transmitted infection (STI) and is most often passed to another person through sexual contact.  RISK FACTORS  Having unprotected sexual intercourse.  Having sexual intercourse with an infected partner. SIGNS AND SYMPTOMS  Symptoms of trichomoniasis in women include:  Abnormal gray-green frothy vaginal discharge.  Itching and irritation of the vagina.  Itching and irritation of the area outside the vagina. Symptoms of trichomoniasis in men include:   Penile discharge with or without pain.  Pain during urination. This results from inflammation of the urethra. DIAGNOSIS  Trichomoniasis may be found during a Pap test or physical exam. Your health care provider may use one of the following methods to help diagnose this infection:  Testing the pH of the vagina with a test tape.  Using a vaginal swab test that checks for the Trichomonas organism. A test is available that provides results within a few minutes.  Examining a urine sample.  Testing vaginal secretions. Your health care provider may test you for other STIs, including HIV. TREATMENT   You may be given medicine to fight the infection. Women should inform their health care provider if they could be or are pregnant. Some medicines used to treat the infection should not be taken during pregnancy.  Your health care provider may recommend over-the-counter medicines or creams to decrease itching or irritation.  Your sexual partner will need to be treated if infected.  Your health care provider may test you for infection again 3 months after treatment. HOME CARE INSTRUCTIONS   Take medicines only as  directed by your health care provider.  Take over-the-counter medicine for itching or irritation as directed by your health care provider.  Do not have sexual intercourse while you have the infection.  Women should not douche or wear tampons while they have the infection.  Discuss your infection with your partner. Your partner may have gotten the infection from you, or you may have gotten it from your partner.  Have your sex partner get examined and treated if necessary.  Practice safe, informed, and protected sex.  See your health care provider for other STI testing. SEEK MEDICAL CARE IF:   You still have symptoms after you finish your medicine.  You develop abdominal pain.  You have pain when you urinate.  You have bleeding after sexual intercourse.  You develop a rash.  Your medicine makes you sick or makes you throw up (vomit). MAKE SURE YOU:  Understand these instructions.  Will watch your condition.  Will get help right away if you are not doing well or get worse.   This information is not intended to replace advice given to you by your health care provider. Make sure you discuss any questions you have with your health care provider.   Document Released: 01/31/2001 Document Revised: 08/28/2014 Document Reviewed: 05/19/2013 Elsevier Interactive Patient Education 2016 Elsevier Inc.  

## 2015-10-04 ENCOUNTER — Encounter: Payer: Self-pay | Admitting: Family Medicine

## 2015-10-04 ENCOUNTER — Ambulatory Visit (INDEPENDENT_AMBULATORY_CARE_PROVIDER_SITE_OTHER): Payer: 59 | Admitting: Family Medicine

## 2015-10-04 VITALS — BP 145/90 | Temp 98.6°F | Wt 225.7 lb

## 2015-10-04 DIAGNOSIS — J45998 Other asthma: Secondary | ICD-10-CM

## 2015-10-04 DIAGNOSIS — J029 Acute pharyngitis, unspecified: Secondary | ICD-10-CM

## 2015-10-04 DIAGNOSIS — IMO0002 Reserved for concepts with insufficient information to code with codable children: Secondary | ICD-10-CM

## 2015-10-04 LAB — POCT RAPID STREP A (OFFICE): RAPID STREP A SCREEN: NEGATIVE

## 2015-10-04 NOTE — Assessment & Plan Note (Signed)
She reports worsening symptoms of asthma and having to use albuterol more frequently.  However, currently does not have any hypoxia, tachypnea or wheezing on exam.  No current indication for asthma exacerbation requiring steroids - Recommended continuing albuterol when necessary - Advised to call if she develops chest pain, difficulty breathing or wheezing

## 2015-10-04 NOTE — Progress Notes (Signed)
   Subjective:    Patient ID: Donna Carlson, female    DOB: [birth year], 40 y.o.   MRN: [id number]  Seen for Same day visit for   CC: sore throat  SORE THROAT  Sore throat began 3 days ago. Pain is: burning Severity: moderate Medications tried: OTC Strep throat exposure: no STD exposure: no  Symptoms Fever: subjective Cough: no Runny nose: no Muscle aches: no Swollen Glands: no Trouble breathing: yes - Hx of asthma on medication Drooling: no Weight loss: no  Review of Symptoms - see HPI PMH - Smoking status noted.    Objective:  BP 145/90 mmHg  Temp(Src) 98.6 F (37 C) (Oral)  Wt 225 lb 11.2 oz (102.377 kg)  LMP 09/02/2015 (Exact Date)  General: NAD HEENT: Pharyngeal erythema without tonsillar exudates; no cervical adenopathy Cardiac: RRR, normal heart sounds, no murmurs. 2+ radial and PT pulses bilaterally Respiratory: CTAB, normal effort   Assessment & Plan:   Asthma, persistent She reports worsening symptoms of asthma and having to use albuterol more frequently.  However, currently does not have any hypoxia, tachypnea or wheezing on exam.  No current indication for asthma exacerbation requiring steroids - Recommended continuing albuterol when necessary - Advised to call if she develops chest pain, difficulty breathing or wheezing   Sore throat - Likely related to a viral URI and postnasal drip - Rapid strep negative - See AVS for symptomatically therapy

## 2015-10-04 NOTE — Patient Instructions (Signed)
It was great seeing you today. Your symptoms are due to a viral illness. Antibiotics will not help improve your symptoms, but the following will help you feel better while your body fights the virus.   Drink lots of water (Guaifenesin "Mucinex")  Nasal Saline Spray  Pain/Sore throat: Tylenol, Ibuprofen  Cough: Albuterol as needed Wash your hands often to prevent spreading the virus  Next Appointment  Please call to make an appointment if your symptoms worsen and you develop chest pain or difficulty breathing that is not improved with using your albuterol   I look forward to talking with you again at our next visit. If you have any questions or concerns before then, please call the clinic at [phone].  Take Care,   Dr Phill Myron

## 2015-10-05 LAB — STREP A DNA PROBE: GASP: NOT DETECTED

## 2015-10-20 ENCOUNTER — Other Ambulatory Visit: Payer: Self-pay | Admitting: *Deleted

## 2015-10-20 DIAGNOSIS — IMO0002 Reserved for concepts with insufficient information to code with codable children: Secondary | ICD-10-CM

## 2015-10-20 IMAGING — CR DG CHEST 2V
2 series · 2 of 2 positions shown · non-contrast
Comparison: 06/14/2014

CLINICAL DATA: Cough. Chest congestion. Short of breath. Symptoms
for 3 days.

EXAM:
CHEST  2 VIEW

[w chest pa]
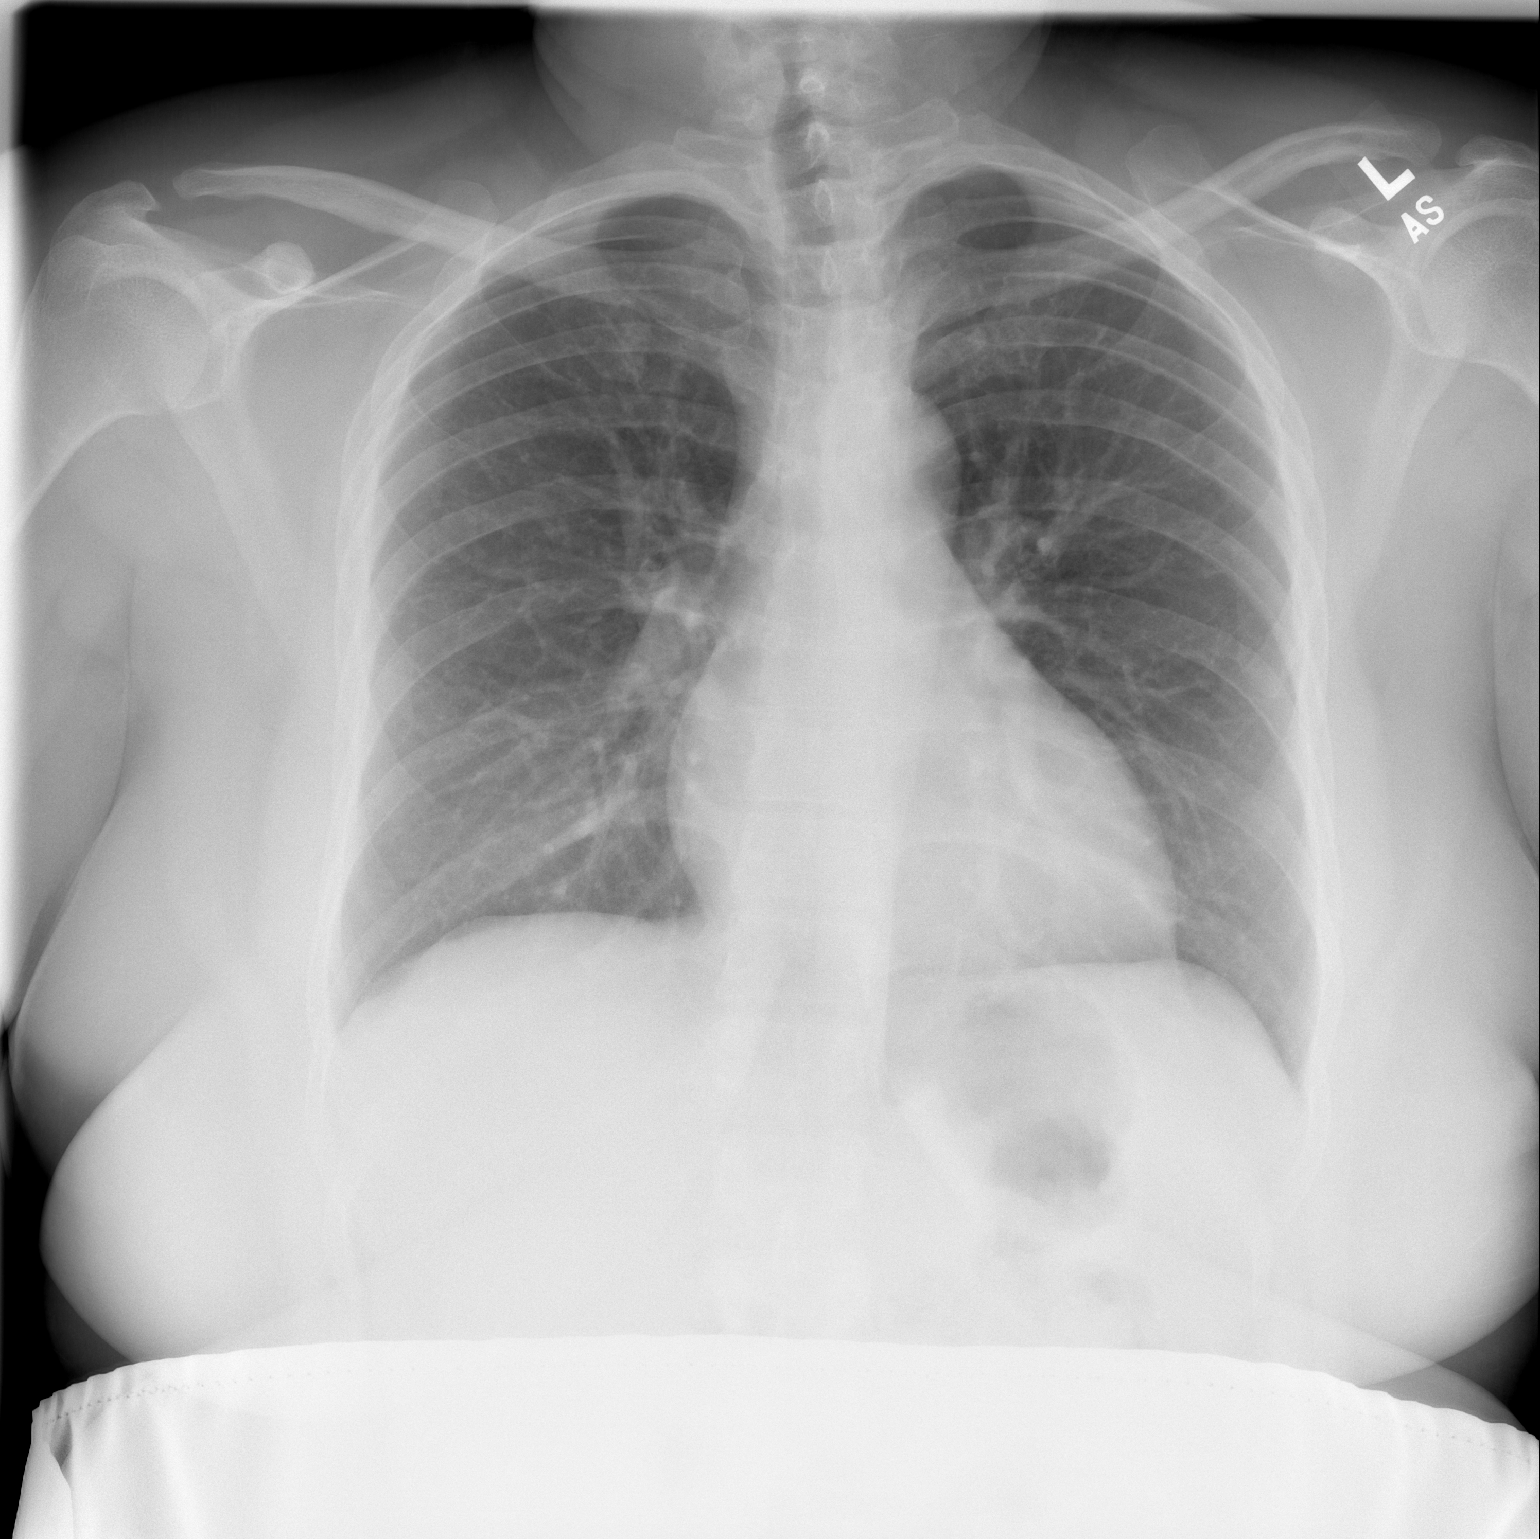

[w chest lat]
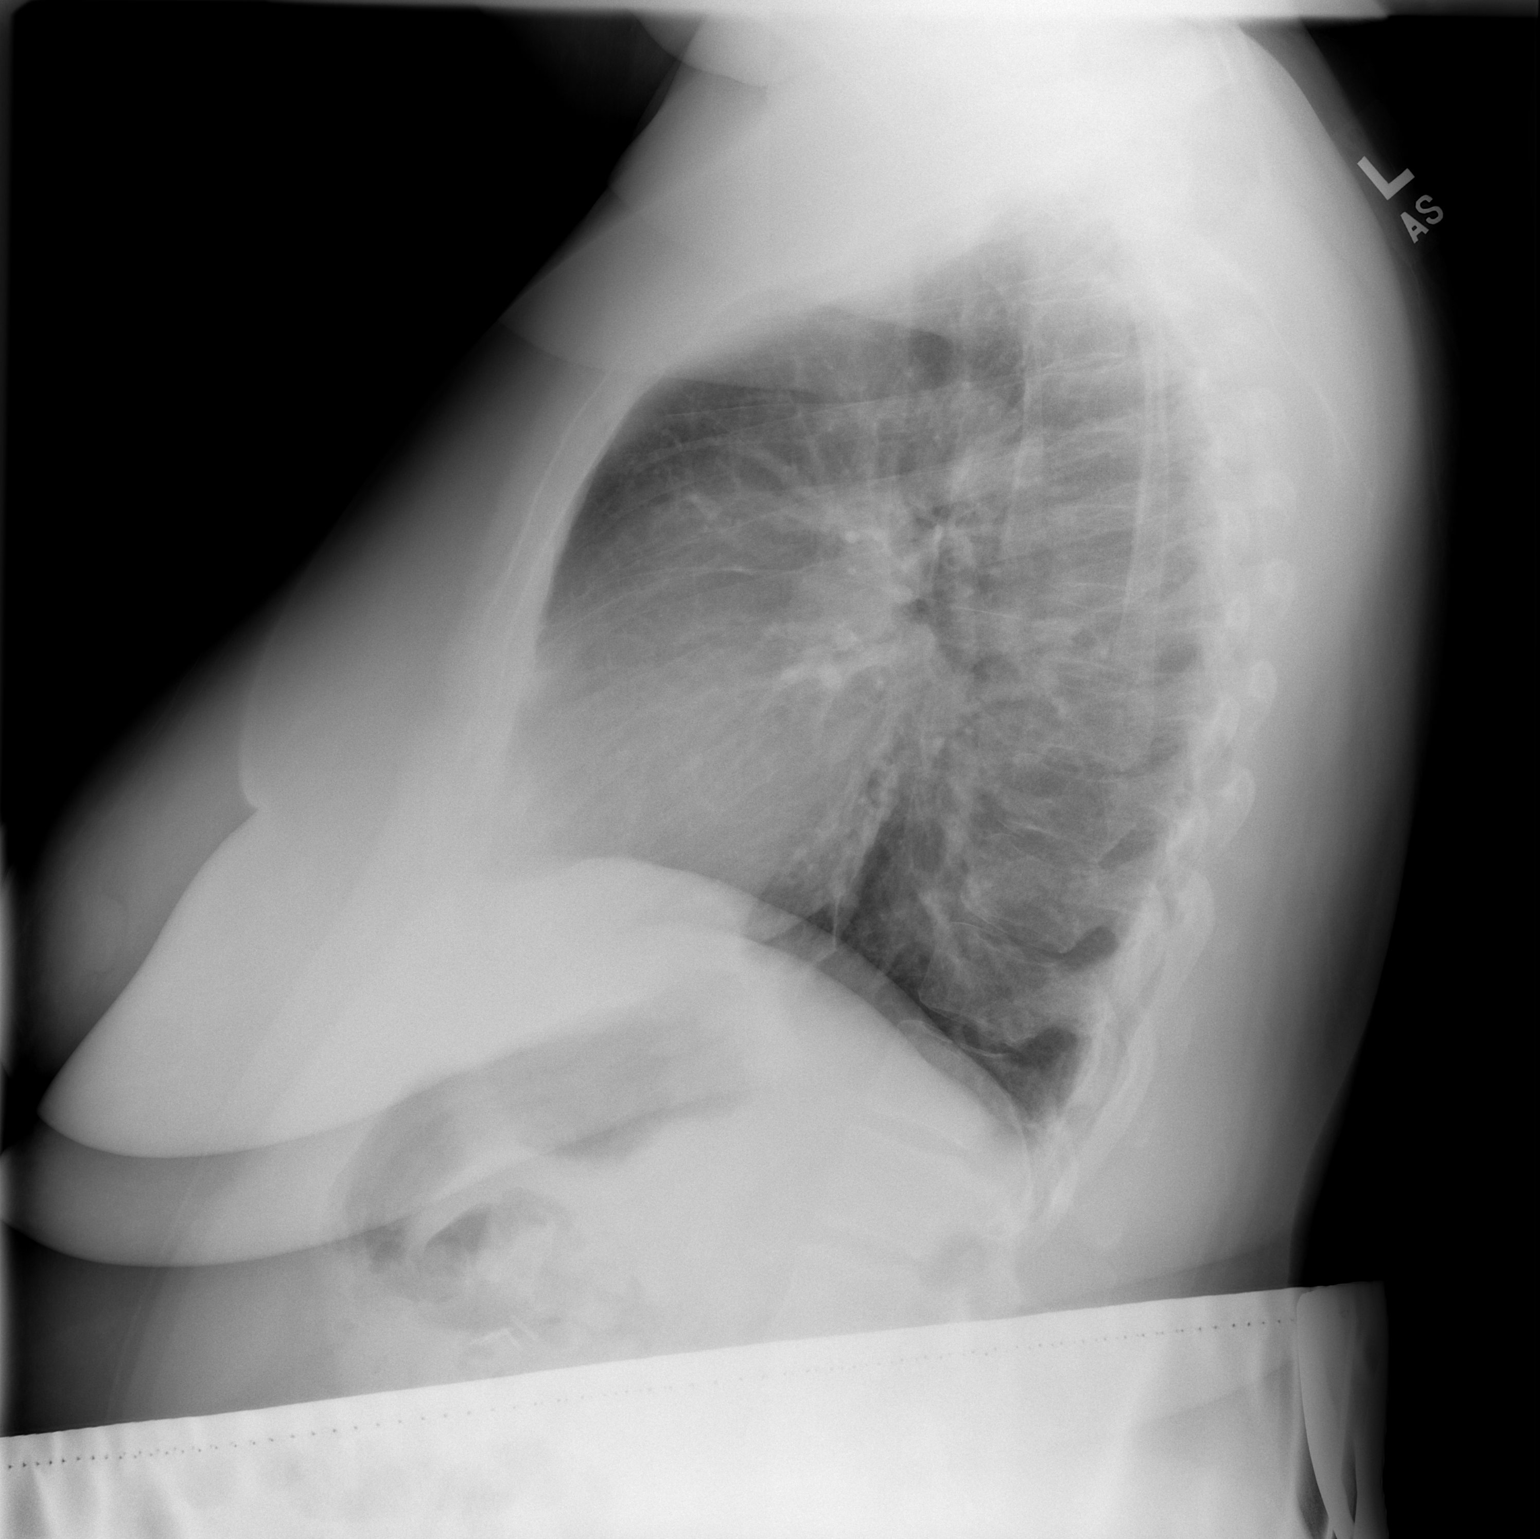

[2 of 2 positions shown; findings below may reference images not displayed]

FINDINGS: The heart size and mediastinal contours are within normal limits.
Both lungs are clear. No pleural effusion or pneumothorax. The
visualized skeletal structures are unremarkable.
IMPRESSION: No active cardiopulmonary disease.

## 2015-10-21 MED ORDER — MONTELUKAST SODIUM 10 MG PO TABS
10.0000 mg | ORAL_TABLET | Freq: Every day | ORAL | Status: DC
Start: 2015-10-21 — End: 2016-09-20

## 2015-10-21 MED FILL — MONTELUKAST SOD 10 MG TAB: 10 | 30 days supply | Qty: 30 | Fill #0

## 2015-10-21 MED FILL — CHLORHEXIDINE 0.12% RINSE: 0.12 | 25 days supply | Qty: 473 | Fill #0

## 2015-10-25 ENCOUNTER — Other Ambulatory Visit: Payer: Self-pay | Admitting: Family Medicine

## 2015-10-26 MED FILL — VENTOLIN HFA 90 MCG INHALER: 108 (90 BAS | 16 days supply | Qty: 18 | Fill #0

## 2015-12-06 MED FILL — ETODOLAC 400 MG TABLET: 400 | 7 days supply | Qty: 20 | Fill #0

## 2015-12-06 MED FILL — AMOXICILLIN 500 MG CAPSULE: 500 | 7 days supply | Qty: 28 | Fill #0

## 2016-01-26 MED FILL — MONTELUKAST SOD 10 MG TAB: 10 | 30 days supply | Qty: 30 | Fill #1

## 2016-02-05 ENCOUNTER — Ambulatory Visit (HOSPITAL_COMMUNITY)
Admission: EM | Admit: 2016-02-05 | Discharge: 2016-02-05 | Disposition: A | Discharge status: Home / Self Care | Payer: 59 | Attending: Emergency Medicine | Admitting: Emergency Medicine

## 2016-02-05 ENCOUNTER — Encounter (HOSPITAL_COMMUNITY): Payer: Self-pay | Admitting: Emergency Medicine

## 2016-02-05 DIAGNOSIS — L03116 Cellulitis of left lower limb: Secondary | ICD-10-CM | POA: Diagnosis not present

## 2016-02-05 MED ORDER — SULFAMETHOXAZOLE-TRIMETHOPRIM 800-160 MG PO TABS: 2.0000 | ORAL_TABLET | Freq: Two times a day (BID) | ORAL | Status: DC

## 2016-02-05 MED ORDER — CHLORHEXIDINE GLUCONATE 4 % EX LIQD: Freq: Every day | CUTANEOUS | Status: DC | PRN

## 2016-02-05 NOTE — Discharge Instructions (Signed)
800 mg of ibuprofen with 1 g of Tylenol 3 times a day as needed for pain. Start some Claritin or Zyrtec to help with the itching. Follow up with your primary care physician as needed. Go to the ER for pain not controlled with medications, if it becomes significantly worse despite being on the antibiotics or other concerns

## 2016-02-05 NOTE — ED Notes (Signed)
Pt reports mosquito bite to LLE onset yest... Sx include swelling and pain... Steady gait... A&O x4... No acute distress.

## 2016-02-05 NOTE — ED Provider Notes (Signed)
HPI  SUBJECTIVE:  Donna Carlson is a 40 y.o. female who presents with a painful, pruritic erythematous area of gradually increasing size on her left upper thigh. Describes pain as throbbing. States that she was bitten by a mosquito yesterday.  Sx worse with standing up, palpation. Symptoms are better with cool compresses. She has tried cold compresses and an unknown ointment. Has not tried anything for pain. No N/V, fevers, drainage, No bodyaches, crusting, drainage.  Does not recall other trauma to area. No contacts with similar lesions.  -  H/o MRSA skin infections. No h/o DM, HIV, artifical joints or heart valves. PMD: Dr. Phill Myron at [facility]. LMP now, denies possibility of being pregnant.   Past Medical History  Diagnosis Date  . Palpitations   . Asthma     Past Surgical History  Procedure Laterality Date  . Gallbladder surgery    . Tubal ligation      Family History  Problem Relation Age of Onset  . Hypertension Mother   . Hypertension Maternal Grandmother   . Heart attack Paternal Grandfather     Social History  Substance Use Topics  . Smoking status: Never Smoker   . Smokeless tobacco: None  . Alcohol Use: Yes    No current facility-administered medications for this encounter.  Current outpatient prescriptions:  .  albuterol (PROVENTIL) (2.5 MG/3ML) 0.083% nebulizer solution, Take 3 mLs (2.5 mg total) by nebulization every 4 (four) hours as needed for wheezing or shortness of breath., Disp: 75 mL, Rfl: 0 .  montelukast (SINGULAIR) 10 MG tablet, Take 1 tablet (10 mg total) by mouth at bedtime., Disp: 30 tablet, Rfl: 3 .  acetaminophen (TYLENOL) 500 MG tablet, Take 500 mg by mouth every 6 (six) hours as needed for mild pain., Disp: , Rfl:  .  chlorhexidine (HIBICLENS) 4 % external liquid, Apply topically daily as needed. Dilute 10-15 mL in water, Use daily when bathing for 1-2 weeks, Disp: 120 mL, Rfl: 0 .  Fluticasone-Salmeterol (ADVAIR) 500-50  MCG/DOSE AEPB, Inhale 1 puff into the lungs 2 (two) times daily., Disp: 60 each, Rfl: 0 .  loratadine (CLARITIN) 10 MG tablet, Take 1 tablet (10 mg total) by mouth daily., Disp: 30 tablet, Rfl: 6 .  sulfamethoxazole-trimethoprim (BACTRIM DS,SEPTRA DS) 800-160 MG tablet, Take 2 tablets by mouth 2 (two) times daily., Disp: 40 tablet, Rfl: 0 .  VENTOLIN HFA 108 (90 Base) MCG/ACT inhaler, INHALE 2 PUFFS BY MOUTH 2 TIMES DAILY AS NEEDED FOR WHEEZING OR SHORTNESS OF BREATH, Disp: 18 g, Rfl: 3  Allergies  Allergen Reactions  . Shellfish Allergy Anaphylaxis  . Iodine Rash     ROS  As noted in HPI.   Physical Exam  BP 124/79 mmHg  Pulse 85  Temp(Src) 98.4 F (36.9 C) (Oral)  Resp 20  SpO2 100%  Constitutional: Well developed, well nourished, no acute distress Eyes:  EOMI, conjunctiva normal bilaterally HENT: Normocephalic, atraumatic,mucus membranes moist Respiratory: Normal inspiratory effort Cardiovascular: Normal rate GI: nondistended skin: 4.5 x 3.5 tender area of erythema, induration left medial thigh. No expressible purulent drainage or central fluctuance. Musculoskeletal: no deformities Neurologic: Alert & oriented x 3, no focal neuro deficits Psychiatric: Speech and behavior appropriate   ED Course   Medications - No data to display  No orders of the defined types were placed in this encounter.    No results found for this or any previous visit (from the past 24 hour(s)). No results found.  ED Clinical Impression  Cellulitis of left lower extremity  ED Assessment/Plan  Presentation most consistent with a cellulitis. There is nothing to I&D at this time . We will send home with Bactrim, warm compresses, Claritin and Zyrtec for the itching, ibuprofen 800 mg 3 times a day for pain and swelling. Patient states that she does not a prescription for this. Also home with Hibiclens surgical soap. Follow up with PMD in several days PRN or if not getting better.  Discussed   MDM, plan and followup with patient. Patient agrees with plan.  *This clinic note was created using Dragon dictation software. Therefore, there may be occasional mistakes despite careful proofreading.  ?     Melynda Ripple, MD 02/07/16 1015

## 2016-05-04 ENCOUNTER — Other Ambulatory Visit: Payer: Self-pay | Admitting: *Deleted

## 2016-05-04 DIAGNOSIS — IMO0002 Reserved for concepts with insufficient information to code with codable children: Secondary | ICD-10-CM

## 2016-05-04 DIAGNOSIS — J069 Acute upper respiratory infection, unspecified: Secondary | ICD-10-CM

## 2016-05-04 MED ORDER — FLUTICASONE-SALMETEROL 500-50 MCG/DOSE IN AEPB: 1.0000 | INHALATION_SPRAY | Freq: Two times a day (BID) | RESPIRATORY_TRACT | 0 refills | Status: DC

## 2016-05-04 MED FILL — MONTELUKAST SOD 10 MG TAB: 10 | 30 days supply | Qty: 30 | Fill #2

## 2016-05-04 MED FILL — VENTOLIN HFA 90 MCG INHALER: 108 (90 BAS | 16 days supply | Qty: 18 | Fill #1

## 2016-05-04 MED FILL — ADVAIR 500/50 DISKUS: 500-50 | 30 days supply | Qty: 60 | Fill #0

## 2016-05-04 NOTE — Telephone Encounter (Signed)
30 day supply given. Patient needs appointment for further refills.  Donna Carlson. Jerline Pain, [place] [facility] Resident PGY-3 05/04/2016 12:06 PM

## 2016-05-09 ENCOUNTER — Encounter: Payer: Self-pay | Admitting: Family Medicine

## 2016-05-09 ENCOUNTER — Ambulatory Visit (INDEPENDENT_AMBULATORY_CARE_PROVIDER_SITE_OTHER): Payer: 59 | Admitting: Family Medicine

## 2016-05-09 ENCOUNTER — Other Ambulatory Visit (HOSPITAL_COMMUNITY)
Admission: RE | Admit: 2016-05-09 | Discharge: 2016-05-09 | Disposition: A | Discharge status: Home / Self Care | Payer: 59 | Source: Ambulatory Visit | Attending: Family Medicine | Admitting: Family Medicine

## 2016-05-09 VITALS — BP 123/64 | HR 81 | Temp 99.1°F | Wt 210.0 lb

## 2016-05-09 DIAGNOSIS — N898 Other specified noninflammatory disorders of vagina: Secondary | ICD-10-CM | POA: Diagnosis not present

## 2016-05-09 DIAGNOSIS — Z113 Encounter for screening for infections with a predominantly sexual mode of transmission: Secondary | ICD-10-CM | POA: Diagnosis not present

## 2016-05-09 DIAGNOSIS — A599 Trichomoniasis, unspecified: Secondary | ICD-10-CM

## 2016-05-09 LAB — POCT WET PREP (WET MOUNT): Clue Cells Wet Prep Whiff POC: NEGATIVE

## 2016-05-09 MED ORDER — FLUCONAZOLE 150 MG PO TABS: 150.0000 mg | ORAL_TABLET | Freq: Once | ORAL | 0 refills | Status: AC

## 2016-05-09 MED ORDER — METRONIDAZOLE 250 MG PO TABS
2000.0000 mg | ORAL_TABLET | Freq: Once | ORAL | Status: AC
  Administered 2016-05-09: 2000 mg via ORAL

## 2016-05-09 MED FILL — CHLORHEXIDINE 0.12% RINSE: 0.12 | 17 days supply | Qty: 473 | Fill #0

## 2016-05-09 MED FILL — FLUCONAZOLE 150 MG TABLET: 150 | 1 days supply | Qty: 1 | Fill #0

## 2016-05-09 NOTE — Progress Notes (Signed)
   Subjective:  Donna Carlson is a 40 y.o. female who presents to the [facility] today with a chief complaint of vaginal discharge.   HPI:  Vaginal Discharge Started 5 days ago. Described as thick, yellowish discharge. Thought she had a yeast infection, so started monostat which helped some. Last sexually active 2 weeks ago and used a condom. No fevers or chills. No abdominal pain. No nausea or vomiting.   ROS: Per HPI  PMH: Smoking history reviewed.   Objective:  Physical Exam: BP 123/64   Pulse 81   Temp 99.1 F (37.3 C) (Oral)   Wt 210 lb (95.3 kg)   LMP 04/04/2016   SpO2 98%   BMI 38.41 kg/m   Gen: NAD, resting comfortably GU: Normal external female genitalia. Yellow-green discharge noted in vaginal vault. No CMT. GI: Normal bowel sounds present. Soft, Nontender, Nondistended. Skin: warm, dry Neuro: grossly normal, moves all extremities Psych: Normal affect and thought content  Results for orders placed or performed in visit on 05/09/16 (from the past 72 hour(s))  POCT Wet Prep [facility])     Status: Abnormal   Collection Time: 05/09/16 11:10 AM  Result Value Ref Range   Source Wet Prep POC VAG    WBC, Wet Prep HPF POC TOO MANY TO COUNT    Bacteria Wet Prep HPF POC Many (A) None, Few, Too numerous to count   Clue Cells Wet Prep HPF POC None None, Too numerous to count   Clue Cells Wet Prep Whiff POC Negative Whiff    Yeast Wet Prep HPF POC None    Trichomonas Wet Prep HPF POC Present (A) Absent     Assessment/Plan:  Vaginal Discharge / Trichomoniasis Will treat in office with 2000mg  of flagyl. Told patient that she should talk to her recent sexual partners. Given that she may have had some improvement with monostat, will also treat with a single dose of diflucan to cover for any possible partially treated yeast infection. Return precautions reviewed. Follow up as needed.   Algis Greenhouse. Jerline Pain, [place] [facility] Resident PGY-3 05/09/2016 12:03 PM

## 2016-05-09 NOTE — Patient Instructions (Signed)
Trichomoniasis Trichomoniasis is an infection caused by an organism called Trichomonas. The infection can affect both women and men. In women, the outer female genitalia and the vagina are affected. In men, the penis is mainly affected, but the prostate and other reproductive organs can also be involved. Trichomoniasis is a sexually transmitted infection (STI) and is most often passed to another person through sexual contact.  RISK FACTORS  Having unprotected sexual intercourse.  Having sexual intercourse with an infected partner. SIGNS AND SYMPTOMS  Symptoms of trichomoniasis in women include:  Abnormal gray-green frothy vaginal discharge.  Itching and irritation of the vagina.  Itching and irritation of the area outside the vagina. Symptoms of trichomoniasis in men include:   Penile discharge with or without pain.  Pain during urination. This results from inflammation of the urethra. DIAGNOSIS  Trichomoniasis may be found during a Pap test or physical exam. Your health care provider may use one of the following methods to help diagnose this infection:  Testing the pH of the vagina with a test tape.  Using a vaginal swab test that checks for the Trichomonas organism. A test is available that provides results within a few minutes.  Examining a urine sample.  Testing vaginal secretions. Your health care provider may test you for other STIs, including HIV. TREATMENT   You may be given medicine to fight the infection. Women should inform their health care provider if they could be or are pregnant. Some medicines used to treat the infection should not be taken during pregnancy.  Your health care provider may recommend over-the-counter medicines or creams to decrease itching or irritation.  Your sexual partner will need to be treated if infected.  Your health care provider may test you for infection again 3 months after treatment. HOME CARE INSTRUCTIONS   Take medicines only as  directed by your health care provider.  Take over-the-counter medicine for itching or irritation as directed by your health care provider.  Do not have sexual intercourse while you have the infection.  Women should not douche or wear tampons while they have the infection.  Discuss your infection with your partner. Your partner may have gotten the infection from you, or you may have gotten it from your partner.  Have your sex partner get examined and treated if necessary.  Practice safe, informed, and protected sex.  See your health care provider for other STI testing. SEEK MEDICAL CARE IF:   You still have symptoms after you finish your medicine.  You develop abdominal pain.  You have pain when you urinate.  You have bleeding after sexual intercourse.  You develop a rash.  Your medicine makes you sick or makes you throw up (vomit). MAKE SURE YOU:  Understand these instructions.  Will watch your condition.  Will get help right away if you are not doing well or get worse.   This information is not intended to replace advice given to you by your health care provider. Make sure you discuss any questions you have with your health care provider.   Document Released: 01/31/2001 Document Revised: 08/28/2014 Document Reviewed: 05/19/2013 Elsevier Interactive Patient Education 2016 Elsevier Inc.  

## 2016-05-10 ENCOUNTER — Encounter: Payer: Self-pay | Admitting: Family Medicine

## 2016-05-10 LAB — CERVICOVAGINAL ANCILLARY ONLY
CHLAMYDIA, DNA PROBE: NEGATIVE
NEISSERIA GONORRHEA: NEGATIVE

## 2016-05-31 ENCOUNTER — Other Ambulatory Visit: Payer: Self-pay | Admitting: Family Medicine

## 2016-07-28 ENCOUNTER — Other Ambulatory Visit: Payer: Self-pay | Admitting: Family Medicine

## 2016-07-28 DIAGNOSIS — J069 Acute upper respiratory infection, unspecified: Secondary | ICD-10-CM

## 2016-07-28 DIAGNOSIS — IMO0002 Reserved for concepts with insufficient information to code with codable children: Secondary | ICD-10-CM

## 2016-07-28 MED FILL — VENTOLIN HFA 90 MCG INHALER: 108 (90 BAS | 25 days supply | Qty: 18 | Fill #2

## 2016-07-28 MED FILL — ADVAIR 500/50 DISKUS: 500-50 | 30 days supply | Qty: 60 | Fill #0

## 2016-07-28 MED FILL — CHLORHEXIDINE 0.12% RINSE: 0.12 | 17 days supply | Qty: 473 | Fill #1

## 2016-07-28 MED FILL — MONTELUKAST SOD 10 MG TAB: 10 | 30 days supply | Qty: 30 | Fill #3

## 2016-07-28 NOTE — Telephone Encounter (Signed)
Left message for patient to call office. Will try patient again. If she does call back please schedule her an appointment per pcp. Thanks. Nat Christen, CMA

## 2016-07-28 NOTE — Telephone Encounter (Signed)
Rx filled. Patient needs office appointment soon.  Algis Greenhouse. Jerline Pain, [place] Resident PGY-3 07/28/2016 8:49 AM

## 2016-08-04 ENCOUNTER — Telehealth: Payer: Self-pay | Admitting: Family Medicine

## 2016-08-04 NOTE — Telephone Encounter (Signed)
Lm for patient to call back.  Please schedule an appointment with PCP to go over FMLA forms.   Nakota Ackert,CMA

## 2016-08-04 NOTE — Telephone Encounter (Signed)
Received FMLA paperwork in box for patient. Patient needs office appointment to discuss.  Donna Carlson. Jerline Pain, [place] [facility] Resident PGY-3 08/04/2016 3:55 PM

## 2016-09-06 ENCOUNTER — Ambulatory Visit: Payer: Self-pay | Admitting: Family Medicine

## 2016-09-08 ENCOUNTER — Ambulatory Visit (INDEPENDENT_AMBULATORY_CARE_PROVIDER_SITE_OTHER): Payer: 59 | Admitting: Internal Medicine

## 2016-09-08 ENCOUNTER — Other Ambulatory Visit (HOSPITAL_COMMUNITY)
Admission: RE | Admit: 2016-09-08 | Discharge: 2016-09-08 | Disposition: A | Discharge status: Home / Self Care | Payer: 59 | Source: Ambulatory Visit | Attending: Family Medicine | Admitting: Family Medicine

## 2016-09-08 ENCOUNTER — Encounter: Payer: Self-pay | Admitting: Internal Medicine

## 2016-09-08 DIAGNOSIS — N898 Other specified noninflammatory disorders of vagina: Secondary | ICD-10-CM

## 2016-09-08 DIAGNOSIS — Z113 Encounter for screening for infections with a predominantly sexual mode of transmission: Secondary | ICD-10-CM | POA: Diagnosis not present

## 2016-09-08 LAB — POCT WET PREP (WET MOUNT)
Clue Cells Wet Prep Whiff POC: POSITIVE
Trichomonas Wet Prep HPF POC: ABSENT

## 2016-09-08 MED ORDER — METRONIDAZOLE 500 MG PO TABS: 500.0000 mg | ORAL_TABLET | Freq: Two times a day (BID) | ORAL | 0 refills | Status: DC

## 2016-09-08 MED ORDER — FLUCONAZOLE 150 MG PO TABS: 150.0000 mg | ORAL_TABLET | Freq: Once | ORAL | 0 refills | Status: AC

## 2016-09-08 MED FILL — metroNIDAZOLE 500 MG TABS: 500 | 7 days supply | Qty: 14 | Fill #0

## 2016-09-08 MED FILL — FLUCONAZOLE 150 MG TABLET: 150 | 3 days supply | Qty: 2 | Fill #0

## 2016-09-08 NOTE — Progress Notes (Signed)
   Subjective:    Donna Carlson - 41 y.o. female MRN [id number]  Date of birth: 29-Oct-1975  HPI  CHRSTINA MOENCH is here for SDA for vaginal discharge.  VAGINAL DISCHARGE  Having vaginal discharge for 1 days. Medications tried: None Discharge consistency: thick Discharge color: white  Recent antibiotic use: no Sex in last month: yes Possible STD exposure: wishes to be tested, does use condoms with sexual intercourse  Has had yeast infection and trichomonas several times.   Symptoms Fever: no Dysuria:no Vaginal bleeding: menses started today  Abdomen or Pelvic pain: no Back pain: no Genital sores or ulcers:no Rash: no Pain during sex: no Missed menstrual period: no   ROS see HPI Smoking Status noted   -  reports that she has never smoked. She has never used smokeless tobacco. - Review of Systems: Per HPI. - Past Medical History: Patient Active Problem List   Diagnosis Date Noted  . Sinusitis, bacterial 06/12/2015  . Cough 06/04/2015  . Allergic rhinitis 06/04/2015  . Acute bacterial conjunctivitis of both eyes 03/05/2015  . Lower abdominal pain 08/29/2013  . Generalized weakness 07/10/2013  . Venous insufficiency of leg 07/02/2013  . Vaginal discharge 08/10/2011  . Skin tag 10/11/2010  . DYSFUNCTIONAL UTERINE BLEEDING 04/10/2008  . Obesity 01/02/2008  . ACNE, CYSTIC 04/29/2007  . Asthma with exacerbation 11/19/2006  . Asthma, persistent 10/18/2006   - Medications: reviewed and updated   Objective:   Physical Exam BP 118/72   Pulse 77   Temp 99 F (37.2 C) (Oral)   Ht 5\' 4"  (1.626 m)   Wt 214 lb (97.1 kg)   LMP 09/08/2016   SpO2 97%   BMI 36.73 kg/m  Gen: NAD, alert, cooperative with exam, well-appearing GU/GYN: Exam performed in the presence of a chaperone. External genitalia within normal limits.  Vaginal mucosa pink, moist, normal rugae.  Nonfriable cervix without lesions. Moderate amount of blood from cervical os pooling in posterior  fornix. Bimanual exam revealed normal, nongravid uterus.  No cervical motion tenderness. No adnexal masses bilaterally.      Assessment & Plan:   Vaginal discharge Wet prep revealed results consistent with yeast and BV. Rx given for Flagyl. Instructed patient to take Flagyl for a few days prior to starting Diflucan for yeast.Testing for GC/Chlamydia today.     Phill Myron, D.O. 09/08/2016, 4:28 PM PGY-2, [place]

## 2016-09-08 NOTE — Assessment & Plan Note (Addendum)
Wet prep revealed results consistent with yeast and BV. Rx given for Flagyl. Instructed patient to take Flagyl for a few days prior to starting Diflucan for yeast.Testing for GC/Chlamydia today.

## 2016-09-12 LAB — CERVICOVAGINAL ANCILLARY ONLY
Chlamydia: NEGATIVE
Neisseria Gonorrhea: NEGATIVE

## 2016-09-13 ENCOUNTER — Ambulatory Visit: Payer: Self-pay | Admitting: Family Medicine

## 2016-09-14 ENCOUNTER — Telehealth: Payer: Self-pay

## 2016-09-14 NOTE — Telephone Encounter (Signed)
-----   Message from Nicolette Bang, DO sent at 09/14/2016  9:50 AM EST ----- Please call patient to let her know Gonorrhea and Chlamydia were negative.  Phill Myron, D.O. 09/14/2016, 9:50 AM PGY-2, [place]

## 2016-09-14 NOTE — Telephone Encounter (Signed)
Tired to call pt on home number. VM said the phone number belonged to someone else so I did not leave a message. If pt calls, please give pt info below. Ottis Stain, CMA

## 2016-09-20 ENCOUNTER — Ambulatory Visit (INDEPENDENT_AMBULATORY_CARE_PROVIDER_SITE_OTHER): Payer: 59 | Admitting: Family Medicine

## 2016-09-20 ENCOUNTER — Encounter: Payer: Self-pay | Admitting: Family Medicine

## 2016-09-20 DIAGNOSIS — J45909 Unspecified asthma, uncomplicated: Secondary | ICD-10-CM | POA: Diagnosis not present

## 2016-09-20 MED ORDER — MONTELUKAST SODIUM 10 MG PO TABS: 10.0000 mg | ORAL_TABLET | Freq: Every day | ORAL | 3 refills | Status: DC

## 2016-09-20 MED ORDER — ALBUTEROL SULFATE HFA 108 (90 BASE) MCG/ACT IN AERS: INHALATION_SPRAY | RESPIRATORY_TRACT | 3 refills | Status: DC

## 2016-09-20 MED ORDER — ALBUTEROL SULFATE (2.5 MG/3ML) 0.083% IN NEBU: 2.5000 mg | INHALATION_SOLUTION | RESPIRATORY_TRACT | 0 refills | Status: DC | PRN

## 2016-09-20 MED FILL — VENTOLIN HFA 90 MCG INHALER: 108 (90 BAS | 25 days supply | Qty: 18 | Fill #0

## 2016-09-20 MED FILL — MONTELUKAST SOD 10 MG TAB: 10 | 30 days supply | Qty: 30 | Fill #0

## 2016-09-20 NOTE — Patient Instructions (Signed)
We will refill your meds and send in your paper work.  Come back to see me in 6 months.  You are due for pap smear. Come back when you want to schedule this.  Take care,  Dr Jerline Pain

## 2016-09-20 NOTE — Progress Notes (Signed)
   Subjective:  Donna Carlson is a 41 y.o. female who presents to the [facility] today with a chief complaint of asthma.   HPI:  Asthma Doing well. Only using her albuterol every few months. Compliant with advair and singulair. No recent exacerbations. No night time awakenings.  ROS: Per HPI  Objective:  Physical Exam: BP 124/82   Pulse 99   Temp 98.9 F (37.2 C) (Oral)   Ht 5\' 4"  (1.626 m)   Wt 210 lb (95.3 kg)   LMP 09/08/2016   BMI 36.05 kg/m   Gen: NAD, resting comfortably CV: RRR with no murmurs appreciated Pulm: NWOB, CTAB with no crackles, wheezes, or rhonchi MSK: no edema, cyanosis, or clubbing noted Skin: warm, dry Neuro: grossly normal, moves all extremities Psych: Normal affect and thought content  Assessment/Plan:  Asthma, persistent Doing well on current regimen. Will refill albuterol, advair, and singulair today. FMLA form filled out today.    Algis Greenhouse. Jerline Pain, [place] Resident PGY-3 09/20/2016 12:09 PM

## 2016-09-20 NOTE — Assessment & Plan Note (Signed)
Doing well on current regimen. Will refill albuterol, advair, and singulair today. FMLA form filled out today.

## 2016-09-21 MED FILL — ADVAIR 500/50 DISKUS: 500-50 | 30 days supply | Qty: 60 | Fill #1

## 2016-12-12 ENCOUNTER — Ambulatory Visit (INDEPENDENT_AMBULATORY_CARE_PROVIDER_SITE_OTHER): Payer: 59 | Admitting: Student

## 2016-12-12 ENCOUNTER — Encounter: Payer: Self-pay | Admitting: Student

## 2016-12-12 DIAGNOSIS — R21 Rash and other nonspecific skin eruption: Secondary | ICD-10-CM | POA: Insufficient documentation

## 2016-12-12 MED FILL — CHLORHEXIDINE 0.12% RINSE: 0.12 | 17 days supply | Qty: 473 | Fill #2

## 2016-12-12 NOTE — Patient Instructions (Signed)
Follow up in the next two days if your foot is not improved If you have questions or concerns, call the office

## 2016-12-12 NOTE — Progress Notes (Signed)
   Subjective:    Patient ID: Donna Carlson, female    DOB: [birth year], 41 y.o.   MRN: [id number]   CC: right leg and foot swelling with redness  HPI: 41 y/o F presents for right leg and foot swelling with redness  Right leg and foot swelling with redness - noted 2 days ago after eating dinner at a restaurant - she took benadryl and hydrocortisone cream for it - she had some foot "soreness" where her she had the swelling but this is improving - this AM the redness and swellling resolved but she still has some soreness over her foot, no calf pain - she denies rash or swelling elsewhere, no SOB, lip or mouth swelling  Smoking status reviewed  Review of Systems  Per HPI, else denies chest pain, shortness of breath, abdominal pain    Objective:  BP 110/72   Pulse 95   Temp 98.3 F (36.8 C) (Oral)   Ht 5\' 4"  (1.626 m)   Wt 218 lb (98.9 kg)   LMP 11/02/2016   SpO2 99%   BMI 37.42 kg/m  Vitals and nursing note reviewed  General: NAD Cardiac: RRR Respiratory: CTAB, normal effort Extremities: no edema or cyanosis. WWP. Skin: dorsum of right foot with approximately 3 cm well circumscribed area of mild skin hyperpigmentation with some tenderness to palpation over the lateral aspect of it, no swelling, erythema, fluctuance or warmth, no calf erythema, swelling or tenderness, left calf wnl Neuro: alert and oriented, no focal deficits   Assessment & Plan:    Rash and nonspecific skin eruption Rash of unclear etiology but it appears to be resolving. No red flags on history or exam - will follow as needed, she will return if rash does not continue to resolve or gets worse    Alyssa A. Lincoln Brigham MD, [facility] Resident PGY-3 Pager [phone]

## 2016-12-12 NOTE — Assessment & Plan Note (Signed)
Rash of unclear etiology but it appears to be resolving. No red flags on history or exam - will follow as needed, she will return if rash does not continue to resolve or gets worse

## 2017-01-17 MED FILL — VENTOLIN HFA 90 MCG INHALER: 108 (90 BAS | 25 days supply | Qty: 18 | Fill #1

## 2017-01-17 MED FILL — MONTELUKAST SOD 10 MG TAB: 10 | 30 days supply | Qty: 30 | Fill #1

## 2017-01-19 MED FILL — AMOXICILLIN 875 MG TABLET: 875 | 10 days supply | Qty: 20 | Fill #0

## 2017-01-26 MED FILL — HYDROCODON-APAP 5-325: 5-325 | 3 days supply | Qty: 20 | Fill #0

## 2017-05-29 ENCOUNTER — Ambulatory Visit (INDEPENDENT_AMBULATORY_CARE_PROVIDER_SITE_OTHER): Payer: 59 | Admitting: Internal Medicine

## 2017-05-29 ENCOUNTER — Encounter: Payer: Self-pay | Admitting: Internal Medicine

## 2017-05-29 DIAGNOSIS — J4541 Moderate persistent asthma with (acute) exacerbation: Secondary | ICD-10-CM | POA: Diagnosis not present

## 2017-05-29 MED ORDER — PREDNISONE 50 MG PO TABS: 50.0000 mg | ORAL_TABLET | Freq: Every day | ORAL | 0 refills | Status: DC

## 2017-05-29 MED FILL — predniSONE 50 MG TABS: 50 | 5 days supply | Qty: 5 | Fill #0

## 2017-05-29 NOTE — Progress Notes (Signed)
   Subjective:   Patient: Donna Carlson       Birthdate: 1976/07/25       MRN: [id number]      HPI  Donna Carlson is a 41 y.o. female presenting for same day appointment for wheezing and sore throat.   Symptoms began about 3 days ago. Most troublesome symptom is wheezing and chest tightness. Patient has history of moderate persistent asthma and says typically symptoms worsen when she has a virus. Has required prednisone in the past to alleviate symptoms and says she feels as if she needs a prescription today. Has been using her albuterol inhaler every 2 hours for the past three days, as well as albuterol neb once yesterday. Last used albuterol two hours prior to appt. Has been using Singulair and Advair as prescribed. Reports sore throat that did not improve with Dayquil. Has not tried anything else to help with symptoms. Denies cough, fevers. Endorses some nasal congestion in the AM. Mother is sick as well. Endorses normal appetite but says she is drinking less due to sore throat. Denies N/V/D/C.   Smoking status reviewed. Patient is never smoker.   Review of Systems See HPI.     Objective:  Physical Exam  Constitutional: She is oriented to person, place, and time and well-developed, well-nourished, and in no distress.  HENT:  Head: Normocephalic and atraumatic.  Right Ear: External ear normal.  Left Ear: External ear normal.  Nose: Nose normal.  Mild oropharyngeal erythema. No exudates. MMM.   Eyes: Pupils are equal, round, and reactive to light. Conjunctivae and EOM are normal. Right eye exhibits no discharge. Left eye exhibits no discharge.  Neck: Normal range of motion. Neck supple.  Cardiovascular: Normal rate, regular rhythm and normal heart sounds.   No murmur heard. Pulmonary/Chest:  Wheezing bilaterally primarily lower lobes. Good air movement. Normal WOB on RA. Able to speak in full sentences.   Lymphadenopathy:    She has no cervical adenopathy.  Neurological: She  is alert and oriented to person, place, and time.  Skin: Skin is warm and dry.  Psychiatric: Affect and judgment normal.      Assessment & Plan:  Asthma with exacerbation Likely 2/2 viral URI. Has been requiring albuterol q2 for past three days. Still with wheezing on auscultation today despite using albuterol two hours prior to appointment. Normal WOB on RA however with no signs of respiratory distress. As patient has done well with prednisone burst in past, will prescribe prednisone 50mg  qd x5d. Patient to schedule f/u appt if no improvement by end of steroid course. Symptomatic management for other symptoms, with encouragement to remain hydrated.    Adin Hector, MD, MPH PGY-3 [facility] Pager [phone]

## 2017-05-29 NOTE — Patient Instructions (Signed)
It was nice meeting you today Ms. Donna Carlson!  Please begin taking prednisone one tablet with breakfast for the next 5 days. You can take the first tablet when you pick up the prescription this afternoon. Please continue to use your inhalers and nebulizer as you have been.   For your sore throat, you can use Cepachol throat lozenges or Chloraseptic throat spray. You can buy these from the drugstore. These have a numbing medicine in them that will help numb your throat. You can also take ibuprofen and/or Tylenol for the pain.   It is important to take plenty of water even if your throat is hurting to prevent dehydration.   If your symptoms are not getting any better by the time you finish the prednisone, please let us know.   If you have any questions or concerns, please feel free to call the clinic.   Be well,  Dr. Avon Gully

## 2017-05-29 NOTE — Assessment & Plan Note (Signed)
Likely 2/2 viral URI. Has been requiring albuterol q2 for past three days. Still with wheezing on auscultation today despite using albuterol two hours prior to appointment. Normal WOB on RA however with no signs of respiratory distress. As patient has done well with prednisone burst in past, will prescribe prednisone 50mg  qd x5d. Patient to schedule f/u appt if no improvement by end of steroid course. Symptomatic management for other symptoms, with encouragement to remain hydrated.

## 2017-06-28 ENCOUNTER — Encounter (HOSPITAL_COMMUNITY): Payer: Self-pay

## 2017-06-28 ENCOUNTER — Emergency Department (HOSPITAL_COMMUNITY): Payer: 59

## 2017-06-28 ENCOUNTER — Ambulatory Visit (INDEPENDENT_AMBULATORY_CARE_PROVIDER_SITE_OTHER): Payer: 59 | Admitting: Internal Medicine

## 2017-06-28 ENCOUNTER — Ambulatory Visit: Payer: 59

## 2017-06-28 ENCOUNTER — Other Ambulatory Visit: Payer: Self-pay

## 2017-06-28 ENCOUNTER — Encounter: Payer: Self-pay | Admitting: Internal Medicine

## 2017-06-28 ENCOUNTER — Emergency Department (HOSPITAL_COMMUNITY)
Admission: EM | Admit: 2017-06-28 | Discharge: 2017-06-28 | Disposition: A | Discharge status: Home / Self Care | Payer: 59 | Attending: Emergency Medicine | Admitting: Emergency Medicine

## 2017-06-28 VITALS — BP 118/80 | HR 99 | Temp 98.2°F | Wt 197.0 lb

## 2017-06-28 DIAGNOSIS — J4541 Moderate persistent asthma with (acute) exacerbation: Secondary | ICD-10-CM

## 2017-06-28 DIAGNOSIS — R0602 Shortness of breath: Secondary | ICD-10-CM | POA: Diagnosis not present

## 2017-06-28 DIAGNOSIS — Z79899 Other long term (current) drug therapy: Secondary | ICD-10-CM | POA: Insufficient documentation

## 2017-06-28 DIAGNOSIS — R05 Cough: Secondary | ICD-10-CM | POA: Diagnosis not present

## 2017-06-28 MED ORDER — SODIUM CHLORIDE 0.9 % IV BOLUS (SEPSIS)
1000.0000 mL | Freq: Once | INTRAVENOUS | Status: AC
  Administered 2017-06-28: 1000 mL via INTRAVENOUS

## 2017-06-28 MED ORDER — PREDNISONE 20 MG PO TABS: 40.0000 mg | ORAL_TABLET | Freq: Every day | ORAL | 0 refills | Status: AC

## 2017-06-28 MED ORDER — IPRATROPIUM-ALBUTEROL 0.5-2.5 (3) MG/3ML IN SOLN
3.0000 mL | Freq: Once | RESPIRATORY_TRACT | Status: AC
  Administered 2017-06-28: 3 mL via RESPIRATORY_TRACT
  Filled 2017-06-28: qty 3

## 2017-06-28 MED ORDER — ALBUTEROL SULFATE (2.5 MG/3ML) 0.083% IN NEBU
2.5000 mg | INHALATION_SOLUTION | Freq: Once | RESPIRATORY_TRACT | Status: AC
  Administered 2017-06-28: 2.5 mg via RESPIRATORY_TRACT

## 2017-06-28 MED ORDER — IPRATROPIUM BROMIDE 0.02 % IN SOLN
0.5000 mg | Freq: Once | RESPIRATORY_TRACT | Status: AC
  Administered 2017-06-28: 0.5 mg via RESPIRATORY_TRACT

## 2017-06-28 MED ORDER — IPRATROPIUM-ALBUTEROL 0.5-2.5 (3) MG/3ML IN SOLN: 3.0000 mL | Freq: Once | RESPIRATORY_TRACT | Status: DC

## 2017-06-28 MED ORDER — ALBUTEROL SULFATE (2.5 MG/3ML) 0.083% IN NEBU
2.5000 mg | INHALATION_SOLUTION | Freq: Once | RESPIRATORY_TRACT | Status: AC
  Administered 2017-06-28: 2.5 mg via RESPIRATORY_TRACT
  Filled 2017-06-28: qty 3

## 2017-06-28 MED ORDER — PREDNISONE 20 MG PO TABS
40.0000 mg | ORAL_TABLET | Freq: Once | ORAL | Status: AC
  Administered 2017-06-28: 40 mg via ORAL
  Filled 2017-06-28: qty 2

## 2017-06-28 MED FILL — ALBUTEROL 0.083% INHAL SOLN: (2.5 MG/3ML | 4 days supply | Qty: 75 | Fill #0

## 2017-06-28 MED FILL — VENTOLIN HFA 90 MCG INHALER: 108 (90 BAS | 25 days supply | Qty: 18 | Fill #2

## 2017-06-28 MED FILL — MONTELUKAST SOD 10 MG TAB: 10 | 30 days supply | Qty: 30 | Fill #2

## 2017-06-28 MED FILL — ADVAIR 500/50 DISKUS: 500-50 | 30 days supply | Qty: 60 | Fill #2

## 2017-06-28 NOTE — ED Triage Notes (Signed)
Patient complains of increased congestion, cough with green mucus since Thursday, states that she thinks this has exacerbated her asthma, took albuterol nebs x 2 pta. Hyperventilating on arrival, sats 95%

## 2017-06-28 NOTE — ED Provider Notes (Signed)
[facility] EMERGENCY DEPARTMENT Provider Note   CSN: [id number] Arrival date & time: 06/28/17  1446     History   Chief Complaint No chief complaint on file.   HPI Donna Carlson is a 41 y.o. female.  HPI   Donna Carlson is a 41 year old female with a history of obesity, asthma, allergic rhinitis presents emergency department for evaluation of shortness of breath and "I think I'm having an asthma exacerbation."  Patient states that she began feeling short of breath last night, also has associated congestion, cough with green mucus.  States that she was using her albuterol inhaler every hour overnight.  Went to her PCP office today where she received 2 DuoNeb treatments without significant relief and was sent to the ER.  At this time she states she feels short of breath, chest tightness and has wheezing.  She normally takes albuterol as needed, Advair daily and Singulair daily for her asthma.  Reports that her last asthma exacerbation was years ago.  Reports reduced p.o. fluid intake due to feeling unwell.  She denies fever, chest pain, leg swelling, hemoptysis, body aches, headache, ear pain. She denies sick contacts at home. She denies history of DVT or PE, no recent surgery or prolonged immobilization.   Past Medical History:  Diagnosis Date  . Asthma   . Palpitations     Patient Active Problem List   Diagnosis Date Noted  . Allergic rhinitis 06/04/2015  . Generalized weakness 07/10/2013  . Venous insufficiency of leg 07/02/2013  . DYSFUNCTIONAL UTERINE BLEEDING 04/10/2008  . Obesity 01/02/2008  . ACNE, CYSTIC 04/29/2007  . Asthma with exacerbation 11/19/2006  . Asthma, persistent 10/18/2006    Past Surgical History:  Procedure Laterality Date  . GALLBLADDER SURGERY    . TUBAL LIGATION      OB History    No data available       Home Medications    Prior to Admission medications   Medication Sig Start Date End Date Taking? Authorizing  Provider  acetaminophen (TYLENOL) 500 MG tablet Take 500 mg by mouth every 6 (six) hours as needed for mild pain.    [provider]  ADVAIR DISKUS 500-50 MCG/DOSE AEPB INHALE 1 PUFF INTO THE LUNGS 2 TIMES DAILY. 07/28/16   Vivi Barrack, MD  albuterol (PROVENTIL) (2.5 MG/3ML) 0.083% nebulizer solution Take 3 mLs (2.5 mg total) by nebulization every 4 (four) hours as needed for wheezing or shortness of breath. 09/20/16   Vivi Barrack, MD  albuterol (VENTOLIN HFA) 108 (90 Base) MCG/ACT inhaler INHALE 2 PUFFS BY MOUTH 2 TIMES DAILY AS NEEDED FOR WHEEZING OR SHORTNESS OF BREATH 09/20/16   Vivi Barrack, MD  chlorhexidine (HIBICLENS) 4 % external liquid Apply topically daily as needed. Dilute 10-15 mL in water, Use daily when bathing for 1-2 weeks 02/05/16   Melynda Ripple, MD  loratadine (CLARITIN) 10 MG tablet Take 1 tablet (10 mg total) by mouth daily. 06/04/15   Virginia Crews, MD  montelukast (SINGULAIR) 10 MG tablet Take 1 tablet (10 mg total) by mouth at bedtime. 09/20/16   Vivi Barrack, MD  predniSONE (DELTASONE) 20 MG tablet Take 2 tablets (40 mg total) daily for 4 days by mouth. 06/28/17 07/02/17  Glyn Ade, PA-C  predniSONE (DELTASONE) 50 MG tablet Take 1 tablet (50 mg total) by mouth daily with breakfast. 05/29/17   Verner Mould, MD    Family History Family History  Problem Relation Age of Onset  .  Hypertension Mother   . Heart attack Paternal Grandfather   . Hypertension Maternal Grandmother     Social History Social History   Tobacco Use  . Smoking status: Never Smoker  . Smokeless tobacco: Never Used  Substance Use Topics  . Alcohol use: Yes  . Drug use: No     Allergies   Shellfish allergy and Iodine   Review of Systems Review of Systems  Constitutional: Negative for chills, fatigue and fever.  HENT: Positive for congestion. Negative for ear pain, hearing loss, sore throat and trouble swallowing.   Respiratory: Positive  for cough, shortness of breath and wheezing.   Cardiovascular: Negative for chest pain and leg swelling.  Musculoskeletal: Negative for myalgias.  Neurological: Negative for headaches.  All other systems reviewed and are negative.    Physical Exam Updated Vital Signs BP (!) 133/105 (BP Location: Right Arm)   Pulse (!) 106   Temp 99.4 F (37.4 C) (Oral)   Resp 16   LMP 06/04/2017   SpO2 97%   Physical Exam  Constitutional: She appears well-developed and well-nourished. No distress.  HENT:  Head: Normocephalic and atraumatic.  Bilateral TMs with good cone light.  Boggy nasal turbinates and clear rhinorrhea in the nasal cavity.  Mucous membranes moist.  No oropharyngeal erythema or tonsillar exudate.  Uvula midline.  No trismus.  Airway patent.  Eyes: Conjunctivae are normal. Pupils are equal, round, and reactive to light. Right eye exhibits no discharge. Left eye exhibits no discharge.  Neck: Normal range of motion. Neck supple.  Cardiovascular: Intact distal pulses. Exam reveals no friction rub.  No murmur heard. Regular rhythm, tachycardic  Pulmonary/Chest: Effort normal and breath sounds normal. No respiratory distress.  No respiratory distress. No intercostal retractions or extra-abdominal muscle use for breathing. Good air movement. She has diffuse expiratory wheezes in the bilateral upper and lower lung fields. No rales or stridor.   Abdominal: Soft. Bowel sounds are normal. She exhibits no distension. There is no tenderness. There is no guarding.  Neurological: She is alert. Coordination normal.  Skin: Skin is warm and dry. Capillary refill takes less than 2 seconds. She is not diaphoretic.  Psychiatric: She has a normal mood and affect. Her behavior is normal.  Nursing note and vitals reviewed.    ED Treatments / Results  Labs (all labs ordered are listed, but only abnormal results are displayed) Labs Reviewed - No data to display  EKG  EKG Interpretation None         Radiology Dg Chest 2 View  Result Date: 06/28/2017 CLINICAL DATA:  Shortness of breath with cough and congestion EXAM: CHEST  2 VIEW COMPARISON:  June 04, 2015 FINDINGS: There is no edema or consolidation. The heart size and pulmonary vascularity are normal. No pneumothorax. No adenopathy. No bone lesions. IMPRESSION: No edema or consolidation. Electronically Signed   By: Lowella Grip III M.D.   On: 06/28/2017 15:29    Procedures Procedures (including critical care time)  Medications Ordered in ED Medications  ipratropium-albuterol (DUONEB) 0.5-2.5 (3) MG/3ML nebulizer solution 3 mL (3 mLs Nebulization Given 06/28/17 1746)  sodium chloride 0.9 % bolus 1,000 mL (0 mLs Intravenous Stopped 06/28/17 1943)  albuterol (PROVENTIL) (2.5 MG/3ML) 0.083% nebulizer solution 2.5 mg (2.5 mg Nebulization Given 06/28/17 1902)  predniSONE (DELTASONE) tablet 40 mg (40 mg Oral Given 06/28/17 1902)     Initial Impression / Assessment and Plan / ED Course  I have reviewed the triage vital signs and the nursing notes.  Pertinent labs & imaging results that were available during my care of the patient were reviewed by me and considered in my medical decision making (see chart for details).  Clinical Course as of Jun 28 2026  Thu Jun 28, 2017  7902 Patient states she feels "a little bit better" after one duoneb treatment in the Ed. No respiratory distress, RR 18 and pulse ox 100% on Ra. Air movement good, mild expiratory wheeze heard in left upper lung field.   [ES]  2018 Patient states that she is ready to go home. States she feels improved with nebulizer treatments. Lungs with mild expiratory wheezing in upper lung fields.  [ES]    Clinical Course User Index [ES] Glyn Ade, PA-C    Patient presents with asthma exacerbation.  Chest x-ray reveals no consolidation concerning for pneumonia.  Suspect her current exacerbation is due to upper respiratory tract infection, given history of  congestion.  Her pulse was mildly elevated at 107, suspect this is related to albuterol treatments.  She was given 1 L fluid bolus for tachycardia and history of decreased po fluid intake.  Patient ambulated in ED with O2 saturations maintained >90, no current signs of respiratory distress. Lung exam improved after nebulizer treatment. Prednisone given in the ED and pt will be discharged with burst. Pt has been instructed to continue using prescribed medications and to speak with PCP about today's exacerbation.  Discussed return precautions.  Patient agrees with plan and voices understanding.  Discussed this patient with Dr. Eulis Foster who agrees with plan to discharge.  Final Clinical Impressions(s) / ED Diagnoses   Final diagnoses:  Moderate persistent asthma with exacerbation    ED Discharge Orders        Ordered    predniSONE (DELTASONE) 20 MG tablet  Daily     06/28/17 2026       Glyn Ade, PA-C 06/29/17 0133    Daleen Bo, MD 07/07/17 [phone]

## 2017-06-28 NOTE — ED Triage Notes (Signed)
Pt reports the first nebb treatment  Was at home . Pt then went to Her PCP and received 2 dou-neb treatments with out relief . Pt sent to ED from PCP office.

## 2017-06-28 NOTE — Discharge Instructions (Signed)
Your chest x-ray was normal, no pneumonia.  I prescribed you prednisone.  Please take this for the next 4 days for your asthma exacerbation. Continue to take albuterol as needed for wheezing and chest tightness. Please continue to take advair and singulair.   Please drink plenty of fluids.  I have written you a work note should you need it.   Schedule appoint with your primary care doctor if her symptoms are not improving.  Please return to the ER if you have shortness of breath that does not improve with nebulizer at home, if you have fever greater than 100.4 degrees Fahrenheit with cough or for any new or worsening symptoms.

## 2017-06-28 NOTE — Progress Notes (Signed)
   [facility] Phone: [phone]  Subjective:  Donna Carlson is a 41 year old female presenting to clinic with shortness of breath. She thinks she is having an asthma exacerbation. Her shortness of breath started last night. She has been using albuterol every 1-2 hours. She doesn't feel like the medication is helping. She doesn't feel like she can take a deep enough breath to get the medication down into her lungs. She has tried albuterol when necessary inhaler and nebulizer. She also notes that she has been coughing up dark green sputum for the last 2 days. She has been sick for the last week. She had a fever to 104F last week, but has been afebrile since then. She has also been having muscle aches over the last week and has been more tired than normal. She has had 0 hospitalizations for asthma in the last year. She has never had an asthma exacerbation requiring ICU stay or intubation.  ROS: See HPI for pertinent positives and negatives  Past Medical History- persistent asthma, obesity  Family history reviewed for today's visit. No changes.  Social history- patient is a never smoker  Objective: BP 118/80   Pulse 99   Temp 98.2 F (36.8 C) (Oral)   Wt 197 lb (89.4 kg)   LMP 06/04/2017   SpO2 91%   BMI 33.81 kg/m  Gen: NAD, alert, cooperative with exam Resp: Mild to moderate increased work of breathing, becomes short of breath at the end of sentences, decreased air movement throughout, mild expiratory wheezing; after duoneb, still with decreased air movement throughout.  Assessment/Plan: Asthma Exacerbation: Patient with shortness of breath since last night. Albuterol not helping. O2 97% on RA in clinic, but drops to 91% when she talks. Duoneb x 2 given in clinic with no relief of symptoms. Patient taken to the ED in a wheelchair for further management.   Hyman Bible, MD PGY-3

## 2017-06-28 NOTE — Assessment & Plan Note (Signed)
Patient with shortness of breath since last night. Albuterol not helping. O2 97% on RA in clinic, but drops to 91% when she talks. Duoneb x 2 given in clinic with no relief of symptoms. Patient taken to the ED in a wheelchair for further management.

## 2017-06-29 MED FILL — predniSONE 20 MG TABS: 20 | 4 days supply | Qty: 8 | Fill #0

## 2017-10-24 ENCOUNTER — Ambulatory Visit (INDEPENDENT_AMBULATORY_CARE_PROVIDER_SITE_OTHER): Payer: 59 | Admitting: Internal Medicine

## 2017-10-24 ENCOUNTER — Other Ambulatory Visit (HOSPITAL_COMMUNITY)
Admission: RE | Admit: 2017-10-24 | Discharge: 2017-10-24 | Disposition: A | Discharge status: Home / Self Care | Payer: 59 | Source: Ambulatory Visit | Attending: Family Medicine | Admitting: Family Medicine

## 2017-10-24 ENCOUNTER — Ambulatory Visit: Payer: Self-pay | Admitting: Internal Medicine

## 2017-10-24 ENCOUNTER — Other Ambulatory Visit: Payer: Self-pay

## 2017-10-24 ENCOUNTER — Encounter: Payer: Self-pay | Admitting: Internal Medicine

## 2017-10-24 DIAGNOSIS — N898 Other specified noninflammatory disorders of vagina: Secondary | ICD-10-CM | POA: Insufficient documentation

## 2017-10-24 LAB — POCT WET PREP (WET MOUNT): CLUE CELLS WET PREP WHIFF POC: NEGATIVE

## 2017-10-24 MED ORDER — METRONIDAZOLE 500 MG PO TABS: 500.0000 mg | ORAL_TABLET | Freq: Two times a day (BID) | ORAL | 0 refills | Status: DC

## 2017-10-24 MED ORDER — VALACYCLOVIR HCL 500 MG PO TABS: 500.0000 mg | ORAL_TABLET | Freq: Two times a day (BID) | ORAL | 0 refills | Status: DC

## 2017-10-24 MED FILL — VALACYCLOVIR HCL 500 MG TAB: 500 | 7 days supply | Qty: 14 | Fill #0

## 2017-10-24 MED FILL — metroNIDAZOLE 500 MG TABS: 500 | 7 days supply | Qty: 14 | Fill #0

## 2017-10-24 NOTE — Patient Instructions (Addendum)
It was nice seeing you today Ms. Donna Carlson!  For trichomonas, please begin taking one tablet of Flagyl two times a day for the next 7 days. If your symptoms do not improve by the time you have finished taking the medication, please let us know. I will call you when the results of your other testing are available.   Please use only soap and water to clean your vaginal area. Bodywashes, even specific washes like Vagisil, can alter your vaginal pH and cause irritation and discharge. Use an unscented mild soap like Dove, and only clean the external area.   If you have any questions or concerns, please feel free to call the clinic.   Be well,  Dr. Avon Gully

## 2017-10-24 NOTE — Progress Notes (Signed)
   Subjective:   Patient: Donna Carlson       Birthdate: 1976-02-03       MRN: [id number]      HPI  Donna Carlson is a 42 y.o. female presenting for same day appt for vaginal irritation.   Vaginal irritation Patient recently started using Vagisil bodywash, and since then has noted vaginal itching and odor. Began using bodywash one week ago and vaginal irritation started yesterday. Denies vaginal discharge. Denies dysuria, hematuria. Denies abd pain, N/V. LMP two weeks ago. Is sexually active and does not use condoms. Would like to be checked for STDs today.   Smoking status reviewed. Patient is never smoker.   Review of Systems See HPI.     Objective:  Physical Exam  Constitutional: She is oriented to person, place, and time and well-developed, well-nourished, and in no distress.  HENT:  Head: Normocephalic and atraumatic.  Pulmonary/Chest: Effort normal. No respiratory distress.  Abdominal: Soft. Bowel sounds are normal. She exhibits no distension. There is no tenderness.  Genitourinary: Vagina normal and cervix normal.  Genitourinary Comments: Moderate while mucinous discharge  Neurological: She is alert and oriented to person, place, and time.  Skin: Skin is warm and dry.  Psychiatric: Affect and judgment normal.   Assessment & Plan:  Vaginal irritation Likely trich, given positive wet prep, however Vagisil bathwash could be contributing to irritation as well. Will begin Flagyl 500mg  BID x7d for trich treatment. Encouraged against using bath washes and recommended regular soap and water instead. Will call patient with results of GC/chlamydia when available.    Adin Hector, MD, MPH PGY-3 [facility] Pager [phone]

## 2017-10-24 NOTE — Assessment & Plan Note (Addendum)
Likely trich, given positive wet prep, however Vagisil bathwash could be contributing to irritation as well. Will begin Flagyl 500mg  BID x7d for trich treatment. Encouraged against using bath washes and recommended regular soap and water instead. Will call patient with results of GC/chlamydia when available.

## 2017-10-25 LAB — CERVICOVAGINAL ANCILLARY ONLY
Chlamydia: NEGATIVE
Neisseria Gonorrhea: NEGATIVE

## 2017-10-26 ENCOUNTER — Telehealth: Payer: Self-pay | Admitting: Internal Medicine

## 2017-10-26 NOTE — Telephone Encounter (Signed)
Called to inform patient of neg GC/chlamydia. Patient appreciative.   Adin Hector, MD, MPH PGY-3 [facility] Pager [phone]

## 2017-11-26 ENCOUNTER — Ambulatory Visit (INDEPENDENT_AMBULATORY_CARE_PROVIDER_SITE_OTHER): Payer: 59 | Admitting: Internal Medicine

## 2017-11-26 ENCOUNTER — Encounter: Payer: Self-pay | Admitting: Internal Medicine

## 2017-11-26 ENCOUNTER — Other Ambulatory Visit: Payer: Self-pay

## 2017-11-26 DIAGNOSIS — J45909 Unspecified asthma, uncomplicated: Secondary | ICD-10-CM | POA: Diagnosis not present

## 2017-11-26 DIAGNOSIS — J4531 Mild persistent asthma with (acute) exacerbation: Secondary | ICD-10-CM | POA: Diagnosis not present

## 2017-11-26 MED ORDER — PREDNISONE 20 MG PO TABS: ORAL_TABLET | ORAL | 0 refills | Status: DC

## 2017-11-26 MED ORDER — ALBUTEROL SULFATE (2.5 MG/3ML) 0.083% IN NEBU: 2.5000 mg | INHALATION_SOLUTION | RESPIRATORY_TRACT | 1 refills | Status: DC | PRN

## 2017-11-26 MED ORDER — MONTELUKAST SODIUM 10 MG PO TABS: 10.0000 mg | ORAL_TABLET | Freq: Every day | ORAL | 3 refills | Status: DC

## 2017-11-26 MED FILL — MONTELUKAST SOD 10 MG TAB: 10 | 90 days supply | Qty: 90 | Fill #0

## 2017-11-26 MED FILL — predniSONE 20 MG TABS: 20 | 7 days supply | Qty: 13 | Fill #0

## 2017-11-26 MED FILL — ALBUTEROL 0.083% INHAL SOLN: (2.5 MG/3ML | 4 days supply | Qty: 75 | Fill #0

## 2017-11-26 NOTE — Progress Notes (Signed)
   Subjective:   Patient: Donna Carlson       Birthdate: 25-Nov-1975       MRN: [id number]      HPI  Oliver HARPREET SIGNORE is a 42 y.o. female presenting for asthma exacerbation.   Asthma exacerbation Began four days ago, but worsened yesterday. Has history of allergies which worsened about four days ago as well. Has been taking all of her maintenance meds, including Advair, Singulair, and Claritin. Has been using albuterol inhaler q2 since yesterday, and used albuterol neb a couple times last night. Last inhaler use 5 hours ago. Endorses productive cough. Endorses watery and itchy eyes. Endorses rhinorrhea. Denies fever, sore throat.   Smoking status reviewed. Patient is never smoker.   Review of Systems See HPI.     Objective:  Physical Exam  Constitutional: She is oriented to person, place, and time. She appears well-developed and well-nourished. No distress.  HENT:  Head: Normocephalic.  Sniffling throughout encounter. Clear nasal discharge. Mild oropharyngeal erythema, no exudates. MMM.   Eyes: Pupils are equal, round, and reactive to light. Conjunctivae and EOM are normal. Right eye exhibits no discharge. Left eye exhibits no discharge.  Cardiovascular: Normal rate, regular rhythm and normal heart sounds.  No murmur heard. Pulmonary/Chest:  Normal WOB on RA. Able to speak in full sentences. Lungs CTAB without wheezing or rhonchi. Good air movement.   Neurological: She is alert and oriented to person, place, and time.  Skin: Skin is warm and dry.  Psychiatric: She has a normal mood and affect. Her behavior is normal.      Assessment & Plan:  Asthma, mild persistent Current exacerbation. Likely allergic component, however given productive cough with oropharyngeal erythema, may have viral URI as well. No wheezing on exam with last inhaler use ~5 hrs prior, normal WOB on RA and O2 sat 99% on RA, so will not administer neb treatment in office today. As sx only began 4d ago and  patient has not had fevers, and also as no rhonchi on exam, will not treat with abx at this time. Will begin prednisone taper for asthma exacerbation. Also encouraged to continue taking maintenance meds, as well as albuterol inhaler and neb as needed. If not beginning to improve after 2-3 days of prednisone, patient to call and schedule appt. Strict return precautions discussed.     Adin Hector, MD, MPH PGY-3 [facility] Pager [phone]

## 2017-11-26 NOTE — Assessment & Plan Note (Signed)
Current exacerbation. Likely allergic component, however given productive cough with oropharyngeal erythema, may have viral URI as well. No wheezing on exam with last inhaler use ~5 hrs prior, normal WOB on RA and O2 sat 99% on RA, so will not administer neb treatment in office today. As sx only began 4d ago and patient has not had fevers, and also as no rhonchi on exam, will not treat with abx at this time. Will begin prednisone taper for asthma exacerbation. Also encouraged to continue taking maintenance meds, as well as albuterol inhaler and neb as needed. If not beginning to improve after 2-3 days of prednisone, patient to call and schedule appt. Strict return precautions discussed.

## 2017-11-26 NOTE — Patient Instructions (Signed)
It was nice seeing you again today Ms. Donna Carlson!  Please begin taking prednisone. Be sure to complete the full course. You can continue to use your albuterol inhaler and nebulizer as needed. If you are not getting better after 2-3 days of prednisone, please call our office.   You can go to [facility] or any other medical supply store to get a new mouthpiece for your nebulizer. If you have any issues, please call to let us know.   If your breathing severely worsens, please call 911 or go to the emergency room right away.   If you have any questions or concerns, please feel free to call the clinic.   Be well,  Dr. Avon Gully

## 2018-02-14 ENCOUNTER — Ambulatory Visit (INDEPENDENT_AMBULATORY_CARE_PROVIDER_SITE_OTHER): Payer: Self-pay | Admitting: Family Medicine

## 2018-02-14 VITALS — BP 125/85 | HR 77 | Temp 98.8°F | Resp 16 | Ht 63.5 in | Wt 195.8 lb

## 2018-02-14 DIAGNOSIS — Z0000 Encounter for general adult medical examination without abnormal findings: Secondary | ICD-10-CM

## 2018-02-14 NOTE — Progress Notes (Signed)
Donna Carlson is a 42 y.o. female who presents today with concerns of need for an annual physical exam. She reports that she is under the care of a PCP and only has asthma as a chronic health condition. She denies any acute concerns today.  Review of Systems  Constitutional: Negative for chills, fever and malaise/fatigue.  HENT: Negative for congestion, ear discharge, ear pain, sinus pain and sore throat.   Eyes: Negative.   Respiratory: Negative for cough, sputum production and shortness of breath.   Cardiovascular: Negative.  Negative for chest pain.  Gastrointestinal: Negative for abdominal pain, diarrhea, nausea and vomiting.  Genitourinary: Negative for dysuria, frequency, hematuria and urgency.  Musculoskeletal: Negative for myalgias.  Skin: Negative.   Neurological: Negative for headaches.  Endo/Heme/Allergies: Negative.   Psychiatric/Behavioral: Negative.     O: Vitals:   02/14/18 1752  BP: 125/85  Pulse: 77  Resp: 16  Temp: 98.8 F (37.1 C)  SpO2: 98%     Physical Exam  Constitutional: She is oriented to person, place, and time. Vital signs are normal. She appears well-developed and well-nourished. She is active.  Non-toxic appearance. She does not have a sickly appearance.  HENT:  Head: Normocephalic.  Right Ear: Hearing, tympanic membrane, external ear and ear canal normal.  Left Ear: Hearing, tympanic membrane, external ear and ear canal normal.  Nose: Nose normal.  Mouth/Throat: Uvula is midline and oropharynx is clear and moist.  Neck: Normal range of motion. Neck supple.  Cardiovascular: Normal rate, regular rhythm, normal heart sounds and normal pulses.  Pulmonary/Chest: Effort normal and breath sounds normal.  Abdominal: Soft. Bowel sounds are normal.  Musculoskeletal: Normal range of motion.  Lymphadenopathy:       Head (right side): No submental and no submandibular adenopathy present.       Head (left side): No submental and no submandibular  adenopathy present.    She has no cervical adenopathy.  Neurological: She is alert and oriented to person, place, and time.  Psychiatric: She has a normal mood and affect.  PHQ-9- negative  Vitals reviewed.   A: 1. Physical exam    P: Exam findings, diagnosis etiology and medication use and indications reviewed with patient. Follow- Up and discharge instructions provided. No emergent/urgent issues found on exam.  Patient verbalized understanding of information provided and agrees with plan of care (POC), all questions answered.  1. Physical exam WNL

## 2018-02-14 NOTE — Patient Instructions (Signed)

## 2018-03-13 ENCOUNTER — Telehealth: Payer: Self-pay | Admitting: *Deleted

## 2018-03-13 NOTE — Telephone Encounter (Signed)
Pt nebulizer is no longer working, she needs a Rx for a new one so that she can take it to [facility].   Will forward to MD. Trixy Loyola, Salome Spotted, [place]

## 2018-03-21 ENCOUNTER — Other Ambulatory Visit: Payer: Self-pay | Admitting: Family Medicine

## 2018-03-21 DIAGNOSIS — J45909 Unspecified asthma, uncomplicated: Secondary | ICD-10-CM

## 2018-03-28 NOTE — Telephone Encounter (Signed)
Community message sent to [employer] with [facility] letting her know that this order was placed. Donna Carlson,CMA

## 2018-03-28 NOTE — Telephone Encounter (Signed)
Placed order for [facility] machine. It can be found under the "Other Orders" tab. Please send to [facility] as requested by pt.    Matilde Haymaker, MD

## 2018-04-04 ENCOUNTER — Other Ambulatory Visit: Payer: Self-pay | Admitting: Family Medicine

## 2018-04-04 DIAGNOSIS — J069 Acute upper respiratory infection, unspecified: Secondary | ICD-10-CM

## 2018-04-05 ENCOUNTER — Other Ambulatory Visit: Payer: Self-pay | Admitting: Family Medicine

## 2018-04-05 DIAGNOSIS — J069 Acute upper respiratory infection, unspecified: Secondary | ICD-10-CM

## 2018-04-05 NOTE — Telephone Encounter (Signed)
Pharmacy has been sending to incorrect doctor. Please fill as quickly as possible.   Danley Danker, RN [facility] RN)

## 2018-04-08 MED FILL — VENTOLIN HFA 90 MCG INHALER: 108 (90 BAS | 50 days supply | Qty: 18 | Fill #0

## 2018-04-08 MED FILL — ADVAIR 500/50 DISKUS: 500-50 | 30 days supply | Qty: 60 | Fill #0

## 2018-04-20 ENCOUNTER — Encounter: Payer: Self-pay | Admitting: Nurse Practitioner

## 2018-04-20 ENCOUNTER — Ambulatory Visit (INDEPENDENT_AMBULATORY_CARE_PROVIDER_SITE_OTHER): Payer: Self-pay | Admitting: Nurse Practitioner

## 2018-04-20 VITALS — BP 128/88 | HR 73 | Resp 16 | Ht 63.0 in | Wt 197.2 lb

## 2018-04-20 DIAGNOSIS — T148XXA Other injury of unspecified body region, initial encounter: Secondary | ICD-10-CM

## 2018-04-20 MED ORDER — MUPIROCIN 2 % EX OINT: 1.0000 "application " | TOPICAL_OINTMENT | Freq: Two times a day (BID) | CUTANEOUS | 0 refills | Status: AC

## 2018-04-20 NOTE — Progress Notes (Signed)
   Subjective:    Patient ID: Donna Carlson, female    DOB: [birth year], 42 y.o.   MRN: [id number]  The patient is a 42 year old female who presents today for complaints of a skin tear to her left buttock.  The patient states it happened on 8/26, after she and her boyfriend were having sex.  The patient states she had sex for about 5 hours, and noticed that the area was "sore" and irritated.  The patient states that she had a history of a skin abscess to that area that was mistreated.  Patient was concerned that this was an exacerbation of that area.  The patient denies pain at this time, along with fever, chills, itching, pain, and swelling to that area.   Viewed the patient's past medical history, current medications, and allergies.   Review of Systems  Constitutional: Negative.   HENT: Negative.   Respiratory: Negative.   Cardiovascular: Negative.   Skin: Positive for wound (left buttock).       Objective:   Physical Exam  Constitutional: She appears well-developed and well-nourished. No distress.  Eyes: Pupils are equal, round, and reactive to light. EOM are normal.  Neck: Normal range of motion. Neck supple. No tracheal deviation present. No thyromegaly present.  Cardiovascular: Normal rate, regular rhythm and normal heart sounds.  Pulmonary/Chest: Effort normal and breath sounds normal.  Skin: Skin is warm and dry. No rash (left buttock, area there is approximately 1 cm in diameter.  Pale whitish appearance which is evidence of new epithelial tissue and healing.  No drainage, mild erythema at the base, no pain with palpation.) noted.          Assessment & Plan:  Skin Abrasion  Exam findings, diagnosis etiology and medication use and indications reviewed with patient. Follow- Up and discharge instructions provided. No emergent/urgent issues found on exam.  Patient education was provided. Patient verbalized understanding of information provided and agrees with plan of care  (POC), all questions answered. The patient is advised to call or return to clinic if he does not see an improvement in symptoms, or to seek the care of the closest emergency department if condition worsens with the above plan.   1. Abrasion of skin - mupirocin ointment (BACTROBAN) 2 %; Apply 1 application topically 2 (two) times daily for 10 days.  Dispense: 22 g; Refill: 0 -Use Hibiclens soap to clean the area twice daily until symptoms improve. -Use ibuprofen or Tylenol for pain, fever, or general discomfort. -Avoid any further irritation to the area until symptoms are resolved. -Follow-up as needed.

## 2018-04-20 NOTE — Patient Instructions (Signed)
Abrasion -Apply mupirocin ointment twice daily for 10 days or until symptoms improve. -Use Hibiclens soap to clean the area twice daily until symptoms improve. -Use ibuprofen or Tylenol for pain, fever, or general discomfort. -Avoid any further irritation to the area until symptoms are resolved. -Follow-up as needed.   -An abrasion is a cut or scrape on the outer surface of your skin. An abrasion does not extend through all of the layers of your skin. It is important to care for your abrasion properly to prevent infection. What are the causes? Most abrasions are caused by falling on or gliding across the ground or another surface. When your skin rubs on something, the outer and inner layer of skin rubs off. What are the signs or symptoms? A cut or scrape is the main symptom of this condition. The scrape may be bleeding, or it may appear red or pink. If there was an associated fall, there may be an underlying bruise. How is this diagnosed? An abrasion is diagnosed with a physical exam. How is this treated? Treatment for this condition depends on how large and deep the abrasion is. Usually, your abrasion will be cleaned with water and mild soap. This removes any dirt or debris that may be stuck. An antibiotic ointment may be applied to the abrasion to help prevent infection. A bandage (dressing) may be placed on the abrasion to keep it clean. You may also need a tetanus shot. Follow these instructions at home: Medicines  Take or apply medicines only as directed by your health care provider.  If you were prescribed an antibiotic ointment, finish all of it even if you start to feel better. Wound care  Clean the wound with mild soap and water 2-3 times per day or as directed by your health care provider. Pat your wound dry with a clean towel. Do not rub it.  There are many different ways to close and cover a wound. Follow instructions from your health care provider about: ? Wound  care. ? Dressing changes and removal.  Check your wound every day for signs of infection. Watch for: ? Redness, swelling, or pain. ? Fluid, blood, or pus. General instructions   Keep the dressing dry as directed by your health care provider. Do not take baths, swim, use a hot tub, or do anything that would put your wound underwater until your health care provider approves.  If there is swelling, raise (elevate) the injured area above the level of your heart while you are sitting or lying down.  Keep all follow-up visits as directed by your health care provider. This is important. Contact a health care provider if:  You received a tetanus shot and you have swelling, severe pain, redness, or bleeding at the injection site.  Your pain is not controlled with medicine.  You have increased redness, swelling, or pain at the site of your wound. Get help right away if:  You have a red streak going away from your wound.  You have a fever.  You have fluid, blood, or pus coming from your wound.  You notice a bad smell coming from your wound or your dressing. This information is not intended to replace advice given to you by your health care provider. Make sure you discuss any questions you have with your health care provider. Document Released: 05/17/2005 Document Revised: 04/07/2016 Document Reviewed: 08/05/2014 Elsevier Interactive Patient Education  2017 [employer].

## 2018-05-28 MED FILL — VENTOLIN HFA 90 MCG INHALER: 108 (90 BAS | 50 days supply | Qty: 18 | Fill #1

## 2018-08-26 ENCOUNTER — Other Ambulatory Visit: Payer: Self-pay

## 2018-08-26 ENCOUNTER — Encounter: Payer: Self-pay | Admitting: Family Medicine

## 2018-08-26 ENCOUNTER — Ambulatory Visit (INDEPENDENT_AMBULATORY_CARE_PROVIDER_SITE_OTHER): Payer: 59 | Admitting: Family Medicine

## 2018-08-26 VITALS — BP 102/61 | HR 74 | Temp 99.2°F | Ht 63.0 in | Wt 208.8 lb

## 2018-08-26 DIAGNOSIS — J45909 Unspecified asthma, uncomplicated: Secondary | ICD-10-CM

## 2018-08-26 MED ORDER — MONTELUKAST SODIUM 10 MG PO TABS: 10.0000 mg | ORAL_TABLET | Freq: Every day | ORAL | 3 refills | Status: DC

## 2018-08-26 MED ORDER — FLUTICASONE-SALMETEROL 500-50 MCG/DOSE IN AEPB: 1.0000 | INHALATION_SPRAY | Freq: Two times a day (BID) | RESPIRATORY_TRACT | 5 refills | Status: DC

## 2018-08-26 MED ORDER — PREDNISONE 20 MG PO TABS: 40.0000 mg | ORAL_TABLET | Freq: Every day | ORAL | 0 refills | Status: DC

## 2018-08-26 MED FILL — ADVAIR 500/50 DISKUS: 500-50 | 30 days supply | Qty: 60 | Fill #0

## 2018-08-26 MED FILL — MONTELUKAST SOD 10 MG TAB: 10 | 90 days supply | Qty: 90 | Fill #0

## 2018-08-26 MED FILL — predniSONE 20 MG TABS: 20 | 5 days supply | Qty: 10 | Fill #0

## 2018-08-26 NOTE — Progress Notes (Signed)
  Patient Name: [name] Date of Birth: [birth year] Date of Visit: 08/26/18 PCP: Matilde Haymaker, MD  Chief Complaint: breathing issue, congestion   Subjective: Donna Carlson is a pleasant 43 y.o. year old medical history significant for asthma presenting today for shortness of breath, wheezing and cough.  The patient reports a 6-day history of increased albuterol use and wheezing.  She reports this is worse with exertion and at night.  She endorses some congestion and cough, worse than usual with the weather change.  She reports her typical asthma exacerbations are precipitated by weather changes.  She reports the warm weather last week in the cold weather this week has made her symptoms considerably worse.  She is using her albuterol every 4-6 hours.  She is having significant relief with this.  She last used albuterol about 2 hours ago.  She has had a 7 pound weight gain over the holidays.  She denies orthopnea, paroxysmal nocturnal dyspnea, lower extremity edema.  She denies recent travel.  She is not on oral contraceptives.  She has no history of pulmonary embolism. She specifically denies chest pain. Denies fevers, chills, headaches, vomiting. She is taking her controller inhaler (Advair) and Singulair (she is out of this).   ROS:  ROS As above.   I have reviewed the patient's medical, surgical, family, and social history as appropriate.   Vitals:   08/26/18 0932  BP: 102/61  Pulse: 74  Temp: 99.2 F (37.3 C)  SpO2: 99%   Filed Weights   08/26/18 0932  Weight: 208 lb 12.8 oz (94.7 kg)   HEENT: Sclera anicteric. Dentition is moderate. Appears well hydrated. Neck: Supple Cardiac: Regular rate and rhythm. Normal S1/S2. No murmurs, rubs, or gallops appreciated. Lungs: Moderate air movement bilaterally with expiratory wheezing throughout lower and upper lung fields. Abdomen: Normoactive bowel sounds. No tenderness to deep or light palpation. No rebound or guarding.    Extremities: Warm, well perfused without edema.  Skin: Warm, dry Psych: Pleasant and appropriate   Walking oxygen saturation 94%   [facility] was seen today for asthma.  Diagnoses and all orders for this visit:  Persistent asthma without complication, unspecified asthma severity, PERC score of 0, no risk factors for PE, wheezing on exam, most consistent with asthma. No JVP or edema concerning for heart failure.  -     montelukast (SINGULAIR) 10 MG tablet; Take 1 tablet (10 mg total) by mouth at bedtime. -     predniSONE (DELTASONE) 20 MG tablet; Take 2 tablets (40 mg total) by mouth daily with breakfast.) -     Fluticasone-Salmeterol (ADVAIR DISKUS) 500-50 MCG/DOSE AEPB; Inhale 1 puff into the lungs 2 (two) times daily. - Recommend PFT, consideration of sleep study at follow up. Reviewed strict precautions for return to care if symptoms do not improve.   Dorris Singh, MD  Family Medicine Teaching Service

## 2018-08-26 NOTE — Patient Instructions (Signed)
It was wonderful to see you today.  Thank you for choosing [facility].   Please call [phone] with any questions about today's appointment.  Please be sure to schedule follow up at the front  desk before you leave today.   Dorris Singh, MD  Family Medicine   Please schedule follow-up with your primary care doctor Dr. Pilar Plate at the front desk.  We should consider repeating your lung function testing in the future.  Please present to the emergency room if your breathing worsens, you develop chest pain or having to use her albuterol more than every 4-6 hours.  Call me if your symptoms do not improve in the next 1 to 2 days with use of the prednisone

## 2018-09-25 ENCOUNTER — Ambulatory Visit: Payer: 59 | Admitting: Family Medicine

## 2018-11-07 MED FILL — ALBUTEROL 0.083% INHAL SOLN: (2.5 MG/3ML | 4 days supply | Qty: 75 | Fill #1

## 2018-11-07 MED FILL — VENTOLIN HFA 90 MCG INHALER: 108 (90 BAS | 50 days supply | Qty: 18 | Fill #2

## 2018-11-07 MED FILL — ADVAIR 500/50 DISKUS: 500-50 | 30 days supply | Qty: 60 | Fill #1

## 2018-11-07 MED FILL — MONTELUKAST SOD 10 MG TAB: 10 | 90 days supply | Qty: 90 | Fill #1

## 2019-01-24 ENCOUNTER — Ambulatory Visit: Payer: 59 | Admitting: Family Medicine

## 2019-01-27 ENCOUNTER — Ambulatory Visit (INDEPENDENT_AMBULATORY_CARE_PROVIDER_SITE_OTHER): Payer: 59 | Admitting: Family Medicine

## 2019-01-27 ENCOUNTER — Other Ambulatory Visit: Payer: Self-pay

## 2019-01-27 ENCOUNTER — Encounter: Payer: Self-pay | Admitting: Family Medicine

## 2019-01-27 VITALS — BP 102/60 | HR 82 | Ht 63.0 in | Wt 204.2 lb

## 2019-01-27 DIAGNOSIS — S99922A Unspecified injury of left foot, initial encounter: Secondary | ICD-10-CM | POA: Diagnosis not present

## 2019-01-27 DIAGNOSIS — B353 Tinea pedis: Secondary | ICD-10-CM

## 2019-01-27 HISTORY — DX: Tinea pedis: B35.3

## 2019-01-27 HISTORY — DX: Unspecified injury of left foot, initial encounter: S99.922A

## 2019-01-27 MED ORDER — TERBINAFINE HCL 1 % EX CREA: 1.0000 "application " | TOPICAL_CREAM | Freq: Two times a day (BID) | CUTANEOUS | 0 refills | Status: DC

## 2019-01-27 NOTE — Assessment & Plan Note (Signed)
Point tenderness noted to head of fourth and fifth metatarsals without noted displacement following injury, will obtain left foot x-ray and give prescription for hard sole cast shoe.  Advised Tylenol and ibuprofen as needed for pain relief as well as ice packs for swelling.

## 2019-01-27 NOTE — Progress Notes (Signed)
  Subjective:   Patient ID: Donna Carlson    DOB: September 03, 1975, 43 y.o. female   MRN: [id number]  Donna Carlson is a 43 y.o. female with a history of mild persistent asthma, seasonal allergies, obesity here for   Toe injury Patient states she was rushing to get ready for work a few days ago and stubbed her toe on a chair.  She then noticed swelling and sharp pain of her fourth and fifth toes later that day.  She has been buddy taping her fourth and fifth toes with tape but notes the swelling is inhibiting wearing her regular shoes.  She has not tried any medication for pain.  Denies discharge, open fracture, redness.  Review of Systems:  Per HPI.  [place], medications and smoking status reviewed.  Objective:   BP 102/60   Pulse 82   Ht 5\' 3"  (1.6 m)   Wt 204 lb 4 oz (92.6 kg)   LMP 12/29/2018   SpO2 97%   BMI 36.18 kg/m  Vitals and nursing note reviewed.  General: Obese female, in no acute distress with non-toxic appearance Skin: warm, dry, no rashes or lesions MSK: warm and well perfused.  Localized swelling to left lateral foot as compared to right without erythema.  Head of fourth and fifth metatarsals tender to touch.  No displacement of toes noted, no open fractures.  Skin breakdown noted between fourth and fifth toes consistent with tinea pedis.  Neurovascularly intact.  Full AROM of toes though with some pain.  Antalgic gait. Neuro: Alert and oriented, speech normal  Assessment & Plan:   Toe injury, left, initial encounter Point tenderness noted to head of fourth and fifth metatarsals without noted displacement following injury, will obtain left foot x-ray and give prescription for hard sole cast shoe.  Advised Tylenol and ibuprofen as needed for pain relief as well as ice packs for swelling.  Tinea pedis Noted on exam between fourth and fifth toes, likely related to increased moisture from buddy taping.  Will provide terbinafine cream.  Instructed patient to keep area  clean and dry.  Given she will be given a prescription for hard sole cast shoe, she can buddy tape only for comfort if needed.  Orders Placed This Encounter  Procedures  . For home use only DME Other see comment    Hard sole cast shoe    Order Specific Question:   Length of Need    Answer:   6 Months  . DG Foot Complete Left    Standing Status:   Future    Standing Expiration Date:   03/28/2020    Order Specific Question:   Reason for Exam (SYMPTOM  OR DIAGNOSIS REQUIRED)    Answer:   foot injury and pain    Order Specific Question:   Is patient pregnant?    Answer:   No    Order Specific Question:   Preferred imaging location?    Answer:   [facility]    Order Specific Question:   Radiology Contrast Protocol - do NOT remove file path    Answer:   [email or web address]   Meds ordered this encounter  Medications  . terbinafine (LAMISIL) 1 % cream    Sig: Apply 1 application topically 2 (two) times daily.    Dispense:  30 g    Refill:  0    Rory Percy, DO PGY-2, [facility] 01/27/2019 4:09 PM

## 2019-01-27 NOTE — Assessment & Plan Note (Signed)
Noted on exam between fourth and fifth toes, likely related to increased moisture from buddy taping.  Will provide terbinafine cream.  Instructed patient to keep area clean and dry.  Given she will be given a prescription for hard sole cast shoe, she can buddy tape only for comfort if needed.

## 2019-01-27 NOTE — Patient Instructions (Addendum)
It was great to see you!  Our plans for today:  - We are providing a script for a hard sole cast shoe. You can take this to any medical supply store. - We are getting an xray of your foot. You do not need an appointment for this. Go to [facility] on [address] between 8-4pm. - Use the cream in between your toes twice daily for a week. - You can use tylenol and ibuprofen for pain. Using ice packs can help with the swelling. - You can buddy tape your toes for comfort.  Make an appointment soon to get updated on your pap smear.  Take care and seek immediate care sooner if you develop any concerns.   Dr. [facility]

## 2019-02-05 MED FILL — PENICILLIN VK 500 MG TABLET: 500 | 7 days supply | Qty: 28 | Fill #0

## 2019-02-05 MED FILL — HYDROCODON-APAP 5-325: 5-325 | 3 days supply | Qty: 16 | Fill #0

## 2019-02-18 DIAGNOSIS — L700 Acne vulgaris: Secondary | ICD-10-CM | POA: Diagnosis not present

## 2019-02-18 MED FILL — SPIRONOLACTONE 100 MG TAB: 100 | 30 days supply | Qty: 30 | Fill #0

## 2019-04-01 ENCOUNTER — Other Ambulatory Visit: Payer: Self-pay

## 2019-04-01 ENCOUNTER — Telehealth (INDEPENDENT_AMBULATORY_CARE_PROVIDER_SITE_OTHER): Payer: 59 | Admitting: Family Medicine

## 2019-04-01 ENCOUNTER — Encounter: Payer: Self-pay | Admitting: Family Medicine

## 2019-04-01 VITALS — Wt 200.0 lb

## 2019-04-01 DIAGNOSIS — J4531 Mild persistent asthma with (acute) exacerbation: Secondary | ICD-10-CM

## 2019-04-01 DIAGNOSIS — J45909 Unspecified asthma, uncomplicated: Secondary | ICD-10-CM

## 2019-04-01 MED ORDER — MONTELUKAST SODIUM 10 MG PO TABS: 10.0000 mg | ORAL_TABLET | Freq: Every day | ORAL | 3 refills | Status: DC

## 2019-04-01 MED ORDER — FLUTICASONE-SALMETEROL 500-50 MCG/DOSE IN AEPB: 1.0000 | INHALATION_SPRAY | Freq: Two times a day (BID) | RESPIRATORY_TRACT | 5 refills | Status: DC

## 2019-04-01 MED ORDER — ALBUTEROL SULFATE HFA 108 (90 BASE) MCG/ACT IN AERS: INHALATION_SPRAY | RESPIRATORY_TRACT | 3 refills | Status: DC

## 2019-04-01 MED ORDER — ALBUTEROL SULFATE (2.5 MG/3ML) 0.083% IN NEBU: 2.5000 mg | INHALATION_SOLUTION | RESPIRATORY_TRACT | 1 refills | Status: DC | PRN

## 2019-04-01 MED ORDER — PREDNISONE 20 MG PO TABS: 40.0000 mg | ORAL_TABLET | Freq: Every day | ORAL | 0 refills | Status: DC

## 2019-04-01 MED FILL — ALBUTEROL 0.083% INHAL SOLN: (2.5 MG/3ML | 5 days supply | Qty: 75 | Fill #0

## 2019-04-01 MED FILL — ADVAIR 500/50 DISKUS: 500-50 | 30 days supply | Qty: 60 | Fill #0

## 2019-04-01 MED FILL — ALBUTEROL SULFATE HFA 108 (: 108 (90 BAS | 50 days supply | Qty: 18 | Fill #0

## 2019-04-01 MED FILL — MONTELUKAST SOD 10 MG TAB: 10 | 90 days supply | Qty: 90 | Fill #0

## 2019-04-01 MED FILL — predniSONE 20 MG TABS: 20 | 5 days supply | Qty: 10 | Fill #0

## 2019-04-01 NOTE — Progress Notes (Signed)
[place] Telemedicine Visit  Patient consented to have virtual visit. Method of visit: Telephone  Encounter participants: Patient: Donna Carlson - located at home Provider: Rory Percy - located at [facility] Others (if applicable): n/a  Chief Complaint: shortness of breath  HPI:  Patient reports she has been feeling short of breath for the past 3-4 days and has been using her albuterol nebulizer every 3-4 hours for the past 2 nights to get relief.  She denies any fever cough.  Denies sick contacts, recent travel, history of blood clots, difficulties catching her breath, lower extremity edema, paroxysmal nocturnal dyspnea.  Had asthma exacerbation back in January requiring steroids.  States her typical triggers are weather changes.  She does need refills on all of her asthma medications including PRN albuterol, Advair, Singulair.  ROS: per HPI  Pertinent PMHx: Mild persistent asthma, allergic rhinitis, obesity  Exam:  Respiratory: Speaks in full sentences, no respiratory distress  Assessment/Plan:  Asthma, mild persistent With acute exacerbation secondary to weather changes.  Will provide short steroid burst in attempt to control symptoms.  Instructed patient if symptoms are persistent after burst completed, she should come to clinic to be seen.  Per last note, has not had PFTs in quite some time, would recommend obtaining after exacerbation resolves.  Could also consider sleep study given obesity.  Return precautions discussed.    Time spent during visit with patient: 5 minutes

## 2019-04-01 NOTE — Assessment & Plan Note (Addendum)
With acute exacerbation secondary to weather changes.  Will provide short steroid burst in attempt to control symptoms.  Instructed patient if symptoms are persistent after burst completed, she should come to clinic to be seen.  Per last note, has not had PFTs in quite some time, would recommend obtaining after exacerbation resolves.  Could also consider sleep study given obesity.  Return precautions discussed.

## 2019-11-21 ENCOUNTER — Encounter (HOSPITAL_COMMUNITY): Payer: Self-pay | Admitting: Emergency Medicine

## 2019-11-21 ENCOUNTER — Emergency Department (HOSPITAL_COMMUNITY): Payer: 59

## 2019-11-21 ENCOUNTER — Emergency Department (HOSPITAL_COMMUNITY)
Admission: EM | Admit: 2019-11-21 | Discharge: 2019-11-22 | Disposition: A | Discharge status: Home / Self Care | Payer: 59 | Attending: Emergency Medicine | Admitting: Emergency Medicine

## 2019-11-21 ENCOUNTER — Other Ambulatory Visit: Payer: Self-pay

## 2019-11-21 DIAGNOSIS — J4521 Mild intermittent asthma with (acute) exacerbation: Secondary | ICD-10-CM | POA: Diagnosis not present

## 2019-11-21 DIAGNOSIS — Z79899 Other long term (current) drug therapy: Secondary | ICD-10-CM | POA: Insufficient documentation

## 2019-11-21 DIAGNOSIS — R509 Fever, unspecified: Secondary | ICD-10-CM | POA: Insufficient documentation

## 2019-11-21 DIAGNOSIS — R0602 Shortness of breath: Secondary | ICD-10-CM | POA: Diagnosis not present

## 2019-11-21 DIAGNOSIS — U071 COVID-19: Secondary | ICD-10-CM | POA: Insufficient documentation

## 2019-11-21 DIAGNOSIS — R05 Cough: Secondary | ICD-10-CM | POA: Diagnosis not present

## 2019-11-21 DIAGNOSIS — R42 Dizziness and giddiness: Secondary | ICD-10-CM | POA: Diagnosis not present

## 2019-11-21 LAB — CBC WITH DIFFERENTIAL/PLATELET
Abs Immature Granulocytes: 0.02 10*3/uL (ref 0.00–0.07)
Basophils Absolute: 0 10*3/uL (ref 0.0–0.1)
Basophils Relative: 1 %
Eosinophils Absolute: 0 10*3/uL (ref 0.0–0.5)
Eosinophils Relative: 0 %
HCT: 38.6 % (ref 36.0–46.0)
Hemoglobin: 12.1 g/dL (ref 12.0–15.0)
Immature Granulocytes: 0 %
Lymphocytes Relative: 22 %
Lymphs Abs: 1.1 10*3/uL (ref 0.7–4.0)
MCH: 29.2 pg (ref 26.0–34.0)
MCHC: 31.3 g/dL (ref 30.0–36.0)
MCV: 93 fL (ref 80.0–100.0)
Monocytes Absolute: 0.6 10*3/uL (ref 0.1–1.0)
Monocytes Relative: 12 %
Neutro Abs: 3.1 10*3/uL (ref 1.7–7.7)
Neutrophils Relative %: 65 %
Platelets: 185 10*3/uL (ref 150–400)
RBC: 4.15 MIL/uL (ref 3.87–5.11)
RDW: 14.2 % (ref 11.5–15.5)
WBC: 4.8 10*3/uL (ref 4.0–10.5)
nRBC: 0 % (ref 0.0–0.2)

## 2019-11-21 MED FILL — ALBUTEROL SULFATE HFA 108 (: 108 (90 BAS | 50 days supply | Qty: 18 | Fill #1

## 2019-11-21 MED FILL — ALBUTEROL 0.083% INHAL SOLN: (2.5 MG/3ML | 5 days supply | Qty: 75 | Fill #1

## 2019-11-21 NOTE — ED Notes (Signed)
Donna Carlson, daughter, [phone] would like an update

## 2019-11-21 NOTE — ED Triage Notes (Signed)
Per ems, pt from home. Pt reporting sob, tested for Covid today which resulted positive. Lung sounds clear by ems. Pt took 3 neb treatments at home with no relief. EMS VS Temp 97.36F, BP 132/90, 88 HR 100% room air, 107 cbg. Also reporting dizziness with standing, orthostatics negative with ems. Lightheaded, generalized body aches and nausea.

## 2019-11-21 NOTE — ED Notes (Signed)
X-ray at bedside

## 2019-11-21 NOTE — ED Provider Notes (Signed)
[facility] EMERGENCY DEPARTMENT Provider Note   CSN: [id number] Arrival date & time: 11/21/19  2219     History Chief Complaint  Patient presents with  . Shortness of Breath    Donna Carlson is a 44 y.o. female.  The history is provided by the patient and medical records. No language interpreter was used.  Shortness of Breath  Donna Carlson is a 44 y.o. female who presents to the Emergency Department complaining of shortness of breath. She presents the emergency department by EMS for evaluation of shortness of breath. She has a history of asthma and was diagnosed with COVID-19 infection today. She began feeling poorly yesterday with body aches, malaise and increase shortness of breath. Today her symptoms significantly worsened. She has associated subjective fever and feels lightheaded. She denies any vomiting, diarrhea. She does have some tightness across her chest. She denies any additional medical problems. Denies any chance of pregnancy. No history of DVT or PE. She does not take any hormones.    Past Medical History:  Diagnosis Date  . Asthma   . Palpitations     Patient Active Problem List   Diagnosis Date Noted  . Toe injury, left, initial encounter 01/27/2019  . Tinea pedis 01/27/2019  . Allergic rhinitis 06/04/2015  . Obesity 01/02/2008  . Asthma, mild persistent 10/18/2006    Past Surgical History:  Procedure Laterality Date  . GALLBLADDER SURGERY    . TUBAL LIGATION       OB History   No obstetric history on file.     Family History  Problem Relation Age of Onset  . Hypertension Mother   . Heart attack Paternal Grandfather   . Hypertension Maternal Grandmother     Social History   Tobacco Use  . Smoking status: Never Smoker  . Smokeless tobacco: Never Used  Substance Use Topics  . Alcohol use: Yes  . Drug use: No    Home Medications Prior to Admission medications   Medication Sig Start Date End Date Taking?  Authorizing Provider  albuterol (PROVENTIL) (2.5 MG/3ML) 0.083% nebulizer solution Take 3 mLs (2.5 mg total) by nebulization every 4 (four) hours as needed for wheezing or shortness of breath. 04/01/19  Yes Rumball, Bryson Ha, DO  albuterol (VENTOLIN HFA) 108 (90 Base) MCG/ACT inhaler INHALE 2 PUFFS BY MOUTH 2 TIMES DAILY AS NEEDED FOR WHEEZING OR SHORTNESS OF BREATH Patient taking differently: Inhale 2 puffs into the lungs 2 (two) times daily as needed for wheezing or shortness of breath.  04/01/19  Yes Rory Percy, DO  Fluticasone-Salmeterol (ADVAIR DISKUS) 500-50 MCG/DOSE AEPB Inhale 1 puff into the lungs 2 (two) times daily. 04/01/19  Yes Rumball, Bryson Ha, DO  montelukast (SINGULAIR) 10 MG tablet Take 1 tablet (10 mg total) by mouth at bedtime. 04/01/19  Yes Rory Percy, DO  benzonatate (TESSALON) 100 MG capsule Take 1 capsule (100 mg total) by mouth 3 (three) times daily as needed for cough. 11/22/19   Quintella Reichert, MD  loratadine (CLARITIN) 10 MG tablet Take 1 tablet (10 mg total) by mouth daily. Patient not taking: Reported on 11/21/2019 06/04/15   Virginia Crews, MD  ondansetron (ZOFRAN ODT) 4 MG disintegrating tablet Take 1 tablet (4 mg total) by mouth every 8 (eight) hours as needed for nausea or vomiting. 11/22/19   Quintella Reichert, MD  predniSONE (DELTASONE) 10 MG tablet Take 4 tablets (40 mg total) by mouth daily. 11/22/19   Quintella Reichert, MD    Allergies  Shellfish allergy, Dilaudid [hydromorphone hcl], and Iodine  Review of Systems   Review of Systems  Respiratory: Positive for shortness of breath.   All other systems reviewed and are negative.   Physical Exam Updated Vital Signs BP 136/89   Pulse 87   Temp 98.9 F (37.2 C) (Oral)   Resp 19   Ht 5' (1.524 m)   Wt 89.4 kg   LMP 11/04/2019   SpO2 100%   BMI 38.47 kg/m   Physical Exam Vitals and nursing note reviewed.  Constitutional:      Appearance: She is well-developed.  HENT:     Head: Normocephalic and  atraumatic.  Cardiovascular:     Rate and Rhythm: Normal rate and regular rhythm.     Heart sounds: No murmur.  Pulmonary:     Effort: Pulmonary effort is normal. No respiratory distress.     Breath sounds: Normal breath sounds.  Abdominal:     Palpations: Abdomen is soft.     Tenderness: There is no abdominal tenderness. There is no guarding or rebound.  Musculoskeletal:        General: No swelling or tenderness.  Skin:    General: Skin is warm and dry.  Neurological:     Mental Status: She is alert and oriented to person, place, and time.  Psychiatric:        Behavior: Behavior normal.     ED Results / Procedures / Treatments   Labs (all labs ordered are listed, but only abnormal results are displayed) Labs Reviewed  COMPREHENSIVE METABOLIC PANEL - Abnormal; Notable for the following components:      Result Value   CO2 21 (*)    Glucose, Bld 101 (*)    All other components within normal limits  CBC WITH DIFFERENTIAL/PLATELET    EKG EKG Interpretation  Date/Time:  Friday November 21 2019 23:20:51 EDT Ventricular Rate:  80 PR Interval:    QRS Duration: 104 QT Interval:  396 QTC Calculation: 457 R Axis:   49 Text Interpretation: Sinus rhythm Confirmed by Quintella Reichert [phone]) on 11/21/2019 11:23:14 PM   Radiology DG Chest Port 1 View  Result Date: 11/21/2019 CLINICAL DATA:  Shortness of breath. COVID positive. EXAM: PORTABLE CHEST 1 VIEW COMPARISON:  06/28/2017 FINDINGS: The cardiomediastinal contours are normal. The lungs are clear. Pulmonary vasculature is normal. No consolidation, pleural effusion, or pneumothorax. No acute osseous abnormalities are seen. IMPRESSION: Negative portable view of the chest. No radiopaque wreck findings of COVID pneumonia. Electronically Signed   By: Keith Rake M.D.   On: 11/21/2019 23:23    Procedures Procedures (including critical care time)  Medications Ordered in ED Medications  predniSONE (DELTASONE) tablet 40 mg (40 mg  Oral Given 11/22/19 0034)    ED Course  I have reviewed the triage vital signs and the nursing notes.  Pertinent labs & imaging results that were available during my care of the patient were reviewed by me and considered in my medical decision making (see chart for details).    MDM Rules/Calculators/A&P                     Patient with history of asthma, recent diagnosis of COVID-19 here for evaluation of increased shortness of breath, dizziness. On evaluation she has no respiratory distress with good air movement bilaterally. Imaging is negative for infiltrates. Presentation is not consistent with PE, ACS, bacterial pneumonia. Given patient's history of asthma, reports of wheezing prior to ED arrival will treat for  mild asthma exacerbation. Discussed with patient home care for COVID-19 infection. Plan to discharge home with outpatient follow-up.  Donna Carlson was evaluated in Emergency Department on 11/22/2019 for the symptoms described in the history of present illness. She was evaluated in the context of the global COVID-19 pandemic, which necessitated consideration that the patient might be at risk for infection with the SARS-CoV-2 virus that causes COVID-19. Institutional protocols and algorithms that pertain to the evaluation of patients at risk for COVID-19 are in a state of rapid change based on information released by regulatory bodies including the CDC and federal and state organizations. These policies and algorithms were followed during the patient's care in the ED.   Final Clinical Impression(s) / ED Diagnoses Final diagnoses:  COVID-19 virus infection  Mild intermittent asthma with exacerbation    Rx / DC Orders ED Discharge Orders         Ordered    predniSONE (DELTASONE) 10 MG tablet  Daily     11/22/19 0030    benzonatate (TESSALON) 100 MG capsule  3 times daily PRN     11/22/19 0030    ondansetron (ZOFRAN ODT) 4 MG disintegrating tablet  Every 8 hours PRN      11/22/19 0030           Quintella Reichert, MD 11/22/19 [phone]

## 2019-11-22 ENCOUNTER — Telehealth: Payer: Self-pay | Admitting: Family Medicine

## 2019-11-22 DIAGNOSIS — R0602 Shortness of breath: Secondary | ICD-10-CM | POA: Diagnosis not present

## 2019-11-22 DIAGNOSIS — J4521 Mild intermittent asthma with (acute) exacerbation: Secondary | ICD-10-CM | POA: Diagnosis not present

## 2019-11-22 DIAGNOSIS — Z79899 Other long term (current) drug therapy: Secondary | ICD-10-CM | POA: Diagnosis not present

## 2019-11-22 DIAGNOSIS — U071 COVID-19: Secondary | ICD-10-CM | POA: Diagnosis not present

## 2019-11-22 DIAGNOSIS — R509 Fever, unspecified: Secondary | ICD-10-CM | POA: Diagnosis not present

## 2019-11-22 DIAGNOSIS — R42 Dizziness and giddiness: Secondary | ICD-10-CM | POA: Diagnosis not present

## 2019-11-22 LAB — COMPREHENSIVE METABOLIC PANEL
ALT: 15 U/L (ref 0–44)
AST: 21 U/L (ref 15–41)
Albumin: 3.9 g/dL (ref 3.5–5.0)
Alkaline Phosphatase: 55 U/L (ref 38–126)
Anion gap: 12 (ref 5–15)
BUN: 8 mg/dL (ref 6–20)
CO2: 21 mmol/L — ABNORMAL LOW (ref 22–32)
Calcium: 8.9 mg/dL (ref 8.9–10.3)
Chloride: 105 mmol/L (ref 98–111)
Creatinine, Ser: 0.72 mg/dL (ref 0.44–1.00)
GFR calc Af Amer: >60 mL/min (ref >60)
GFR calc non Af Amer: >60 mL/min (ref >60)
Glucose, Bld: 101 mg/dL — ABNORMAL HIGH (ref 70–99)
Potassium: 3.6 mmol/L (ref 3.5–5.1)
Sodium: 138 mmol/L (ref 135–145)
Total Bilirubin: 0.9 mg/dL (ref 0.3–1.2)
Total Protein: 7.4 g/dL (ref 6.5–8.1)

## 2019-11-22 MED ORDER — ONDANSETRON 4 MG PO TBDP: 4.0000 mg | ORAL_TABLET | Freq: Three times a day (TID) | ORAL | 0 refills | Status: DC | PRN

## 2019-11-22 MED ORDER — PREDNISONE 10 MG PO TABS: 40.0000 mg | ORAL_TABLET | Freq: Every day | ORAL | 0 refills | Status: DC

## 2019-11-22 MED ORDER — PREDNISONE 20 MG PO TABS
40.0000 mg | ORAL_TABLET | Freq: Once | ORAL | Status: AC
  Administered 2019-11-22: 40 mg via ORAL
  Filled 2019-11-22: qty 2

## 2019-11-22 MED ORDER — BENZONATATE 100 MG PO CAPS: 100.0000 mg | ORAL_CAPSULE | Freq: Three times a day (TID) | ORAL | 0 refills | Status: DC | PRN

## 2019-11-22 NOTE — ED Notes (Signed)
Pt verbalizes understanding of d/c instructions. Prescriptions reviewed with patient. Pt ambulatory at d/c with all belongings.  

## 2019-11-22 NOTE — Telephone Encounter (Signed)
Called pt and informed her that I had referred her to the infusion center for an antibody infusion.  She is aware that if she ends up getting the infusion, she needs to delay her vaccination.    Her SOB remains controlled for now.  She was encouraged to present to the ED for worsening SOB, CP or new concerning symptoms.   Donna Haymaker, MD

## 2019-11-22 NOTE — Telephone Encounter (Signed)
**  After hours emergency line call**  Donna Carlson is a 44 year old female, with a history of elevated BMI and asthma, recently diagnosed with COVID on 4/1 via [facility] testing site (she is a Furniture conservator/restorer).  Seen in the ED overnight (4/2) last night due to shortness of breath.  On their evaluation she was without any respiratory distress or hypoxia, sent home with prednisone and Tessalon Perles.  She is calling in today to ask about the outpatient monoclonal antibody infusion and would like to see if she qualifies for this.  Discussed that she should hopefully hear a call from the infusion center next week because she would qualify due to her high risk factors.  I will also route this to her PCP to make sure this occurs.  Discussed with the patient if her shortness of breath worsens, experiences chest pain, confusion, or inability to maintain hydration that she needs to represent to the ED for evaluation.  Encouraged staying hydrated, MVI, continue prescribed medications, and laying prone as often as possible at home.  Patriciaann Clan, DO

## 2019-11-23 ENCOUNTER — Telehealth: Payer: Self-pay | Admitting: Unknown Physician Specialty

## 2019-11-23 ENCOUNTER — Other Ambulatory Visit: Payer: Self-pay | Admitting: Unknown Physician Specialty

## 2019-11-23 DIAGNOSIS — E669 Obesity, unspecified: Secondary | ICD-10-CM

## 2019-11-23 DIAGNOSIS — U071 COVID-19: Secondary | ICD-10-CM

## 2019-11-23 MED ORDER — SODIUM CHLORIDE 0.9 % IV SOLN
700.0000 mg | Freq: Once | INTRAVENOUS | Status: AC
  Administered 2019-11-24: 700 mg via INTRAVENOUS
  Filled 2019-11-23: qty 700

## 2019-11-23 NOTE — Telephone Encounter (Signed)
  I connected by phone with Donna Carlson on 11/23/2019 at 8:37 AM to discuss the potential use of an new treatment for mild to moderate COVID-19 viral infection in non-hospitalized patients.  This patient is a 44 y.o. female that meets the FDA criteria for Emergency Use Authorization of bamlanivimab or casirivimab\imdevimab.  Has a (+) direct SARS-CoV-2 viral test result  Has mild or moderate COVID-19   Is ? 44 years of age and weighs ? 40 kg  Is NOT hospitalized due to COVID-19  Is NOT requiring oxygen therapy or requiring an increase in baseline oxygen flow rate due to COVID-19  Is within 10 days of symptom onset  Has at least one of the high risk factor(s) for progression to severe COVID-19 and/or hospitalization as defined in EUA.  Specific high risk criteria : BMI >/= 35   I have spoken and communicated the following to the patient or parent/caregiver:  1. FDA has authorized the emergency use of bamlanivimab and casirivimab\imdevimab for the treatment of mild to moderate COVID-19 in adults and pediatric patients with positive results of direct SARS-CoV-2 viral testing who are 56 years of age and older weighing at least 40 kg, and who are at high risk for progressing to severe COVID-19 and/or hospitalization.  2. The significant known and potential risks and benefits of bamlanivimab and casirivimab\imdevimab, and the extent to which such potential risks and benefits are unknown.  3. Information on available alternative treatments and the risks and benefits of those alternatives, including clinical trials.  4. Patients treated with bamlanivimab and casirivimab\imdevimab should continue to self-isolate and use infection control measures (e.g., wear mask, isolate, social distance, avoid sharing personal items, clean and disinfect "high touch" surfaces, and frequent handwashing) according to CDC guidelines.   5. The patient or parent/caregiver has the option to accept or refuse  bamlanivimab or casirivimab\imdevimab .  After reviewing this information with the patient, The patient agreed to proceed with receiving the bamlanimivab infusion and will be provided a copy of the Fact sheet prior to receiving the infusion.[name] 11/23/2019 8:37 AM

## 2019-11-23 NOTE — Progress Notes (Signed)
  I connected by phone with Donna Carlson on 11/23/2019 at 8:45 AM to discuss the potential use of an new treatment for mild to moderate COVID-19 viral infection in non-hospitalized patients.  This patient is a 44 y.o. female that meets the FDA criteria for Emergency Use Authorization of bamlanivimab or casirivimab\imdevimab.  Has a (+) direct SARS-CoV-2 viral test result  Has mild or moderate COVID-19   Is ? 44 years of age and weighs ? 40 kg  Is NOT hospitalized due to COVID-19  Is NOT requiring oxygen therapy or requiring an increase in baseline oxygen flow rate due to COVID-19  Is within 10 days of symptom onset  Has at least one of the high risk factor(s) for progression to severe COVID-19 and/or hospitalization as defined in EUA.  Specific high risk criteria : BMI >/= 35   I have spoken and communicated the following to the patient or parent/caregiver:  1. FDA has authorized the emergency use of bamlanivimab and casirivimab\imdevimab for the treatment of mild to moderate COVID-19 in adults and pediatric patients with positive results of direct SARS-CoV-2 viral testing who are 63 years of age and older weighing at least 40 kg, and who are at high risk for progressing to severe COVID-19 and/or hospitalization.  2. The significant known and potential risks and benefits of bamlanivimab and casirivimab\imdevimab, and the extent to which such potential risks and benefits are unknown.  3. Information on available alternative treatments and the risks and benefits of those alternatives, including clinical trials.  4. Patients treated with bamlanivimab and casirivimab\imdevimab should continue to self-isolate and use infection control measures (e.g., wear mask, isolate, social distance, avoid sharing personal items, clean and disinfect "high touch" surfaces, and frequent handwashing) according to CDC guidelines.   5. The patient or parent/caregiver has the option to accept or refuse  bamlanivimab or casirivimab\imdevimab .  After reviewing this information with the patient, The patient agreed to proceed with receiving the bamlanimivab infusion and will be provided a copy of the Fact sheet prior to receiving the infusion.[name] 11/23/2019 8:45 AM

## 2019-11-24 ENCOUNTER — Other Ambulatory Visit: Payer: Self-pay

## 2019-11-24 ENCOUNTER — Emergency Department (HOSPITAL_COMMUNITY)
Admission: EM | Admit: 2019-11-24 | Discharge: 2019-11-24 | Disposition: A | Discharge status: Home / Self Care | Payer: 59 | Attending: Emergency Medicine | Admitting: Emergency Medicine

## 2019-11-24 ENCOUNTER — Emergency Department (HOSPITAL_COMMUNITY): Payer: 59

## 2019-11-24 ENCOUNTER — Ambulatory Visit (HOSPITAL_COMMUNITY)
Admission: RE | Admit: 2019-11-24 | Discharge: 2019-11-24 | Disposition: A | Discharge status: Home / Self Care | Payer: 59 | Source: Ambulatory Visit | Attending: Pulmonary Disease | Admitting: Pulmonary Disease

## 2019-11-24 DIAGNOSIS — R531 Weakness: Secondary | ICD-10-CM | POA: Diagnosis not present

## 2019-11-24 DIAGNOSIS — Z79899 Other long term (current) drug therapy: Secondary | ICD-10-CM | POA: Diagnosis not present

## 2019-11-24 DIAGNOSIS — R0602 Shortness of breath: Secondary | ICD-10-CM | POA: Diagnosis not present

## 2019-11-24 DIAGNOSIS — R5381 Other malaise: Secondary | ICD-10-CM | POA: Diagnosis not present

## 2019-11-24 DIAGNOSIS — L509 Urticaria, unspecified: Secondary | ICD-10-CM | POA: Diagnosis not present

## 2019-11-24 DIAGNOSIS — L299 Pruritus, unspecified: Secondary | ICD-10-CM | POA: Diagnosis present

## 2019-11-24 DIAGNOSIS — J45909 Unspecified asthma, uncomplicated: Secondary | ICD-10-CM | POA: Diagnosis not present

## 2019-11-24 DIAGNOSIS — E669 Obesity, unspecified: Secondary | ICD-10-CM | POA: Insufficient documentation

## 2019-11-24 DIAGNOSIS — Z6837 Body mass index (BMI) 37.0-37.9, adult: Secondary | ICD-10-CM | POA: Insufficient documentation

## 2019-11-24 DIAGNOSIS — T7840XA Allergy, unspecified, initial encounter: Secondary | ICD-10-CM | POA: Insufficient documentation

## 2019-11-24 DIAGNOSIS — U071 COVID-19: Secondary | ICD-10-CM

## 2019-11-24 LAB — CBC WITH DIFFERENTIAL/PLATELET
Abs Immature Granulocytes: 0.01 10*3/uL (ref 0.00–0.07)
Basophils Absolute: 0 10*3/uL (ref 0.0–0.1)
Basophils Relative: 1 %
Eosinophils Absolute: 0 10*3/uL (ref 0.0–0.5)
Eosinophils Relative: 0 %
HCT: 39.2 % (ref 36.0–46.0)
Hemoglobin: 12.2 g/dL (ref 12.0–15.0)
Immature Granulocytes: 0 %
Lymphocytes Relative: 22 %
Lymphs Abs: 0.7 10*3/uL (ref 0.7–4.0)
MCH: 28.8 pg (ref 26.0–34.0)
MCHC: 31.1 g/dL (ref 30.0–36.0)
MCV: 92.5 fL (ref 80.0–100.0)
Monocytes Absolute: 0.2 10*3/uL (ref 0.1–1.0)
Monocytes Relative: 6 %
Neutro Abs: 2.3 10*3/uL (ref 1.7–7.7)
Neutrophils Relative %: 71 %
Platelets: 193 10*3/uL (ref 150–400)
RBC: 4.24 MIL/uL (ref 3.87–5.11)
RDW: 14.3 % (ref 11.5–15.5)
WBC: 3.2 10*3/uL — ABNORMAL LOW (ref 4.0–10.5)
nRBC: 0 % (ref 0.0–0.2)

## 2019-11-24 LAB — COMPREHENSIVE METABOLIC PANEL
ALT: 14 U/L (ref 0–44)
AST: 17 U/L (ref 15–41)
Albumin: 3.8 g/dL (ref 3.5–5.0)
Alkaline Phosphatase: 51 U/L (ref 38–126)
Anion gap: 8 (ref 5–15)
BUN: <5 mg/dL — ABNORMAL LOW (ref 6–20)
CO2: 25 mmol/L (ref 22–32)
Calcium: 8.9 mg/dL (ref 8.9–10.3)
Chloride: 105 mmol/L (ref 98–111)
Creatinine, Ser: 0.65 mg/dL (ref 0.44–1.00)
GFR calc Af Amer: >60 mL/min (ref >60)
GFR calc non Af Amer: >60 mL/min (ref >60)
Glucose, Bld: 98 mg/dL (ref 70–99)
Potassium: 3.7 mmol/L (ref 3.5–5.1)
Sodium: 138 mmol/L (ref 135–145)
Total Bilirubin: 0.2 mg/dL — ABNORMAL LOW (ref 0.3–1.2)
Total Protein: 7.5 g/dL (ref 6.5–8.1)

## 2019-11-24 MED ORDER — ALBUTEROL SULFATE HFA 108 (90 BASE) MCG/ACT IN AERS: 2.0000 | INHALATION_SPRAY | Freq: Once | RESPIRATORY_TRACT | Status: DC | PRN

## 2019-11-24 MED ORDER — DIPHENHYDRAMINE HCL 25 MG PO TABS: 25.0000 mg | ORAL_TABLET | Freq: Three times a day (TID) | ORAL | 0 refills | Status: DC | PRN

## 2019-11-24 MED ORDER — METHYLPREDNISOLONE SODIUM SUCC 125 MG IJ SOLR
125.0000 mg | Freq: Once | INTRAMUSCULAR | Status: AC
  Administered 2019-11-24: 125 mg via INTRAVENOUS
  Filled 2019-11-24: qty 2

## 2019-11-24 MED ORDER — FAMOTIDINE 20 MG PO TABS: 20.0000 mg | ORAL_TABLET | Freq: Two times a day (BID) | ORAL | 0 refills | Status: DC | PRN

## 2019-11-24 MED ORDER — FAMOTIDINE IN NACL 20-0.9 MG/50ML-% IV SOLN: 20.0000 mg | Freq: Once | INTRAVENOUS | Status: DC | PRN

## 2019-11-24 MED ORDER — EPINEPHRINE 0.3 MG/0.3ML IJ SOAJ: 0.3000 mg | Freq: Once | INTRAMUSCULAR | Status: DC | PRN

## 2019-11-24 MED ORDER — DIPHENHYDRAMINE HCL 50 MG/ML IJ SOLN
50.0000 mg | Freq: Once | INTRAMUSCULAR | Status: AC | PRN
  Administered 2019-11-24: 50 mg via INTRAVENOUS
  Filled 2019-11-24: qty 1

## 2019-11-24 MED ORDER — SODIUM CHLORIDE 0.9 % IV SOLN: INTRAVENOUS | Status: DC | PRN

## 2019-11-24 MED ORDER — FAMOTIDINE IN NACL 20-0.9 MG/50ML-% IV SOLN
20.0000 mg | Freq: Once | INTRAVENOUS | Status: AC
  Administered 2019-11-24: 20 mg via INTRAVENOUS
  Filled 2019-11-24: qty 50

## 2019-11-24 MED ORDER — METHYLPREDNISOLONE SODIUM SUCC 125 MG IJ SOLR: 125.0000 mg | Freq: Once | INTRAMUSCULAR | Status: DC | PRN

## 2019-11-24 MED ORDER — LORATADINE 10 MG PO TABS: 10.0000 mg | ORAL_TABLET | Freq: Every day | ORAL | 0 refills | Status: DC | PRN

## 2019-11-24 MED ORDER — MUCINEX 600 MG PO TB12: 600.0000 mg | ORAL_TABLET | Freq: Two times a day (BID) | ORAL | 0 refills | Status: DC | PRN

## 2019-11-24 MED FILL — FAMOTIDINE 20 MG TABS: 20 | 10 days supply | Qty: 20 | Fill #0

## 2019-11-24 MED FILL — ONDANSETRON ODT 4 MG TABLET: 4 | 5 days supply | Qty: 15 | Fill #0

## 2019-11-24 MED FILL — BENZONATATE 100 MG CAPS: 100 | 7 days supply | Qty: 21 | Fill #0

## 2019-11-24 MED FILL — predniSONE 10 MG TABS: 10 | 3 days supply | Qty: 12 | Fill #0

## 2019-11-24 NOTE — Discharge Instructions (Signed)

## 2019-11-24 NOTE — Progress Notes (Addendum)
C/o of mild itching around neck and chest area .No rash noted.VSS. Denies sob or pain.Benadryl iv given and effective, will continue to monitor

## 2019-11-24 NOTE — ED Provider Notes (Signed)
[facility] EMERGENCY DEPARTMENT Provider Note   CSN: [id number] Arrival date & time: 11/24/19  1122     History Chief Complaint  Patient presents with  . Allergic Reaction  . Covid +    Donna Carlson is a 44 y.o. female with history of asthma, obesity presenting for evaluation of acute onset, improved episode of pruritus.  She was diagnosed with COVID-19 infection 5 days ago, has had symptoms for 6 days.  She reports feeling short of breath at rest and with exertion.  Denies chest pain, abdominal pain, nausea, or vomiting.  She has had low-grade fevers around 99 to 100 F.  She was seen and evaluated in the ED on 11/21/2019 and was found to be stable for discharge home at that time.  She has been administering nebulizers every 6 hours for shortness of breath.  She was found to meet criteria for outpatient infusion of monoclonal antibodies.  Presented to the infusion clinic today for her first infusion which began at around 9:45 AM.  She reports that within 5 minutes of the infusion she began to feel itching around the neck and chest.  She denies difficulty swallowing or change in her shortness of breath at that time.  Denies facial swelling or swelling of the lips or tongue.  She was given 50 mg of IV Benadryl which she reports was helpful for the itching but she began to feel drowsy and more short of breath.  She was noted to be hypoxic to 84% on room air and was started on supplemental oxygen.  She is a non-smoker.  The history is provided by the patient.       Past Medical History:  Diagnosis Date  . Asthma   . Palpitations     Patient Active Problem List   Diagnosis Date Noted  . Toe injury, left, initial encounter 01/27/2019  . Tinea pedis 01/27/2019  . Allergic rhinitis 06/04/2015  . Obesity 01/02/2008  . Asthma, mild persistent 10/18/2006    Past Surgical History:  Procedure Laterality Date  . GALLBLADDER SURGERY    . TUBAL LIGATION       OB  History   No obstetric history on file.     Family History  Problem Relation Age of Onset  . Hypertension Mother   . Heart attack Paternal Grandfather   . Hypertension Maternal Grandmother     Social History   Tobacco Use  . Smoking status: Never Smoker  . Smokeless tobacco: Never Used  Substance Use Topics  . Alcohol use: Yes  . Drug use: No    Home Medications Prior to Admission medications   Medication Sig Start Date End Date Taking? Authorizing Provider  albuterol (PROVENTIL) (2.5 MG/3ML) 0.083% nebulizer solution Take 3 mLs (2.5 mg total) by nebulization every 4 (four) hours as needed for wheezing or shortness of breath. 04/01/19   Rory Percy, DO  albuterol (VENTOLIN HFA) 108 (90 Base) MCG/ACT inhaler INHALE 2 PUFFS BY MOUTH 2 TIMES DAILY AS NEEDED FOR WHEEZING OR SHORTNESS OF BREATH Patient taking differently: Inhale 2 puffs into the lungs 2 (two) times daily as needed for wheezing or shortness of breath.  04/01/19   Rory Percy, DO  benzonatate (TESSALON) 100 MG capsule Take 1 capsule (100 mg total) by mouth 3 (three) times daily as needed for cough. 11/22/19   Quintella Reichert, MD  diphenhydrAMINE (BENADRYL) 25 MG tablet Take 1 tablet (25 mg total) by mouth every 8 (eight) hours as needed for  itching. 11/24/19   Shalini Mair A, PA-C  famotidine (PEPCID) 20 MG tablet Take 1 tablet (20 mg total) by mouth 2 (two) times daily as needed (itching). 11/24/19   Rachel Samples A, PA-C  Fluticasone-Salmeterol (ADVAIR DISKUS) 500-50 MCG/DOSE AEPB Inhale 1 puff into the lungs 2 (two) times daily. 04/01/19   Rory Percy, DO  guaiFENesin (MUCINEX) 600 MG 12 hr tablet Take 1 tablet (600 mg total) by mouth 2 (two) times daily as needed for cough. 11/24/19   Harlee Pursifull A, PA-C  loratadine (CLARITIN) 10 MG tablet Take 1 tablet (10 mg total) by mouth daily as needed for allergies or itching. 11/24/19   Azka Steger A, PA-C  montelukast (SINGULAIR) 10 MG tablet Take 1 tablet (10 mg total) by mouth  at bedtime. 04/01/19   Rory Percy, DO  ondansetron (ZOFRAN ODT) 4 MG disintegrating tablet Take 1 tablet (4 mg total) by mouth every 8 (eight) hours as needed for nausea or vomiting. 11/22/19   Quintella Reichert, MD  predniSONE (DELTASONE) 10 MG tablet Take 4 tablets (40 mg total) by mouth daily. 11/22/19   Quintella Reichert, MD    Allergies    Shellfish allergy, Dilaudid [hydromorphone hcl], and Iodine  Review of Systems   Review of Systems  Constitutional: Positive for chills and fever.  HENT: Negative for drooling, facial swelling, trouble swallowing and voice change.   Respiratory: Positive for cough and shortness of breath.   Cardiovascular: Negative for chest pain.  Gastrointestinal: Negative for abdominal pain, nausea and vomiting.  Musculoskeletal: Positive for myalgias.  Skin: Negative for rash.  All other systems reviewed and are negative.   Physical Exam Updated Vital Signs BP 133/89   Pulse 82   Temp 98.7 F (37.1 C) (Oral)   Resp 10   Ht 5' (1.524 m)   Wt 89.4 kg   LMP 11/04/2019   SpO2 100%   BMI 38.47 kg/m   Physical Exam Vitals and nursing note reviewed.  Constitutional:      General: She is not in acute distress.    Appearance: She is well-developed.     Comments: Appears fatigued but is alert  HENT:     Head: Normocephalic and atraumatic.     Mouth/Throat:     Mouth: Mucous membranes are moist.     Comments: No swelling of the lips, tongue, or periorbital region.  Tolerating secretions without difficulty.  No abnormal phonation.  No upper airway stridor.  No uvular edema. Eyes:     General:        Right eye: No discharge.        Left eye: No discharge.     Conjunctiva/sclera: Conjunctivae normal.  Neck:     Vascular: No JVD.     Trachea: No tracheal deviation.  Cardiovascular:     Rate and Rhythm: Normal rate and regular rhythm.  Pulmonary:     Effort: Pulmonary effort is normal.     Breath sounds: Normal breath sounds. No wheezing.      Comments: SPO2 saturations 100% on room air at rest, drops to 93% with ambulation and becomes more dyspneic and somewhat fatigued. Abdominal:     General: Bowel sounds are normal. There is no distension.     Palpations: Abdomen is soft.     Tenderness: There is no abdominal tenderness. There is no guarding or rebound.  Musculoskeletal:     Cervical back: Normal range of motion and neck supple. No rigidity.  Skin:    General:  Skin is warm and dry.     Findings: No erythema.  Neurological:     Mental Status: She is alert.  Psychiatric:        Behavior: Behavior normal.     ED Results / Procedures / Treatments   Labs (all labs ordered are listed, but only abnormal results are displayed) Labs Reviewed  COMPREHENSIVE METABOLIC PANEL - Abnormal; Notable for the following components:      Result Value   BUN <5 (*)    Total Bilirubin 0.2 (*)    All other components within normal limits  CBC WITH DIFFERENTIAL/PLATELET - Abnormal; Notable for the following components:   WBC 3.2 (*)    All other components within normal limits    EKG None  Radiology DG Chest Portable 1 View  Result Date: 11/24/2019 CLINICAL DATA:  COVID positive shortness of breath EXAM: PORTABLE CHEST 1 VIEW COMPARISON:  November 21, 2019 FINDINGS: The heart size and mediastinal contours are within normal limits. Again noted is slight elevation of the right hemidiaphragm. Both lungs are clear. The visualized skeletal structures are unremarkable. IMPRESSION: No active disease. Electronically Signed   By: Prudencio Pair M.D.   On: 11/24/2019 13:34    Procedures Procedures (including critical care time)  Medications Ordered in ED Medications  famotidine (PEPCID) IVPB 20 mg premix (20 mg Intravenous Bolus from Bag 11/24/19 1211)  methylPREDNISolone sodium succinate (SOLU-MEDROL) 125 mg/2 mL injection 125 mg (125 mg Intravenous Given 11/24/19 1211)    ED Course  I have reviewed the triage vital signs and the nursing  notes.  Pertinent labs & imaging results that were available during my care of the patient were reviewed by me and considered in my medical decision making (see chart for details).    MDM Rules/Calculators/A&P                      Donna Carlson was evaluated in Emergency Department on 11/24/2019 for the symptoms described in the history of present illness. She was evaluated in the context of the global COVID-19 pandemic, which necessitated consideration that the patient might be at risk for infection with the SARS-CoV-2 virus that causes COVID-19. Institutional protocols and algorithms that pertain to the evaluation of patients at risk for COVID-19 are in a state of rapid change based on information released by regulatory bodies including the CDC and federal and state organizations. These policies and algorithms were followed during the patient's care in the ED.  Patient presents from infusion center for possible allergic reaction.  She had the first monoclonal antibody infusion for her COVID-19 infection today and within 5 minutes of the infusion began to develop itching to the chest and abdomen.  This resolved with 50 mg IV Benadryl but with this she began to feel little short of breath and desaturated to 86% on room air, with improvement with supplemental oxygen.  On my assessment she is not requiring any supplemental oxygen and is maintaining SPO2 saturations at rest and with ambulation.  No evidence of respiratory distress or airway compromise.  No angioedema with no swelling of the face, lips, tongue, or uvula.  Does not meet criteria to receive epinephrine.  Lungs are clear to auscultation bilaterally and portable chest x-ray obtained shows no evidence of acute cardiopulmonary abnormalities with no edema or consolidation.  No pneumothorax.  Lab work reviewed by me shows mild leukopenia which is not unusual in the setting of known COVID-19 infection, no metabolic derangements, no  renal  insufficiency.  Her EKG shows no acute cardiopulmonary abnormalities.  She was observed in the ED given IV Solu-Medrol and Pepcid and on reevaluation is resting comfortably reports she feels comfortable with discharge home.  She is tolerating sips of fluid.  She has home resources set up for follow-up in the setting of her known COVID-19 infection.  She will also purchase a pulse oximeter to check her oxygen levels at home.  Recommend close follow-up with PCP.  Discussed management of symptoms with Benadryl and Pepcid as needed for itching.  Will hold on outpatient steroids at this time.  Discussed strict ED return precautions.  Patient verbalized understanding of and agreement with plan and is stable for discharge home at this time.  Patient was seen by Dr. Roslynn Amble who reviewed work-up  and agrees with assessment and plan at this time.   Final Clinical Impression(s) / ED Diagnoses Final diagnoses:  Allergic reaction, initial encounter  COVID-19 virus infection    Rx / DC Orders ED Discharge Orders         Ordered    guaiFENesin (MUCINEX) 600 MG 12 hr tablet  2 times daily PRN     11/24/19 1426    loratadine (CLARITIN) 10 MG tablet  Daily PRN     11/24/19 1426    famotidine (PEPCID) 20 MG tablet  2 times daily PRN     11/24/19 1426    diphenhydrAMINE (BENADRYL) 25 MG tablet  Every 8 hours PRN     11/24/19 1426           Jaze Rodino, [place] A, PA-C 11/24/19 1430    Lucrezia Starch, MD 11/25/19 [phone]

## 2019-11-24 NOTE — Progress Notes (Addendum)
Desaturation 84 percent on room air, and c/o sob and tightness.Denies chest pain.placed on 4 liters nasal cannula. O2 Sats up to 100 percent."

## 2019-11-24 NOTE — ED Notes (Signed)
Got patient undress on the monitor did ekg shown to the er doctor patient is resting with casll bell in reach

## 2019-11-24 NOTE — Progress Notes (Addendum)
C/o sob. No distress noted. Vitals stable. O2 sats 100 on room air. Lung sounds clear. Patient states she hs history of anxiety and asthma.

## 2019-11-24 NOTE — ED Triage Notes (Signed)
Pt arrives to ED via [facility] d/t a possible allergic reaction while she was at the infusion clinic after her recent diagnosis with COvid. It was reported that pt began to have hives during her infusion. She was given benadryl & then fatigue was exhibited as well as a 10-15 seconds of her O2 being in the low 80's while on RA. Pt A/Ox4 upon arrival to ED.

## 2019-11-24 NOTE — Discharge Instructions (Signed)
Do not return for further COVID infusions.   Continue taking all of your medicines as prescribed.  You can use the Tessalon as needed for cough.  You can use Mucinex twice daily, make sure to drink with a full glass of water to help with cough and congestion.  You can use Claritin during the day and Benadryl at night as needed for itching or other allergic reaction symptoms.  You can take Pepcid twice daily as well for similar symptoms.  Drink plenty of fluids and get rest.  I would recommend purchasing a pulse oximeter which is a device that can tell you your oxygen levels.  If you are feeling significantly short of breath with oxygen levels between 90 and 92% I would recommend seeking evaluation immediately.  Any oxygen levels below 90% are concerning and require immediate evaluation as well.  You can take 1 to 2 tablets of Tylenol every 6 hours as needed for fever.  Do not exceed 4000 mg of Tylenol daily.  Follow-up with your primary care provider for reevaluation of symptoms.  Return to the emergency department if any concerning signs or symptoms develop such as low oxygen levels, worsening shortness of breath, chest pains, swelling of the lips or tongue, throat tightness, drooling or voice changes, or loss of consciousness.

## 2019-11-24 NOTE — Progress Notes (Signed)
Pt transported to [facility] ED via EMS. Report given to ambulance staff.patients boyfriend made aware of event per patient's request.Belongings sent with EMS

## 2019-12-10 ENCOUNTER — Telehealth (INDEPENDENT_AMBULATORY_CARE_PROVIDER_SITE_OTHER): Payer: 59 | Admitting: Family Medicine

## 2019-12-10 ENCOUNTER — Other Ambulatory Visit: Payer: Self-pay | Admitting: Family Medicine

## 2019-12-10 ENCOUNTER — Other Ambulatory Visit: Payer: Self-pay

## 2019-12-10 DIAGNOSIS — J45909 Unspecified asthma, uncomplicated: Secondary | ICD-10-CM

## 2019-12-10 DIAGNOSIS — U071 COVID-19: Secondary | ICD-10-CM | POA: Diagnosis not present

## 2019-12-10 HISTORY — DX: COVID-19: U07.1

## 2019-12-10 MED ORDER — FLUTICASONE-SALMETEROL 500-50 MCG/DOSE IN AEPB: 1.0000 | INHALATION_SPRAY | Freq: Two times a day (BID) | RESPIRATORY_TRACT | 5 refills | Status: DC

## 2019-12-10 MED FILL — ADVAIR 500/50 DISKUS: 500-50 | 30 days supply | Qty: 60 | Fill #0

## 2019-12-10 NOTE — Assessment & Plan Note (Addendum)
Plan to return to light duty for 1 week starting this coming Monday.  After this week, she will return to full duty. -Work note for light duty placed front office -Refilled Advair

## 2019-12-10 NOTE — Progress Notes (Signed)
[facility] Telemedicine Visit  Patient consented to have virtual visit and was identified by name and date of birth. Method of visit: Video  Encounter participants: Patient: Donna Carlson - located in her car, parked, awaiting a hairdresser appointment Provider: Matilde Haymaker - located at [facility] Others (if applicable): None  Chief Complaint: Covid recovery  HPI:  COVID-19 infection She was diagnosed with a coronavirus infection on 4/2.  On 4/5, she received an antibody infusion to which she had a severe allergic reaction.  She was evaluated in the ED and ultimately sent home.  She has been out of work since that ED visit.  While at home, she did have significant symptoms of weakness, headaches, back pain and shortness of breath.  At this point, she seems to be over her significant symptoms.  She noticed a turning point this past weekend.  But she continues to feel improvements, she has been having some mild headache and lightheadedness today which improved mildly with Tylenol.  She is able to be mobile and has been grocery shopping as currently out to go have her hair done.  She does not experience any chest pain at all.  She does experience some shortness of breath with exertion.  She has chronic left lower extremity swelling which is unchanged from prior to Covid.  She recently discussed returning to work with her Freight [employer]er and was planning to return to light duty starting this coming Monday.   ROS: per HPI  Pertinent PMHx: Asthma, obesity  Exam:  There were no vitals taken for this visit.  General: Well-appearing 44 year old woman.  No acute distress.  Taking the video call from her car. Respiratory: Breathing comfortably on room air.  No shortness of breath or video encounter.  Able to complete long sentences without issue.  Assessment/Plan:  COVID-19 virus infection Plan to return to light duty for 1 week starting this coming Monday.  After this  week, she will return to full duty. -Work note for light duty placed front office -Refilled Advair    Time spent during visit with patient: 15 minutes

## 2019-12-11 ENCOUNTER — Telehealth: Payer: Self-pay | Admitting: Family Medicine

## 2019-12-11 NOTE — Telephone Encounter (Signed)
Patient is dropping off FMLA paperwork to be completed by Dr. Pilar Plate.   Last date of service: 12/10/19  Form placed in blue team folder.

## 2019-12-12 NOTE — Telephone Encounter (Signed)
Clinical info completed on FMLA form.  Place form in Dr. Baxter Flattery box for completion.  Clinic information only completed on first page due to patient returning to work but just needing restrictions.   Jazmin Hartsell, CMA

## 2019-12-17 NOTE — Telephone Encounter (Signed)
Form filled out and placed in folder in front office.  Matilde Haymaker, MD

## 2019-12-17 NOTE — Telephone Encounter (Signed)
Patient called and informed that form is ready for pick up.  Chandria Rookstool,CMA

## 2019-12-22 ENCOUNTER — Telehealth (INDEPENDENT_AMBULATORY_CARE_PROVIDER_SITE_OTHER): Payer: 59 | Admitting: Family Medicine

## 2019-12-22 ENCOUNTER — Other Ambulatory Visit: Payer: Self-pay

## 2019-12-22 DIAGNOSIS — U071 COVID-19: Secondary | ICD-10-CM

## 2019-12-22 NOTE — Progress Notes (Signed)
[facility] Telemedicine Visit  Patient consented to have virtual visit and was identified by name and date of birth. Method of visit: Telephone  Encounter participants: Patient: Donna Carlson - located at the store.  Out running errands. Provider: Matilde Haymaker - located at [facility] Others (if applicable): None  Chief Complaint: She needs a doctor's note to return to work  HPI:  Recovering from COVID-19 infection Donna Carlson was evaluated via virtual encounter last week and told that she could return to work at [employer] duty for 1 week then return without restrictions as long as she was having any significant trouble.  She is return for a follow-up virtual encounter she needs medical clearance to return to work at full duty.  Since our last conversation, she reports that she has generally been improving and reports only very mild shortness of breath with exertion.  She notes that she is about "90%" back to baseline.  She denies chest pain and lower extremity swelling.  She was that she is ready to return to work without restriction.  ROS: per HPI  Pertinent PMHx: COVID-19 infection, asthma, obesity  Exam:  There were no vitals taken for this visit.  Respiratory: No difficulty completing long sentences without effort.  Assessment/Plan:  COVID-19 virus infection She is demonstrated good recovery so far.  No concern for PE or DVT at this time.  She is informed that she could return to work without restriction and encouraged to call the clinic if she noted any new symptoms. -Work note left at the front office.    Time spent during visit with patient: 10 minutes

## 2019-12-22 NOTE — Assessment & Plan Note (Signed)
She is demonstrated good recovery so far.  No concern for PE or DVT at this time.  She is informed that she could return to work without restriction and encouraged to call the clinic if she noted any new symptoms. -Work note left at the front office.

## 2019-12-22 NOTE — Progress Notes (Signed)
Pt called from [employer]. Stated that she is in the process of getting her phone switched over. If it would be possible to just do a phone visit instead of video. Salvatore Marvel, CMA

## 2019-12-23 ENCOUNTER — Telehealth: Payer: Self-pay | Admitting: Family Medicine

## 2019-12-23 NOTE — Telephone Encounter (Signed)
Patient dropped off paperwork that Dr. Pilar Plate has already filled out but she is saying that 2 sections are not complete. Patient would like paperwork faxed to [phone]. She would also like a copy left up front for her to pick up. Number to call [phone]. Thanks

## 2019-12-24 NOTE — Telephone Encounter (Signed)
Form filled out and placed in folder in front office.  Matilde Haymaker, MD

## 2019-12-24 NOTE — Telephone Encounter (Signed)
Forms copied and placed in scan box, and  faxed to number given. Also copies where placed at the front for pt to pick up. Pt was informed and understood that forms are ready for pick up. Salvatore Marvel, CMA

## 2019-12-24 NOTE — Telephone Encounter (Signed)
Clinical info completed on work form.  Place form in Dr. Baxter Flattery box for completion.  Jazmin Hartsell, CMA

## 2019-12-31 NOTE — Telephone Encounter (Signed)
Patient states that she is returning phone call from provider regarding FMLA paperwork. Per patient, section with treatment dates was left blank. Per HR representative, treatment date section must be filled out. Also, provider signature page was not included in last fax and will need to be provided as well.   Fax Number: [phone]. Patient states that she can print off the forms again if we need them, however, she stated that in message we were faxed them again for completion.   To PCP  Please advise  Talbot Grumbling, RN

## 2020-01-01 NOTE — Telephone Encounter (Signed)
Forms printed and filled out and left with Jarrett Soho. Please scan and fax forms.  Thank you, Matilde Haymaker, MD

## 2020-01-01 NOTE — Telephone Encounter (Signed)
Forms faxed and copy placed in batch scanning. Patient called and informed.   Talbot Grumbling, RN

## 2020-01-20 ENCOUNTER — Ambulatory Visit: Payer: 59 | Admitting: Family Medicine

## 2020-02-09 ENCOUNTER — Encounter: Payer: Self-pay | Admitting: Family Medicine

## 2020-02-09 ENCOUNTER — Other Ambulatory Visit: Payer: Self-pay

## 2020-02-09 ENCOUNTER — Ambulatory Visit: Payer: 59 | Admitting: Family Medicine

## 2020-02-09 DIAGNOSIS — F4329 Adjustment disorder with other symptoms: Secondary | ICD-10-CM | POA: Diagnosis not present

## 2020-02-09 MED ORDER — PREDNISONE 50 MG PO TABS: 50.0000 mg | ORAL_TABLET | Freq: Every day | ORAL | 0 refills | Status: DC

## 2020-02-09 MED ORDER — HYDROXYZINE HCL 10 MG PO TABS: 10.0000 mg | ORAL_TABLET | Freq: Three times a day (TID) | ORAL | 1 refills | Status: DC | PRN

## 2020-02-09 MED FILL — predniSONE 50 MG TABS: 50 | 5 days supply | Qty: 5 | Fill #0

## 2020-02-09 MED FILL — hydrOXYzine HCL 10 MG TABS: 10 | 10 days supply | Qty: 30 | Fill #0

## 2020-02-09 NOTE — Patient Instructions (Signed)
It was nice to meet you today!  Sent in hydroxyzine for anxiety - can take as needed Use caution as it may make you sleepy If not improving with this please let us know and we can look at adding other medications Keep doing EAP, massages, etc for stress management  For asthma - take prednisone 50mg  daily for 5 days Let us know if this does not improve with prednisone, can consider chest xray then  Check with health at work about your last tetanus shot and Hep C test  Due for pap smear - schedule this with Dr. Pilar Plate  Be well, Dr. Ardelia Mems

## 2020-02-09 NOTE — Progress Notes (Signed)
  Date of Visit: 02/09/2020   SUBJECTIVE:   HPI:  Donna Carlson presents today for shortness of breath and anxiety.  Shortness of breath - has history of asthma, has had for years. Intermittently requires treatment with oral steroids. Had COVID back in April/May. Recently has noted increased shortness of breath with exertion and wheezing feeling. No fever or cough. Denies chest pain, swelling. Does note heart beats faster than normal sometimes. Using albuterol regularly as well as advair, singulair. She thinks she may need another steroid burst.  Anxiety - having increased situational anxiety lately, pertaining to her job. Her work schedule has changed and this has been a source of stress. Denies thoughts of harming herself or others. GAD-7 score 15, "somewhat difficult". Has not been treated for anxiety in the past. Is interested in something to help her calm down when anxiety gets particularly bad. This has gone on for about 3 weeks. She has seen a counselor through [facility] once, has another appointment this week to follow up. Has also been working on breathing exercises to calm herself down.    OBJECTIVE:   BP 114/78   Pulse 71   Ht 5' (1.524 m)   Wt 206 lb 12.8 oz (93.8 kg)   LMP 01/10/2020 (Exact Date)   SpO2 99%   BMI 40.39 kg/m  Gen: no acute distress, pleasant, cooperative HEENT: normocephalic, atraumatic  Heart: regular rate and rhythm, no murmur Lungs: clear to auscultation bilaterally, normal work of breathing. No wheezing appreciated. Speaks in full sentences without distress. Neuro: alert, speech normal, nonfocal Psych: normal range of affect, well groomed, speech normal in rate and volume, normal eye contact  Ext: No appreciable lower extremity edema bilaterally   ASSESSMENT/PLAN:   Health maintenance:  -advised she is due for pap smear -Tdap not current in our system, though is a [employer] employee so she will contact [facility] regarding her last Tdap vaccine (she also  thinks she may have had Hep C screening through them, and will check) -advised getting COVID vaccine (she was told by [facility] to wait 90 days after her COVID infection)  Shortness of breath Suspect asthma exacerbation; may be more sensitive to exacerbations as a result of recent COVID infection. No wheezes on my exam, but patient has been using albuteorl frequently. Will rx prednisone burst. If this does not improve symptoms, she will contact us, would consider CXR at that time. 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 Heart is very regular in rate & rhythm today. Follow up if not improving.  Stress and adjustment reaction Increased anxiety/stress related to situational issues at work. Has already implemented counseling and behavioral changes to cope. Reviewed options for treatment/management, including daily medication (likely SSRI) vs hydroxyzine for acute control of anxiety. Patient opted for hydroxyzine. rx given. She will continue with counseling and lifestyle changes to help control anxiety. Follow up if not improving or if worsens.   [place]. Ardelia Mems, [place]

## 2020-02-14 DIAGNOSIS — F4329 Adjustment disorder with other symptoms: Secondary | ICD-10-CM | POA: Insufficient documentation

## 2020-02-14 NOTE — Assessment & Plan Note (Signed)
Increased anxiety/stress related to situational issues at work. Has already implemented counseling and behavioral changes to cope. Reviewed options for treatment/management, including daily medication (likely SSRI) vs hydroxyzine for acute control of anxiety. Patient opted for hydroxyzine. rx given. She will continue with counseling and lifestyle changes to help control anxiety. Follow up if not improving or if worsens.

## 2020-03-29 ENCOUNTER — Ambulatory Visit: Payer: 59 | Admitting: Family Medicine

## 2020-03-29 ENCOUNTER — Other Ambulatory Visit: Payer: Self-pay

## 2020-03-29 ENCOUNTER — Encounter: Payer: Self-pay | Admitting: Family Medicine

## 2020-03-29 VITALS — BP 110/68 | HR 77 | Ht 60.0 in | Wt 209.1 lb

## 2020-03-29 DIAGNOSIS — R0602 Shortness of breath: Secondary | ICD-10-CM

## 2020-03-29 DIAGNOSIS — K56609 Unspecified intestinal obstruction, unspecified as to partial versus complete obstruction: Secondary | ICD-10-CM

## 2020-03-29 DIAGNOSIS — F4329 Adjustment disorder with other symptoms: Secondary | ICD-10-CM

## 2020-03-29 NOTE — Assessment & Plan Note (Signed)
Based on her moderate symptoms and some improvement with working with therapy, we discussed continued therapy today versus starting medications.  We agreed that continued therapy without medications would be appropriate at this time. -Continue hydroxyzine 10 mg as needed -Patient provided with list of therapists in the community.  She is also encouraged to continue seeing her current therapist to continue that therapeutic relationship if possible

## 2020-03-29 NOTE — Assessment & Plan Note (Signed)
Most likely lung damage secondary to COVID-19 infection versus worsening asthma symptoms.  At this point, we will move forward with a chest x-ray to ensure no evidence of smoldering infection.  She was advised to continue with her current asthma medicine and to begin taking 2 puffs of albuterol prior to exercise to avoid her symptoms of exercise-induced asthma exacerbations. -2 puffs of albuterol 10-15 minutes prior to exercise -Follow-up at the post Covid clinic, contact information provided to patient  With regard to her concern about vaccination, she told that it would still be beneficial for her to receive the vaccine although it should be done in a controlled setting with easy access to appropriate medications if she does have an allergic reaction.  She was encouraged to reach out to help at work to schedule an appropriate setting for her vaccine.

## 2020-03-29 NOTE — Progress Notes (Signed)
SUBJECTIVE:   CHIEF COMPLAINT / HPI:   SOB Hx of asthma Dx COVID 11/21/2019 Vaccine? Office visit 6/21 - asthma exacerbation Today -she continues to experience shortness of breath and wheezing with exertion.  She notes that she notices this almost exclusively with exercising when she goes on her walks.  This typically resolves well by taking her albuterol.  This is frustrating to her because it seems like she has a new respiratory baseline.  She did not used to be so limited in her walking.  She reports that she did experience improvement following the steroid course from her last visit but it was not sustained after she finished the steroids.  She has not yet received the Covid vaccines because she had a significant reaction to [place] which she was given shortly following her Covid diagnosis.  She wants to know if it is safe for her to receive her Covid vaccine.  Anxiety For the past 2-3 months, she is noted symptoms of anxiety.  She does not think that these come in episodes but rather has an elevated level of anxiety all day.  She reports that she wakes up feeling anxious.  During her last visit, she was given hydroxyzine 10 mg to provide some symptomatic relief that would not make her drowsy so that she could take this medicine at work.  She has taken this on a handful of occasions and finds it mildly helpful although it makes her feel drained.  Through her work, she has been able to work with the therapist for 3 visits which she has found helpful.  Her GAD-7 today has a total score of 12.  PERTINENT  PMH / PSH:  Asthma, allergies, COVID -19 infection (11/2019)  OBJECTIVE:   BP 110/68   Pulse 77   Ht 5' (1.524 m)   Wt 209 lb 2 oz (94.9 kg)   LMP 03/13/2020   SpO2 99%   BMI 40.84 kg/m    General: Alert and cooperative and appears to be in no acute distress Cardio: Normal S1 and S2, no S3 or S4. Rhythm is regular. No murmurs or rubs.   Pulm: Clear to auscultation  bilaterally, no crackles, or diminished breath sounds.  Very mild wheezing noted in the left lower lung field.  Normal respiratory effort.  No respiratory distress. Abdomen: Bowel sounds normal. Abdomen soft and non-tender.  Extremities: No peripheral edema. Warm/ well perfused.  Strong radial pulse.   ASSESSMENT/PLAN:   Shortness of breath Most likely lung damage secondary to COVID-19 infection versus worsening asthma symptoms.  At this point, we will move forward with a chest x-ray to ensure no evidence of smoldering infection.  She was advised to continue with her current asthma medicine and to begin taking 2 puffs of albuterol prior to exercise to avoid her symptoms of exercise-induced asthma exacerbations. -2 puffs of albuterol 10-15 minutes prior to exercise -Follow-up at the post Covid clinic, contact information provided to patient  With regard to her concern about vaccination, she told that it would still be beneficial for her to receive the vaccine although it should be done in a controlled setting with easy access to appropriate medications if she does have an allergic reaction.  She was encouraged to reach out to help at work to schedule an appropriate setting for her vaccine.  Stress and adjustment reaction Based on her moderate symptoms and some improvement with working with therapy, we discussed continued therapy today versus starting medications.  We agreed that  continued therapy without medications would be appropriate at this time. -Continue hydroxyzine 10 mg as needed -Patient provided with list of therapists in the community.  She is also encouraged to continue seeing her current therapist to continue that therapeutic relationship if possible     Matilde Haymaker, MD [place]

## 2020-03-29 NOTE — Patient Instructions (Addendum)
Shortness of breath: I think this is either worsened symptoms of asthma or related to your Covid infection.  For now, start taking 2 puffs of your inhaler in 10-15 minutes prior to exercise and continue your other asthma medications as prescribed.  I also recommend that you call the following number to get set up with the post Covid clinic.  The number JJ:[phone].  Please let me know if you have any trouble with this.  For your chest x-ray, you can walk into [facility] imaging to have that done.  Covid vaccine: I recommend that you call health at work to ask for recommendations regarding your vaccine.  They may advise she is seeing an immunologist for direct you to the safest place for you to receive the vaccine.  I recommend that you do get the vaccine.  Anxiety: Lets continue your hydroxyzine as needed for now.  Please see the list below for therapists you can contact to continue therapy sessions.   Therapy and Counseling Resources Most providers on this list will take Medicaid. Patients with commercial insurance or Medicare should contact their insurance company to get a list of in network providers.  [facility]  [address] [place], [place] [address]      [place] [address]  [place], [place] [address] [phone]  [place] [address] [place]  [place], [place] [address]       [phone]      [facility] [address] [place], [place]  [facility]:  [place]. [place] [phone]  [facility]:  [address] [address] [place] [phone]  [facility] (under & uninsured) [address], [place] [phone]    [email or web address]    [place] [address] . [place]    [phone]  [facility] [place]     Phone:  [phone]     [place]  [place] [address]      [place], [place] ext [place]: [place], [place], [place]   [place] ([place] therapist) [address] [place] [address]   [place] [address]    [phone]    [facility]   [place],  [place], [place]   [place]  [place]  [facility]  [address] [place], [place]        [phone]  Open Access/Walk In Clinic under & uninsured  [facility]  [address] [place], [place] [facility] [phone]  [facility] [place],  ([place])   [place], [place]: [phone]) 8:30 - 12; 1 - 2:30  [facility] the [place],  [place], [place]    [phone]):8:30 - 12; 2 - 3PM  [facility] [employer],  [address]  [place]; [phone]):   Mon - Fri 8 AM - 5 PM  [facility] [place]  MWF 12:30 to 3:00 or call to schedule an appointment  [phone]  Specific Provider options Psychology Today  [email or web address] 1. click on  find a therapist  2. enter your zip code 3. left side and select or tailor a therapist for your specific need.   [facility] Provider Directory [email or web address]  (Medicaid)   Follow all drop down to find a provider  [place] [phone] or [email or web address] [address], [place], [place] Recovery support and educational   24- Hour Availability:  .  [facility]  . [place], [place] Crisis [phone]  . [facility] the [employer]  [phone]  [facility] Crisis Service  [phone]   . [place]  [phone] (after hours)  . [facility]/Mobile Crisis   [phone]  . [place] National Suicide Hotline  873-524-1703 ([place])  . Call 911 or go to emergency room  . [employer]  [phone]);  [place] and [employer]   . [facility]  [phone]); [place], [place], [place], [place], [place], [place], [place]

## 2020-04-01 ENCOUNTER — Other Ambulatory Visit: Payer: Self-pay

## 2020-04-01 ENCOUNTER — Ambulatory Visit: Payer: 59 | Admitting: Family Medicine

## 2020-04-01 VITALS — BP 128/98 | HR 75 | Ht 60.0 in | Wt 210.8 lb

## 2020-04-01 DIAGNOSIS — R399 Unspecified symptoms and signs involving the genitourinary system: Secondary | ICD-10-CM

## 2020-04-01 HISTORY — DX: Unspecified symptoms and signs involving the genitourinary system: R39.9

## 2020-04-01 LAB — POCT UA - MICROSCOPIC ONLY
RBC, urine, microscopic: >20
WBC, Ur, HPF, POC: >20

## 2020-04-01 LAB — POCT URINALYSIS DIP (MANUAL ENTRY)
Bilirubin, UA: NEGATIVE
Glucose, UA: NEGATIVE mg/dL
Ketones, POC UA: NEGATIVE mg/dL
Nitrite, UA: POSITIVE — AB
Protein Ur, POC: 100 mg/dL — AB
Spec Grav, UA: 1.02 (ref 1.010–1.025)
Urobilinogen, UA: 0.2 E.U./dL
pH, UA: 5.5 (ref 5.0–8.0)

## 2020-04-01 MED ORDER — CEPHALEXIN 500 MG PO CAPS: 500.0000 mg | ORAL_CAPSULE | Freq: Four times a day (QID) | ORAL | 0 refills | Status: AC

## 2020-04-01 MED FILL — CEPHALEXIN 500 MG CAPSULE: 500 | 5 days supply | Qty: 20 | Fill #0

## 2020-04-01 NOTE — Patient Instructions (Signed)
Thank you for coming to see me today. It was a pleasure. Today we talked about:   You have a UTI.  We will treat you with an antibiotic for 5 days.  Eat yogurt.  If you have fevers, chills, worsening back pain, inability to tolerate anything by mouth, you should be seen right away.  Return in 1 week if no improvement.  Please follow-up with Dr. Pilar Plate for your anxiety.  If you have any questions or concerns, please do not hesitate to call the office at [phone].  Best,   [place] Constable, DO

## 2020-04-01 NOTE — Assessment & Plan Note (Signed)
UA consistent with UTI. Culture sent. Patient does not had fevers or chills, pyelonephritis less likely. Will treat for uncomplicated acute cystitis with Keflex 4 times daily x5 days. Patient given return precautions including fever, chills, inability to tolerate p.o., worsening of back pain. She voiced understanding. Return in a week if no improvement.

## 2020-04-01 NOTE — Progress Notes (Signed)
    SUBJECTIVE:   CHIEF COMPLAINT / HPI:   Abdominal Pain Lower Pelvic Pain Pain 10/10 and worse at night 2 days Tried OTC Azo, didn't help with the pain Hurts at the end when she pees No hematuria, fevers, chills, nausea, vomiting Increased frequency of urination Increased Urgency Pain seems to radiate to her right side when she is lying at night Can't remember if this feels similar to prior UTI No vaginal discharge, possibly a little odor No chance for pregnancy or STDs Has been having BMs  PERTINENT  PMH / PSH: Asthma  OBJECTIVE:   BP (!) 128/98   Pulse 75   Ht 5' (1.524 m)   Wt 210 lb 12.8 oz (95.6 kg)   LMP 03/13/2020   SpO2 100%   BMI 41.17 kg/m    Physical Exam:  General: 44 y.o. female in NAD Cardio: RRR no m/r/g Lungs: CTAB, no wheezing, no rhonchi, no crackles, no IWOB on RA Abdomen: Soft, TTP suprapubic region, non-distended, right-sided CVA tenderness Skin: warm and dry  Results for orders placed or performed in visit on 04/01/20 (from the past 24 hour(s))  POCT urinalysis dipstick     Status: Abnormal   Collection Time: 04/01/20  8:45 AM  Result Value Ref Range   Color, UA yellow yellow   Clarity, UA cloudy (A) clear   Glucose, UA negative negative mg/dL   Bilirubin, UA negative negative   Ketones, POC UA negative negative mg/dL   Spec Grav, UA 1.020 1.010 - 1.025   Blood, UA large (A) negative   pH, UA 5.5 5.0 - 8.0   Protein Ur, POC =100 (A) negative mg/dL   Urobilinogen, UA 0.2 0.2 or 1.0 E.U./dL   Nitrite, UA Positive (A) Negative   Leukocytes, UA Large (3+) (A) Negative  POCT UA - Microscopic Only     Status: Abnormal   Collection Time: 04/01/20  8:45 AM  Result Value Ref Range   WBC, Ur, HPF, POC >20    RBC, urine, microscopic >20    Bacteria, U Microscopic FEW    Epithelial cells, urine per micros 5-10       ASSESSMENT/PLAN:   UTI symptoms UA consistent with UTI. Culture sent. Patient does not had fevers or chills,  pyelonephritis less likely. Will treat for uncomplicated acute cystitis with Keflex 4 times daily x5 days. Patient given return precautions including fever, chills, inability to tolerate p.o., worsening of back pain. She voiced understanding. Return in a week if no improvement.   PHQ-9 of 9 today. Patient reports history of anxiety that she recently spoke with her PCP about. Denies SI. She states that she has a list of therapist. Advised her to come back to talk with Dr. Pilar Plate about this.  Patient also counseled on importance of COVID-19 vaccination and questions answered. She will think about getting this vaccination.  Donna Carlson, [place]

## 2020-04-02 ENCOUNTER — Ambulatory Visit (HOSPITAL_COMMUNITY)
Admission: EM | Admit: 2020-04-02 | Discharge: 2020-04-02 | Disposition: A | Discharge status: Home / Self Care | Payer: 59 | Attending: Physician Assistant | Admitting: Physician Assistant

## 2020-04-02 ENCOUNTER — Telehealth: Payer: Self-pay

## 2020-04-02 ENCOUNTER — Other Ambulatory Visit: Payer: Self-pay

## 2020-04-02 ENCOUNTER — Encounter (HOSPITAL_COMMUNITY): Payer: Self-pay

## 2020-04-02 DIAGNOSIS — N390 Urinary tract infection, site not specified: Secondary | ICD-10-CM | POA: Diagnosis not present

## 2020-04-02 DIAGNOSIS — Z3202 Encounter for pregnancy test, result negative: Secondary | ICD-10-CM | POA: Diagnosis not present

## 2020-04-02 DIAGNOSIS — R109 Unspecified abdominal pain: Secondary | ICD-10-CM | POA: Diagnosis not present

## 2020-04-02 LAB — CBC
HCT: 37 % (ref 36.0–46.0)
Hemoglobin: 11.7 g/dL — ABNORMAL LOW (ref 12.0–15.0)
MCH: 28.5 pg (ref 26.0–34.0)
MCHC: 31.6 g/dL (ref 30.0–36.0)
MCV: 90 fL (ref 80.0–100.0)
Platelets: 263 10*3/uL (ref 150–400)
RBC: 4.11 MIL/uL (ref 3.87–5.11)
RDW: 13.7 % (ref 11.5–15.5)
WBC: 5.9 10*3/uL (ref 4.0–10.5)
nRBC: 0 % (ref 0.0–0.2)

## 2020-04-02 LAB — POCT URINALYSIS DIPSTICK, ED / UC
Bilirubin Urine: NEGATIVE
Glucose, UA: NEGATIVE mg/dL
Ketones, ur: NEGATIVE mg/dL
Nitrite: NEGATIVE
Protein, ur: NEGATIVE mg/dL
Specific Gravity, Urine: 1.01 (ref 1.005–1.030)
Urobilinogen, UA: 0.2 mg/dL (ref 0.0–1.0)
pH: 6.5 (ref 5.0–8.0)

## 2020-04-02 LAB — BASIC METABOLIC PANEL
Anion gap: 8 (ref 5–15)
BUN: 10 mg/dL (ref 6–20)
CO2: 26 mmol/L (ref 22–32)
Calcium: 9.1 mg/dL (ref 8.9–10.3)
Chloride: 102 mmol/L (ref 98–111)
Creatinine, Ser: 0.88 mg/dL (ref 0.44–1.00)
GFR calc Af Amer: >60 mL/min (ref >60)
GFR calc non Af Amer: >60 mL/min (ref >60)
Glucose, Bld: 99 mg/dL (ref 70–99)
Potassium: 3.6 mmol/L (ref 3.5–5.1)
Sodium: 136 mmol/L (ref 135–145)

## 2020-04-02 LAB — POC URINE PREG, ED: Preg Test, Ur: NEGATIVE

## 2020-04-02 MED ORDER — LIDOCAINE HCL (PF) 1 % IJ SOLN
INTRAMUSCULAR | Status: AC
  Filled 2020-04-02: qty 2

## 2020-04-02 MED ORDER — HYDROCODONE-ACETAMINOPHEN 5-325 MG PO TABS: 1.0000 | ORAL_TABLET | ORAL | 0 refills | Status: DC | PRN

## 2020-04-02 MED ORDER — CEFTRIAXONE SODIUM 1 G IJ SOLR
INTRAMUSCULAR | Status: AC
  Filled 2020-04-02: qty 10

## 2020-04-02 MED ORDER — CEFTRIAXONE SODIUM 1 G IJ SOLR
1.0000 g | Freq: Once | INTRAMUSCULAR | Status: AC
  Administered 2020-04-02: 1 g via INTRAMUSCULAR

## 2020-04-02 MED ORDER — KETOROLAC TROMETHAMINE 60 MG/2ML IM SOLN
INTRAMUSCULAR | Status: AC
  Filled 2020-04-02: qty 2

## 2020-04-02 MED ORDER — KETOROLAC TROMETHAMINE 60 MG/2ML IM SOLN
60.0000 mg | Freq: Once | INTRAMUSCULAR | Status: AC
  Administered 2020-04-02: 60 mg via INTRAMUSCULAR

## 2020-04-02 MED ORDER — ONDANSETRON HCL 4 MG PO TABS: 4.0000 mg | ORAL_TABLET | Freq: Three times a day (TID) | ORAL | 0 refills | Status: DC | PRN

## 2020-04-02 NOTE — ED Triage Notes (Signed)
Pt reports abdominal pain x 3 days. States she went to her PCP yesterday ans started antibiotic for the UTI.

## 2020-04-02 NOTE — Telephone Encounter (Signed)
Patient calls nurse line regarding worsening pain in abdomen. Patient states pain is located in lower abdomen and radiates toward back. Rates pain at a 20/10. Patient reports taking six doses of antibiotic. Denies fever or vomiting. Advised patient to be evaluated in UC due to continued severe pain.   Patient verbalizes understanding and will report to UC for evaluation.   To PCP and Dr. Leonidas Romberg C Jahlisa Rossitto

## 2020-04-02 NOTE — ED Provider Notes (Signed)
[facility]    CSN: [id number] Arrival date & time: 04/02/20  1804      History   Chief Complaint Chief Complaint  Patient presents with  . Abdominal Pain    HPI Donna Carlson is a 44 y.o. female.   Patient presents with lower abdominal pain radiating into her left flank.  She reports symptoms been present for the last 3 days.  She saw her primary care yesterday and was treated for urinary tract infection.  She reports she has taken 6 total doses of the Keflex.  She reports she is continue to have abdominal pain and side pain that radiates into her back.  She reports this is been constant.  She denies fever, chills, nausea and vomiting.  Moving her bowels per usual.  She reports improvement in her urinary tract symptoms such as frequency, pain and urgency.  Denies a history of kidney stones.     Past Medical History:  Diagnosis Date  . Asthma   . Palpitations     Patient Active Problem List   Diagnosis Date Noted  . UTI symptoms 04/01/2020  . Shortness of breath 03/29/2020  . Stress and adjustment reaction 02/14/2020  . COVID-19 virus infection 12/10/2019  . Toe injury, left, initial encounter 01/27/2019  . Tinea pedis 01/27/2019  . Allergic rhinitis 06/04/2015  . Obesity 01/02/2008  . Asthma, mild persistent 10/18/2006    Past Surgical History:  Procedure Laterality Date  . GALLBLADDER SURGERY    . TUBAL LIGATION      OB History   No obstetric history on file.      Home Medications    Prior to Admission medications   Medication Sig Start Date End Date Taking? Authorizing Provider  albuterol (PROVENTIL) (2.5 MG/3ML) 0.083% nebulizer solution Take 3 mLs (2.5 mg total) by nebulization every 4 (four) hours as needed for wheezing or shortness of breath. 04/01/19   Rory Percy, DO  albuterol (VENTOLIN HFA) 108 (90 Base) MCG/ACT inhaler INHALE 2 PUFFS BY MOUTH 2 TIMES DAILY AS NEEDED FOR WHEEZING OR SHORTNESS OF BREATH Patient taking  differently: Inhale 2 puffs into the lungs 2 (two) times daily as needed for wheezing or shortness of breath.  04/01/19   Rory Percy, DO  cephALEXin (KEFLEX) 500 MG capsule Take 1 capsule (500 mg total) by mouth 4 (four) times daily for 5 days. 04/01/20 04/06/20  Meccariello, Bernita Raisin, DO  Fluticasone-Salmeterol (ADVAIR DISKUS) 500-50 MCG/DOSE AEPB Inhale 1 puff into the lungs 2 (two) times daily. 12/10/19   Matilde Haymaker, MD  HYDROcodone-acetaminophen (NORCO/VICODIN) 5-325 MG tablet Take 1-2 tablets by mouth every 4 (four) hours as needed. 04/02/20   Arrian Manson, Marguerita Beards, PA-C  hydrOXYzine (ATARAX/VISTARIL) 10 MG tablet Take 1 tablet (10 mg total) by mouth 3 (three) times daily as needed for anxiety. 02/09/20   Leeanne Rio, MD  montelukast (SINGULAIR) 10 MG tablet Take 1 tablet (10 mg total) by mouth at bedtime. 04/01/19   Rory Percy, DO  ondansetron (ZOFRAN) 4 MG tablet Take 1 tablet (4 mg total) by mouth every 8 (eight) hours as needed for nausea or vomiting. 04/02/20   Deno Sida, Marguerita Beards, PA-C  predniSONE (DELTASONE) 50 MG tablet Take 1 tablet (50 mg total) by mouth daily with breakfast. For 5 days 02/09/20   Leeanne Rio, MD    Family History Family History  Problem Relation Age of Onset  . Hypertension Mother   . Heart attack Paternal Grandfather   . Hypertension Maternal  Grandmother     Social History Social History   Tobacco Use  . Smoking status: Never Smoker  . Smokeless tobacco: Never Used  Substance Use Topics  . Alcohol use: Yes  . Drug use: No     Allergies   Shellfish allergy, Dilaudid [hydromorphone hcl], and Iodine   Review of Systems Review of Systems   Physical Exam Triage Vital Signs ED Triage Vitals  Enc Vitals Group     BP 04/02/20 1912 (!) 138/91     Pulse Rate 04/02/20 1912 85     Resp 04/02/20 1912 20     Temp 04/02/20 1912 98.7 F (37.1 C)     Temp Source 04/02/20 1912 Oral     SpO2 04/02/20 1912 100 %     Weight --      Height --       Head Circumference --      Peak Flow --      Pain Score 04/02/20 1911 10     Pain Loc --      Pain Edu? --      Excl. in [place]? --    No data found.  Updated Vital Signs BP (!) 138/91 (BP Location: Right Arm)   Pulse 85   Temp 98.7 F (37.1 C) (Oral)   Resp 20   LMP 03/13/2020   SpO2 100%   Visual Acuity Right Eye Distance:   Left Eye Distance:   Bilateral Distance:    Right Eye Near:   Left Eye Near:    Bilateral Near:     Physical Exam Vitals and nursing note reviewed.  Constitutional:      General: She is not in acute distress.    Appearance: She is well-developed. She is not ill-appearing.     Comments: Patient does not appear overly ill, only in pain.  HENT:     Head: Normocephalic and atraumatic.  Eyes:     Conjunctiva/sclera: Conjunctivae normal.  Cardiovascular:     Rate and Rhythm: Normal rate and regular rhythm.     Heart sounds: No murmur heard.   Pulmonary:     Effort: Pulmonary effort is normal. No respiratory distress.     Breath sounds: Normal breath sounds.  Abdominal:     Palpations: Abdomen is soft.     Tenderness: There is no abdominal tenderness. There is left CVA tenderness. There is no right CVA tenderness.     Comments: There is no additional tenderness with palpation of the abdomen, however patient reports pain in the left flank and lower abdomen abdomen  Musculoskeletal:     Cervical back: Neck supple.  Skin:    General: Skin is warm and dry.  Neurological:     Mental Status: She is alert.      UC Treatments / Results  Labs (all labs ordered are listed, but only abnormal results are displayed) Labs Reviewed  CBC - Abnormal; Notable for the following components:      Result Value   Hemoglobin 11.7 (*)    All other components within normal limits  POCT URINALYSIS DIPSTICK, ED / UC - Abnormal; Notable for the following components:   Hgb urine dipstick LARGE (*)    Leukocytes,Ua SMALL (*)    All other components within normal  limits  URINE CULTURE  BASIC METABOLIC PANEL  POC URINE PREG, ED    EKG   Radiology No results found.  Procedures Procedures (including critical care time)  Medications Ordered in UC Medications  ketorolac (TORADOL) injection 60 mg (60 mg Intramuscular Given 04/02/20 2006)  cefTRIAXone (ROCEPHIN) injection 1 g (1 g Intramuscular Given 04/02/20 2005)    Initial Impression / Assessment and Plan / UC Course  I have reviewed the triage vital signs and the nursing notes.  Pertinent labs & imaging results that were available during my care of the patient were reviewed by me and considered in my medical decision making (see chart for details).     #Complicated UTI Patient is a 44 year old presenting with likely a complicated UTI.  Reviewed urine from PCP visit, positive for leukocytes and nitrites.  Positive for leukocytes on our visit.  Also positive for blood.  Given significant CVA tenderness, concern for developing pyelonephritis.  Also consideration for stone.  Afebrile and nontachycardic here.  BMP and CBC all reassuring, with normal white count and creatinine.  60 mg Toradol given in clinic, 1 g Rocephin given, will recommend continued Keflex 4 times daily as prescribed by her primary care.  We will culture her urine.  Strict return and follow-up precautions were discussed.  Instructed her to call her primary care Monday morning to discuss her visit today as well as follow-up.  Patient verbalized understanding plan of care. Final Clinical Impressions(s) / UC Diagnoses   Final diagnoses:  Complicated UTI (urinary tract infection)     Discharge Instructions     We have sent some labs, if anything requires attention I will call you tonight or tomorrow morning  Continue the antibiotic given by your primary care as prescribed Take the pain medicine as prescribed for severe pain Take the Zofran as needed for nausea or vomiting  Plenty of water.  If pain gets significantly  worse, you develop high fever cannot tolerate oral medicines go to the emergency department.  Simply not improving by Sunday return to this clinic.  Follow-up with your primary care first thing Monday morning.      ED Prescriptions    Medication Sig Dispense Auth. Provider   HYDROcodone-acetaminophen (NORCO/VICODIN) 5-325 MG tablet Take 1-2 tablets by mouth every 4 (four) hours as needed. 6 tablet Janesha Brissette, Marguerita Beards, PA-C   ondansetron (ZOFRAN) 4 MG tablet Take 1 tablet (4 mg total) by mouth every 8 (eight) hours as needed for nausea or vomiting. 4 tablet Law Corsino, Marguerita Beards, PA-C     I have reviewed the PDMP during this encounter.   Purnell Shoemaker, PA-C 04/02/20 2102

## 2020-04-02 NOTE — Discharge Instructions (Signed)
We have sent some labs, if anything requires attention I will call you tonight or tomorrow morning  Continue the antibiotic given by your primary care as prescribed Take the pain medicine as prescribed for severe pain Take the Zofran as needed for nausea or vomiting  Plenty of water.  If pain gets significantly worse, you develop high fever cannot tolerate oral medicines go to the emergency department.  Simply not improving by Sunday return to this clinic.  Follow-up with your primary care first thing Monday morning.

## 2020-04-04 LAB — URINE CULTURE: Culture: NO GROWTH

## 2020-04-05 ENCOUNTER — Ambulatory Visit: Payer: 59 | Admitting: Family Medicine

## 2020-04-12 ENCOUNTER — Ambulatory Visit: Payer: 59 | Admitting: Family Medicine

## 2020-04-12 NOTE — Progress Notes (Deleted)
    SUBJECTIVE:   CHIEF COMPLAINT / HPI:   Pap Smear Last Pap smear 09/2010, negative Pap, does not appear to have been cotested. Patient reports no prior abnormal Pap smear***. No LMP recorded. Contraception: ***  Sexually Active: ***  Desire for STD Screening: ***  Last mammogram: *** Self breast exams: ***  Concerns: ***    PERTINENT  PMH / PSH: ***  OBJECTIVE:   There were no vitals taken for this visit.   Physical Exam: *** General: 44 y.o. female in NAD Cardio: RRR no m/r/g Lungs: CTAB, no wheezing, no rhonchi, no crackles, no IWOB on *** Abdomen: Soft, non-tender to palpation, non-distended, positive bowel sounds Skin: warm and dry Extremities: No edema GU: Pelvic exam performed with patient supine.  Chaperone in room.  Bilateral labia without abnormalities, no inguinal LAD palpated.  Cervix exhibits *** discharge, no cervix abnormalities.  No vaginal lesions.  Vaginal discharge ***.   ASSESSMENT/PLAN:   No problem-specific Assessment & Plan notes found for this encounter.     Cleophas Dunker, [place]

## 2020-04-13 ENCOUNTER — Ambulatory Visit: Payer: 59 | Admitting: Family Medicine

## 2020-04-19 ENCOUNTER — Other Ambulatory Visit: Payer: Self-pay

## 2020-04-19 ENCOUNTER — Encounter: Payer: Self-pay | Admitting: Family Medicine

## 2020-04-19 ENCOUNTER — Ambulatory Visit (INDEPENDENT_AMBULATORY_CARE_PROVIDER_SITE_OTHER): Payer: 59 | Admitting: Family Medicine

## 2020-04-19 DIAGNOSIS — Z5329 Procedure and treatment not carried out because of patient's decision for other reasons: Secondary | ICD-10-CM | POA: Insufficient documentation

## 2020-04-19 DIAGNOSIS — Z91199 Patient's noncompliance with other medical treatment and regimen due to unspecified reason: Secondary | ICD-10-CM | POA: Insufficient documentation

## 2020-04-19 NOTE — Progress Notes (Signed)
   No-show to appointment today 8/30.  Patient also no-show for appointment on 8/16 and 8/23.  Call patient at number listed in chart [phone], phone rang and eventually went to voicemail, was unable to leave a voicemail because the voicemail box was full.  Patient to call back and reschedule as needed.   Health maintenance: needs Hep C screen, COVID vaccine, Tetanus and Flu shots. Pap smear, A1c and lipid panel.  Donna Carlson, [place], PGY-3 04/19/2020 3:31 PM

## 2020-04-27 NOTE — Progress Notes (Signed)
Pt sends mychart message requesting to talk about the no show letter she received.  See Below:    It does seem that patient was late getting the appt info on 8/16 and that we should have canceled the one on 8/24.  Will make the exception this one time.  Pt made aware of policy.   Good morning. I recieved a notification that I missed 3 appointments which is incorrect. I was scheduled an appointment 8/16 but never agreed to the appointment because I didn't see the message in time. I also requested an E visit appointment with Dr.Frank for 8/23 and was told that your office no longer does E visits so then I stated that i would rather have one visit for billing reason which would have been 8/24 with Dr.Meccariello which was rescheduled to 8/30 for which I did miss. As you can see in this message forum that I didn't approve 8/16 and clearly stated I only wanted one visit for 8/24.   I also received a message saying I checked in and left  AMA which is also incorrect. Please update the errors and call me with an update. Thanks    Donna Carlson, Donna Carlson, Donna Carlson 3 weeks ago  BS We are no longer doing the virtual visits. If you would like to call the office we can get something figured out and schedule you for what the easiest for you!   Donna Carlson, Donna Carlson, Donna Carlson 3 weeks ago   I would be okay with just seeing Dr.Bailey for a physical since I am overdue. Can I just follow up with Dr.Frank as an E visit if possible. That'Carlson free correct    Donna Carlson, Donna Carlson, Donna Carlson 3 weeks ago  BS They do not have any appointments on the same day. I saw you asked to see them both so that'Carlson all I was able to do. Would you prefer to schedule one of the appointments further out. Or would you be okay with seeing one of them?   Donna Carlson, Donna Carlson, Donna Carlson 3 weeks ago   Is it possible to do both at the same time so I don't have to pay 2 different copays for the visits.    Donna Carlson,  Donna Carlson, Donna Carlson 3 weeks ago  BS Good morning. I have scheduled you for your follow up with Dr. Pilar Plate on Monday 08/23 at 8:50am. I have also scheduled you to see Dr. Sandi Carne for your pap on 08/24 at 3:10pm. Please let me know if this date and times work for you. I know they are back to back days so if you prefer to spread them out I can get you in later with Dr. Sandi Carne.  Have a great day!   Donna Carlson, Donna Carlson, Donna Carlson 3 weeks ago   Sorry just saw this. I was working. I went to urgent care and just need follow up appointment with Dr.Frank. I also need a complete Pap smear physical but prefer a woman so can Dr.Meccareiello do this for me.    Donna Carlson, Donna Carlson, Donna Carlson 3 weeks ago  BS Good morning. I have scheduled you to see Dr. Owens Shark today 04/05/20 at 10:50am. Please let me know if this date and time works for you. Thank you and have a great day!   Donna Carlson  Patient Appointment Schedule Request Pool 3 weeks ago   Appointment Request From:  Donna Carlson  With Provider: Cleophas Dunker, DO [facility]]  Preferred Date Range: 04/02/2020 - 04/05/2020  Preferred Times: Any Time  Reason for visit: Office Visit  Comments: Increased abdominal pain

## 2020-05-31 ENCOUNTER — Other Ambulatory Visit: Payer: Self-pay | Admitting: Family Medicine

## 2020-05-31 NOTE — Telephone Encounter (Signed)
Rerouting to [facility] does not refill for this practice

## 2020-06-01 MED FILL — ALBUTEROL SULFATE HFA 108 (: 108 (90 BAS | 17 days supply | Qty: 18 | Fill #0

## 2020-08-19 ENCOUNTER — Telehealth: Payer: Self-pay | Admitting: Family Medicine

## 2020-08-19 NOTE — Telephone Encounter (Signed)
Donna Carlson called the after-hours line to discuss her recent Covid positive test.  She was encouraged to speak with her provider about possible monoclonal antibody therapy.  She is currently experiencing only a mild cough and sore throat.  She is having no respiratory issues.  Her symptoms began about 2 days ago.  She is concerned about receiving monoclonal antibody therapy because of a previous experience with a Covid infection and monoclonal antibody therapy.  She was last diagnosed with a Covid infection in 11/2019.  At that time, she received bamlanivumab which caused itching and later shortness of breath.  She was ultimately assessed in the emergency room but no intubation or hospitalization was ever necessary.  She was informed that her risk factors of asthma and obesity to qualify her for monoclonal antibody therapy.  We no longer provide bamlanivumab so she would receive a different monoclonal antibody.  She was told that she would be referred to the infusion center and that she should discuss her concerns with the providers there.  If the providers from the infusion center do not appropriately address her concerns, she was encouraged to message me over my chart for further discussion.  It may be a better option to avoid monoclonal antibody therapy if that poses a greater risk of adverse reaction than her mild Covid symptoms.  In the meantime, she was encouraged to take her daily fluticasone and albuterol for wheezing.  She was encouraged to stay well-hydrated and use Tylenol as needed.  An email was sent to the [facility] Hotline with the information noted above.   Mirian Mo, MD

## 2020-11-15 ENCOUNTER — Other Ambulatory Visit: Payer: Self-pay | Admitting: Family Medicine

## 2020-11-15 DIAGNOSIS — J45909 Unspecified asthma, uncomplicated: Secondary | ICD-10-CM

## 2020-11-15 MED FILL — ADVAIR 500/50 DISKUS: 500-50 | 30 days supply | Qty: 60 | Fill #1

## 2020-11-15 MED FILL — ALBUTEROL SULFATE HFA 108 (: 108 (90 BAS | 17 days supply | Qty: 18 | Fill #0

## 2020-11-15 NOTE — Telephone Encounter (Signed)
Not our pt

## 2020-11-16 ENCOUNTER — Other Ambulatory Visit: Payer: Self-pay | Admitting: Family Medicine

## 2020-11-16 DIAGNOSIS — J45909 Unspecified asthma, uncomplicated: Secondary | ICD-10-CM

## 2020-11-19 ENCOUNTER — Other Ambulatory Visit: Payer: Self-pay | Admitting: Family Medicine

## 2020-11-19 ENCOUNTER — Encounter: Payer: Self-pay | Admitting: Family Medicine

## 2020-11-19 DIAGNOSIS — J45909 Unspecified asthma, uncomplicated: Secondary | ICD-10-CM

## 2020-11-19 MED ORDER — MONTELUKAST SODIUM 10 MG PO TABS: 10.0000 mg | ORAL_TABLET | Freq: Every day | ORAL | 3 refills | Status: DC

## 2020-11-20 ENCOUNTER — Other Ambulatory Visit (HOSPITAL_COMMUNITY): Payer: Self-pay

## 2020-11-26 ENCOUNTER — Other Ambulatory Visit (HOSPITAL_COMMUNITY): Payer: Self-pay

## 2020-11-26 MED FILL — Montelukast Sodium Tab 10 MG (Base Equiv): ORAL | 90 days supply | Qty: 90 | Fill #0 | Status: CN

## 2020-12-07 ENCOUNTER — Other Ambulatory Visit (HOSPITAL_COMMUNITY): Payer: Self-pay

## 2021-01-08 DIAGNOSIS — Z20822 Contact with and (suspected) exposure to covid-19: Secondary | ICD-10-CM | POA: Diagnosis not present

## 2021-01-09 DIAGNOSIS — Z20822 Contact with and (suspected) exposure to covid-19: Secondary | ICD-10-CM | POA: Diagnosis not present

## 2021-02-01 ENCOUNTER — Other Ambulatory Visit: Payer: Self-pay

## 2021-02-01 ENCOUNTER — Telehealth: Payer: Self-pay

## 2021-02-01 ENCOUNTER — Ambulatory Visit (HOSPITAL_COMMUNITY)
Admission: RE | Admit: 2021-02-01 | Discharge: 2021-02-01 | Disposition: A | Discharge status: Home / Self Care | Payer: 59 | Source: Ambulatory Visit | Attending: Cardiovascular Disease | Admitting: Cardiovascular Disease

## 2021-02-01 ENCOUNTER — Telehealth: Payer: Self-pay | Admitting: Cardiovascular Disease

## 2021-02-01 DIAGNOSIS — M7989 Other specified soft tissue disorders: Secondary | ICD-10-CM

## 2021-02-01 DIAGNOSIS — M79605 Pain in left leg: Secondary | ICD-10-CM | POA: Diagnosis not present

## 2021-02-01 NOTE — Telephone Encounter (Signed)
Per Dr. Burt Knack, ordered/scheduled STAT LLE venous duplex TODAY for swelling/pain.  She understands to proceed to [facility] at 1445 for check-in. Offered to schedule appointment with Dr. Burt Knack tomorrow but she declined, stating she will see results first. She was grateful for call and agrees with plan.

## 2021-02-01 NOTE — Telephone Encounter (Unsigned)
This encounter was created in error - please disregard.

## 2021-02-01 NOTE — Telephone Encounter (Signed)
Received call from [facility] technician Jinny Blossom) that patient's DVT scan is negative.

## 2021-02-01 NOTE — Telephone Encounter (Signed)
    V[facility] tech calling to give result of LE venous

## 2021-02-09 ENCOUNTER — Other Ambulatory Visit: Payer: Self-pay | Admitting: Family Medicine

## 2021-02-09 MED FILL — Montelukast Sodium Tab 10 MG (Base Equiv): ORAL | 90 days supply | Qty: 90 | Fill #0 | Status: AC

## 2021-02-10 ENCOUNTER — Other Ambulatory Visit (HOSPITAL_COMMUNITY): Payer: Self-pay

## 2021-02-13 ENCOUNTER — Emergency Department (HOSPITAL_BASED_OUTPATIENT_CLINIC_OR_DEPARTMENT_OTHER): Payer: PRIVATE HEALTH INSURANCE | Admitting: Radiology

## 2021-02-13 ENCOUNTER — Encounter (HOSPITAL_BASED_OUTPATIENT_CLINIC_OR_DEPARTMENT_OTHER): Payer: Self-pay | Admitting: *Deleted

## 2021-02-13 ENCOUNTER — Emergency Department (HOSPITAL_BASED_OUTPATIENT_CLINIC_OR_DEPARTMENT_OTHER)
Admission: EM | Admit: 2021-02-13 | Discharge: 2021-02-13 | Disposition: A | Discharge status: Home / Self Care | Payer: PRIVATE HEALTH INSURANCE | Attending: Emergency Medicine | Admitting: Emergency Medicine

## 2021-02-13 ENCOUNTER — Other Ambulatory Visit: Payer: Self-pay

## 2021-02-13 DIAGNOSIS — W228XXA Striking against or struck by other objects, initial encounter: Secondary | ICD-10-CM | POA: Diagnosis not present

## 2021-02-13 DIAGNOSIS — Y92238 Other place in hospital as the place of occurrence of the external cause: Secondary | ICD-10-CM | POA: Diagnosis not present

## 2021-02-13 DIAGNOSIS — S7002XA Contusion of left hip, initial encounter: Secondary | ICD-10-CM | POA: Diagnosis not present

## 2021-02-13 DIAGNOSIS — Z8616 Personal history of COVID-19: Secondary | ICD-10-CM | POA: Diagnosis not present

## 2021-02-13 DIAGNOSIS — J45909 Unspecified asthma, uncomplicated: Secondary | ICD-10-CM | POA: Diagnosis not present

## 2021-02-13 DIAGNOSIS — M79652 Pain in left thigh: Secondary | ICD-10-CM | POA: Insufficient documentation

## 2021-02-13 DIAGNOSIS — S79912A Unspecified injury of left hip, initial encounter: Secondary | ICD-10-CM | POA: Diagnosis present

## 2021-02-13 DIAGNOSIS — Y9389 Activity, other specified: Secondary | ICD-10-CM | POA: Diagnosis not present

## 2021-02-13 DIAGNOSIS — Y990 Civilian activity done for income or pay: Secondary | ICD-10-CM | POA: Insufficient documentation

## 2021-02-13 MED ORDER — OXYCODONE-ACETAMINOPHEN 5-325 MG PO TABS: 1.0000 | ORAL_TABLET | Freq: Four times a day (QID) | ORAL | 0 refills | Status: DC | PRN

## 2021-02-13 NOTE — ED Notes (Signed)
Pt verbalizes understanding of discharge instructions. Opportunity for questioning and answers were provided. Armand removed by staff, pt discharged from ED to home. Educated to pick up Rx.  

## 2021-02-13 NOTE — ED Triage Notes (Signed)
Pt states on Friday she had an injury while at work. 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 Took ibuprofen around 2200. Pain with ambulation and worse with laying down. Describes as throbbing.

## 2021-02-13 NOTE — ED Provider Notes (Signed)
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  2 days ago she was transporting a patient and collided with the patient's bed.  She struck her left hip and now has pain in her left hip.  She rates the pain as a 6 out of 10, worse with ambulation or lying down.  She describes the pain as throbbing.  She has taken ibuprofen without adequate relief.  She is able to ambulate.   Past Medical History:  Diagnosis Date   Asthma    COVID-19 virus infection 12/10/2019   Palpitations    Tinea pedis 01/27/2019   Toe injury, left, initial encounter 01/27/2019   UTI symptoms 04/01/2020    Past Surgical History:  Procedure Laterality Date   GALLBLADDER SURGERY     TUBAL LIGATION      Family History  Problem Relation Age of Onset   Hypertension Mother    Heart attack Paternal Grandfather    Hypertension Maternal Grandmother     Social History   Tobacco Use   Smoking status: Never   Smokeless tobacco: Never  Substance Use Topics   Alcohol use: Yes    Comment: occasional   Drug use: No    Prior to Admission medications   Medication Sig Start Date End Date Taking? Authorizing Provider  oxyCODONE-acetaminophen (PERCOCET) 5-325 MG tablet Take 1 tablet by mouth every 6 (six) hours as needed for severe pain. 02/13/21  Yes Kip Cropp, MD  albuterol (PROVENTIL) (2.5 MG/3ML) 0.083% nebulizer solution Take 3 mLs (2.5 mg total) by nebulization every 4 (four) hours as needed for wheezing or shortness of breath. 04/01/19   Myles Gip, DO  albuterol (VENTOLIN HFA) 108 (90 Base) MCG/ACT inhaler INHALE 2 PUFFS INTO THE LUNGS EVERY 4 HOURS AS NEEDED FOR WHEEZING OR SHORTNESS OF BREATH. 11/15/20 11/15/21  Matilde Haymaker, MD  Fluticasone-Salmeterol (ADVAIR) 500-50  MCG/DOSE AEPB INHALE 1 PUFF INTO THE LUNGS 2 TIMES DAILY. 12/10/19 12/09/20  Matilde Haymaker, MD  montelukast (SINGULAIR) 10 MG tablet Take 1 tablet (10 mg total) by mouth at bedtime. 11/19/20   Matilde Haymaker, MD    Allergies Shellfish allergy, Dilaudid [hydromorphone hcl], and Iodine   REVIEW OF SYSTEMS  Negative except as noted here or in the History of Present Illness.   PHYSICAL EXAMINATION  Initial Vital Signs Blood pressure 124/89, pulse 75, temperature 98.3 F (36.8 C), resp. rate 18, height 5\' 10"  (1.778 m), weight 98.9 kg, last menstrual period 01/14/2021, SpO2 100 %.  Examination General: Well-developed, well-nourished female in no acute distress; appearance consistent with age of record HENT: normocephalic; atraumatic Eyes: Normal appearance Neck: supple Heart: regular rate and rhythm Lungs: clear to auscultation bilaterally Abdomen: soft; nondistended; nontender; bowel sounds present Extremities: No deformity; tenderness of left hip and left lateral thigh; pulses normal Neurologic: Awake, alert and oriented; motor function intact in all extremities and symmetric; no facial droop Skin: Warm and dry Psychiatric: Normal mood and affect   RESULTS  Summary of this visit's results, reviewed and interpreted by myself:   EKG Interpretation  Date/Time:    Ventricular Rate:    PR Interval:    QRS Duration:   QT Interval:    QTC Calculation:   R Axis:     Text  Interpretation:          Laboratory Studies: No results found for this or any previous visit (from the past 24 hour(s)). Imaging Studies: DG Hip Unilat W or Wo Pelvis 2-3 Views Left  Result Date: 02/13/2021 CLINICAL DATA:  Left hip pain EXAM: DG HIP (WITH OR WITHOUT PELVIS) 2-3V LEFT COMPARISON:  None. FINDINGS: There is no evidence of hip fracture or dislocation. There is no evidence of arthropathy or other focal bone abnormality. IMPRESSION: Negative. Electronically Signed   By: Fidela Salisbury MD   On:  02/13/2021 05:31    ED COURSE and MDM  Nursing notes, initial and subsequent vitals signs, including pulse oximetry, reviewed and interpreted by myself.  Vitals:   02/13/21 0314 02/13/21 0315 02/13/21 0400 02/13/21 0447  BP: 124/89 124/89  121/86  Pulse: 75 79 74 71  Resp: 18   16  Temp: 98.3 F (36.8 C)   98.3 F (36.8 C)  TempSrc:    Oral  SpO2: 100% 100% 100% 100%  Weight:      Height:       Medications - No data to display  No evidence of fracture seen on radiographs.  I suspect this is contusion or deep hematoma of the soft tissue.  PROCEDURES  Procedures   ED DIAGNOSES     ICD-10-CM   1. Contusion of left hip, initial encounter  S70.02XA          Elynore Dolinski, Jenny Reichmann, MD 02/13/21 [phone]

## 2021-02-14 ENCOUNTER — Other Ambulatory Visit (HOSPITAL_COMMUNITY): Payer: Self-pay

## 2021-02-14 MED ORDER — ALBUTEROL SULFATE HFA 108 (90 BASE) MCG/ACT IN AERS
2.0000 | INHALATION_SPRAY | RESPIRATORY_TRACT | 0 refills | Status: DC | PRN
  Filled 2021-02-14: qty 18, 17d supply, fill #0

## 2021-02-16 ENCOUNTER — Other Ambulatory Visit (HOSPITAL_COMMUNITY): Payer: Self-pay

## 2021-02-16 ENCOUNTER — Other Ambulatory Visit: Payer: Self-pay

## 2021-02-16 ENCOUNTER — Ambulatory Visit (INDEPENDENT_AMBULATORY_CARE_PROVIDER_SITE_OTHER): Payer: 59 | Admitting: Family Medicine

## 2021-02-16 ENCOUNTER — Encounter: Payer: Self-pay | Admitting: Family Medicine

## 2021-02-16 DIAGNOSIS — F411 Generalized anxiety disorder: Secondary | ICD-10-CM

## 2021-02-16 DIAGNOSIS — Z6841 Body Mass Index (BMI) 40.0 and over, adult: Secondary | ICD-10-CM

## 2021-02-16 MED ORDER — FLUOXETINE HCL 20 MG PO TABS
20.0000 mg | ORAL_TABLET | Freq: Every day | ORAL | 3 refills | Status: DC
  Filled 2021-02-16: qty 30, 30d supply, fill #0

## 2021-02-16 NOTE — Assessment & Plan Note (Addendum)
Given a score of 16 on today's PHQ-9 and GAD-7 as well as the patient's symptoms of high alertness, poor sleep, constant stressed feeling, and the prolonged nature of these symptoms for the past year, a diagnosis of anxiety is likely. Management with an SSRI was recommended for the patient as a next step, along with the initiation of therapy for maximum effect. Prescribed fluoxetine and scheduled follow-up in 4 weeks. Provided a list of therapists for the patient to consider initiating CBT.

## 2021-02-16 NOTE — Assessment & Plan Note (Signed)
Counseling was provided to the patient on exercise and diet modifications as well as the GLP-1 agonist semaglutide. Side effects, benefits, efficacy, and its new role in weight loss for patients without diabetes were all discussed. Discussed the contraindication of phentermine in patients with anxiety due to its stimulant effects and addressed its lower efficacy than semaglutide. Noted the inability to prescribe phentermine at this clinic. The patient decided to look into semaglutide further on her own before making a decision. Recommended that she should return if she decides to initiate the therapy.

## 2021-02-16 NOTE — Progress Notes (Addendum)
SUBJECTIVE:   CHIEF COMPLAINT / HPI:   Anxiety The patient is a 45 year old female presenting with worsening anxiety for the last 3 months. The anxiety has gotten significantly worse in the last month. She is unable to sleep well most nights, as she wakes up around 2 or 3 am and cannot fall asleep again due to high alertness. This feeling usually lasts all day until she gets some rest. The patient reports feeling "constantly behind." Ameliorating factors include rest, eating comfort food, and massages. Aggravating factors include anything that triggers increased stress. The patient also reports increased tiredness, lower energy, and difficulty concentrating at work, which she associates with a lack of sleep. The patient denies having panic attacks.  Weight loss The patient is seeking counseling on weight loss medications and methods. She is particularly interested in phentermine because her sister is taking that medication. She has recently gained some weight, which she associates with possibly eating more due to anxiety.  PERTINENT  PMH / PSH:   History of asthma managed with albuterol PRN, daily fluoxetine, fluticasone-salmeterol PRN, and daily montelukast.  The patient reports drinking little alcohol but eating increased amounts of high-calorie comfort food. 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 Her job is a major stressor in her life.  OBJECTIVE:   BP 123/80   Pulse 75   Ht 5' (1.524 m)   Wt 218 lb 9.6 oz (99.2 kg)   LMP 02/16/2021   SpO2 99%   BMI 42.69 kg/m   General: Moderately anxious-appearing adult Pulmonary: No wheezes or crackles bilaterally Cardiovascular: RRR, no murmurs, rubs, or gallops  ASSESSMENT/PLAN:   GAD (generalized anxiety disorder) Given a score of 16 on today's PHQ-9 and GAD-7 as well as the patient's symptoms of high alertness, poor sleep, constant  stressed feeling, and the prolonged nature of these symptoms for the past year, a diagnosis of anxiety is likely. Management with an SSRI was recommended for the patient as a next step, along with the initiation of therapy for maximum effect. Prescribed fluoxetine and scheduled follow-up in 4 weeks. Provided a list of therapists for the patient to consider initiating CBT.  Obesity Counseling was provided to the patient on exercise and diet modifications as well as the GLP-1 agonist semaglutide. Side effects, benefits, efficacy, and its new role in weight loss for patients without diabetes were all discussed. Discussed the contraindication of phentermine in patients with anxiety due to its stimulant effects and addressed its lower efficacy than semaglutide. Noted the inability to prescribe phentermine at this clinic. The patient decided to look into semaglutide further on her own before making a decision. Recommended that she should return if she decides to initiate the therapy.   Dimitry Mining engineer, [place]    I was present for the medical decision making and physical exam of the above-noted encounter.  I agree with the above documentation with the following additions:  General anxiety disorder This current episode seems to have been going on for the past 3-4 months although, I did see her for the same issue 1 year ago.  At this time, I think it is very appropriate to consider pharmacotherapy. -Start Prozac 20 mg -Return to clinic in 4 weeks -List of therapists provided  Obesity She was advised against phentermine use based on the availability of better  pharmaceuticals.  I also think that phentermine would be a poor choice in the setting of other mood related issues.  Counseled on GLP-1 use.  She did not want to make a decision today and preferred to go home to do more of her own reading.  She was encouraged to return to clinic if she did want to start a  GLP-1 medication.

## 2021-02-16 NOTE — Patient Instructions (Signed)
It was great to see you today.  Here is a quick review of the things we talked about:  Anxiety:This is been more difficult for you for the past several months.  Based on what you told me today, I think it is very reasonable to start a medication.  I have sent Prozac (fluoxetine) to your pharmacy.  Like you to start taking this daily.  I like you to come back to clinic in 4 weeks and we will see how you are doing.  Additionally, I think it would be helpful to reach out to her therapist as well.  See the information below for therapist in the community.  Weight loss: Phentermine has fallen out of favor as a weight loss medication.  Most physicians are on board with a new medication called semaglutide.  This is in the class of medication called GLP-1 agonist.  I am a big fan of this once weekly injection.  I recommend they do a little bit of your own reading.  Feel free to return to clinic if you would like to start this medication.    Therapy and Counseling Resources Most providers on this list will take Medicaid. Patients with commercial insurance or Medicare should contact their insurance company to get a list of in network providers.  [facility]:Psychiatry and Counseling [address] [place], [place] [address] [place], [place], [place] [address]      [place] [address]  [place], [place] [address] [phone]  [place] [address], [place]  [place], [place] [address]       [phone]     [facility] (virtual only) [phone]  [facility] [address] [place], [place]  [facility]:  [place]. [place] [phone]  [facility]:  [address] [place] [phone]  [facility] (under & uninsured) [address] [place]  [phone]    [email or web address]    [place] [address]  [place]    [phone]  [facility] [place]     Phone:  [phone]     [place]  [place] [address]      [place], [place] ext [place]: [place], [place], [place]   [place] ([place] therapist) [email or web address]  [place] [address]   [place] [address]    [phone]    [facility]   [address],  [place], [place]   [place]  [place]  [facility]  [address] [place], [place]        [phone]  Open Access/Walk In Clinic under & uninsured  [facility]  [address] [place], [place] Crisis [phone]  [facility] [place],  ([place])   [place], [place]: [phone]) 8:30 - 12; 1 - 2:30  [facility] [place],  [place], [place]    [phone]):8:30 - 12; 2 - 3PM  [facility] [employer],  [address]  [place]; [phone]):  Mon - Fri 8 AM - 5 PM  [facility] [place]  MWF 12:30 to 3:00 or call to schedule an appointment  [phone]  Specific Provider options Psychology Today  [email or web address] click on find a therapist  enter your zip code left side and select or tailor a therapist for your specific need.   [facility] Provider Directory [email or web address]  (Medicaid)   Follow all drop down to find a provider  [place] or [email or web address] [address], [place], [place] Recovery support and educational    24- Hour Availability:   [facility]  [address] [place], [place] Crisis [phone]  [facility] [employer] [phone]  [place]  [phone]   [place]  [phone] (after hours)  [facility]rapeutic [facility]/Mobile Crisis   [phone]  [place] National Suicide Hotline  (904)198-0210 Diamantina Monks)  Call 911 or go to emergency room  [facility]  [phone]);  [place] and [employer]  [phone]); [place], [place], [place], [place], [place], [place], [place]

## 2021-02-22 ENCOUNTER — Other Ambulatory Visit (HOSPITAL_COMMUNITY): Payer: Self-pay

## 2021-02-22 MED ORDER — TRAMADOL HCL 50 MG PO TABS
50.0000 mg | ORAL_TABLET | Freq: Three times a day (TID) | ORAL | 0 refills | Status: DC | PRN
  Filled 2021-02-22: qty 30, 10d supply, fill #0

## 2021-02-24 ENCOUNTER — Ambulatory Visit: Payer: PRIVATE HEALTH INSURANCE | Attending: Occupational Medicine

## 2021-02-24 ENCOUNTER — Other Ambulatory Visit: Payer: Self-pay

## 2021-02-24 DIAGNOSIS — M25652 Stiffness of left hip, not elsewhere classified: Secondary | ICD-10-CM | POA: Diagnosis present

## 2021-02-24 DIAGNOSIS — R262 Difficulty in walking, not elsewhere classified: Secondary | ICD-10-CM | POA: Diagnosis present

## 2021-02-24 DIAGNOSIS — M6281 Muscle weakness (generalized): Secondary | ICD-10-CM | POA: Insufficient documentation

## 2021-02-24 DIAGNOSIS — M25552 Pain in left hip: Secondary | ICD-10-CM | POA: Insufficient documentation

## 2021-02-24 NOTE — Therapy (Signed)
[place], [place], [address] Phone: [phone]   Fax:  [phone]  Physical Therapy Evaluation  Patient Details  Name: Donna Carlson MRN: [id number] Date of Birth: [birth year] Referring Provider (PT): Lossie Faes   Encounter Date: 02/24/2021   PT End of Session - 02/24/21 1014     Visit Number 1    Number of Visits 9    Date for PT Re-Evaluation 03/26/21    Authorization Type Workers Comp    Authorization - Visit Number 1    Authorization - Number of Visits 6    PT Start Time 1014    PT Stop Time 1109   ice pack 10 minutes at end of session   PT Time Calculation (min) 55 min    Activity Tolerance Patient tolerated treatment well    Behavior During Therapy [facility] for tasks assessed/performed             Past Medical History:  Diagnosis Date   Asthma    COVID-19 virus infection 12/10/2019   Palpitations    Tinea pedis 01/27/2019   Toe injury, left, initial encounter 01/27/2019   UTI symptoms 04/01/2020    Past Surgical History:  Procedure Laterality Date   GALLBLADDER SURGERY     TUBAL LIGATION      There were no vitals filed for this visit.    Subjective Assessment - 02/24/21 1014     Subjective Patient reports she was moving a patient on the ventilator and went into the cath lab and the nurse tech yelled "wait" thinking that the IV pole wouldn't fit through the door, so she tried to stop the bed and the bed rammed into her left lateral hip. She did not experience immediate pain, but began having an aching pain later that evening. She took OTC pain meds that did not help. Her pain was worse the next morning. The injury occured on 02/11/21. She reports the pain is not as excruciating, but is still a constant ache. She works 12 hour shifts and has worked a couple days since the injury on light duty and by the middle of her shift it is hurting more. 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 Patient denies any previous injury to the hip. She reports previous history of low back pain and received PT for this about 8-9 years ago. She denies any numbness/tingling. She was given tramodol, but has not taken that due to side effect of shortness of breath.    Limitations Sitting;Lifting;Standing;Walking    How long can you sit comfortably? about 20 minutes    How long can you stand comfortably? maybe 30 minutes    How long can you walk comfortably? maybe 10-15 minutes    Diagnostic tests Hip X-ray: negative    Patient Stated Goals To learn some stretching to do at work, be comfortable with walking at work, she feels like she is walking in a tense manner    Currently in Pain? Yes    Pain Score 4     Pain Location Hip    Pain Orientation Left    Pain Descriptors / Indicators Dull;Aching    Pain Type Acute pain    Pain Radiating Towards lateral thigh    Pain Onset 1 to 4 weeks ago    Pain Frequency Intermittent    Aggravating Factors  sitting, walking, standing  Pain Relieving Factors stretching, heat    Effect of Pain on Daily Activities unable to complete full work duty                [facility] PT Assessment - 02/24/21 0001       Assessment   Medical Diagnosis S70.02XA (ICD-10-CM) - Contusion of left hip, initial encounter    Referring Provider (PT) Dannial Monarch R    Onset Date/Surgical Date 02/11/21    Hand Dominance Right    Next MD Visit 03/04/21    Prior Therapy PT for her back      Precautions   Precautions None      Restrictions   Weight Bearing Restrictions No      Balance Screen   Has the patient fallen in the past 6 months No      [place] residence    Living Arrangements Children    Additional Comments stairs to enter      Prior Function   Level of Independence Independent    Vocation Part time employment    Vocation Requirements see  subjective (currently on light duty)      Cognition   Overall Cognitive Status Within Functional Limits for tasks assessed      Observation/Other Assessments   Observations shifted to the Rt in sitting    Focus on Therapeutic Outcomes (FOTO)  49% function to 66% predicted      Sensation   Light Touch Not tested      Coordination   Gross Motor Movements are Fluid and Coordinated Yes      AROM   Right Hip Flexion 100    Left Hip Flexion 90   pain lateral hip   Left Hip External Rotation  --   WNL pain lateral hip   Left Hip Internal Rotation  --   WNL pain lateral hip   Left Hip ABduction --   WNL lateral hip pain   Left Hip ADduction --   WNL lateral hip pain     PROM   Overall PROM Comments pain along lateral hip at available end range of all planes left      Strength   Right Hip Flexion 5/5    Right Hip Extension 5/5    Right Hip External Rotation  5/5    Right Hip Internal Rotation 5/5    Right Hip ABduction 5/5    Right Hip ADduction 5/5    Left Hip Flexion 4/5    Left Hip Extension 4-/5    Left Hip External Rotation 4+/5    Left Hip Internal Rotation 4-/5    Left Hip ABduction 3/5    Left Hip ADduction 5/5    Right Knee Flexion 5/5    Right Knee Extension 5/5    Left Knee Flexion 4-/5    Left Knee Extension 5/5      Flexibility   Hamstrings WNL bilateral    Quadriceps tightness bilateral    ITB not assessed    Piriformis WNL bilateral      Palpation   Palpation comment TTP Lt greater trochanter, TFL, glute med      Special Tests   Other special tests FABER causes pain along lateral hip FADIR causes pain along lateral hip      Ambulation/Gait   Ambulation/Gait Yes    Gait Comments limited hip extension/flexion throughout gait cycle on the LLE  Objective measurements completed on examination: See above findings.       [place] Adult PT Treatment/Exercise - 02/24/21 0001       Self-Care   Self-Care Other  Self-Care Comments    Other Self-Care Comments  see patient education      Modalities   Modalities Cryotherapy      Cryotherapy   Number Minutes Cryotherapy 10 Minutes    Cryotherapy Location Hip    Type of Cryotherapy Ice pack      Manual Therapy   Manual Therapy Taping    Passive ROM gentle PROM to Lt hip in all planes to tolerance    Kinesiotex --   Star pattern over greater trochanter for pain reduction                   PT Education - 02/24/21 1029     Education Details education on current condition, POC, HEP, modalities for pain control    Person(s) Educated Patient    Methods Explanation;Demonstration;Verbal cues;Handout;Tactile cues    Comprehension Verbalized understanding;Returned demonstration              PT Short Term Goals - 02/24/21 1030       PT SHORT TERM GOAL #1   Title Patient will be independent with initial HEP    Baseline issued at eval    Time 1    Period Weeks    Status New    Target Date 03/03/21               PT Long Term Goals - 02/24/21 1102       PT LONG TERM GOAL #1   Title Patient will self-report tolerating at least 30 minutes of walking activity.    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     Baseline unable to complete patient transfers    Time 4    Period Weeks    Status New    Target Date 03/24/21      PT LONG TERM GOAL #5   Title Patient will score at least 66% function on FOTO to signify clinically meaningful improvement in functional abilities.    Baseline 49%    Time 4    Period Weeks    Status New    Target Date 03/24/21                    Plan - 02/24/21 1104     Clinical  Impression Statement Patient is a 45 y/o female who presents to OPPT with chief complaint of acute left lateral hip pain that occured on 02/11/21 when she was transporting a patient to the cath lab and the hospital bed rammed into the side of her hip. She reports the pain is a dull ache about the lateral hip that is constant and worsens throughout the day. Her signs and symptoms are consistent with trochanteric bursitis as she has significant palpable tenderness about greater trochanter, TFL, and glute med and is unable to lay on the left hip due to pain. Overall she has good ROM about the left hip with mild limitation into hip flexion, though reports pain with AROM in all planes. She has weakness about the Lt hip and knee with the most significant weakness noted in her hip abductors. She will benefit from skilled PT to address the above stated deficits in order to return to [facility] and full work duty.    Personal Factors and Comorbidities Profession    Examination-Activity Limitations Lift;Bend;Carry;Stand;Stairs;Locomotion Level;Sit;Squat    Examination-Participation Restrictions Occupation    Stability/Clinical Decision Making Stable/Uncomplicated    Clinical Decision Making Low    Rehab Potential Excellent    PT Frequency 2x / week    PT Duration 4 weeks    PT Treatment/Interventions ADLs/Self Care Home Management;Cryotherapy;Electrical Stimulation;Iontophoresis 4mg /ml Dexamethasone;Moist Heat;Ultrasound;Stair training;Therapeutic activities;Therapeutic exercise;Balance  training;Neuromuscular re-education;Gait training;Patient/family education;Manual techniques;Passive range of motion;Dry needling;Taping    PT Next Visit Plan review FOTO, review HEP, consider ionto or other modalities for pain control, hip isometrics    PT Home Exercise Plan Access Code [email or web address]    Consulted and Agree with Plan of Care Patient             Patient will benefit from skilled therapeutic intervention in order to improve the following deficits and impairments:  Decreased range of motion, Difficulty walking, Decreased activity tolerance, Pain, Decreased strength  Visit Diagnosis: Pain in left hip  Stiffness of left hip, not elsewhere classified  Muscle weakness (generalized)  Difficulty in walking, not elsewhere classified     Problem List Patient Active Problem List   Diagnosis Date Noted   GAD (generalized anxiety disorder) 02/16/2021   Shortness of breath 03/29/2020   Stress and adjustment reaction 02/14/2020   Allergic rhinitis 06/04/2015   Obesity 01/02/2008   Asthma, mild persistent 10/18/2006   Gwendolyn Grant, PT, DPT, ATC 02/24/21 2:21 PM   [facility] [address] [place], [place], [address] Phone: [phone]   Fax:  [phone]  Name: Donna Carlson MRN: [id number] Date of Birth: [birth year]

## 2021-02-28 ENCOUNTER — Other Ambulatory Visit: Payer: Self-pay

## 2021-02-28 ENCOUNTER — Ambulatory Visit: Payer: PRIVATE HEALTH INSURANCE | Attending: Occupational Medicine

## 2021-02-28 DIAGNOSIS — M6281 Muscle weakness (generalized): Secondary | ICD-10-CM | POA: Diagnosis present

## 2021-02-28 DIAGNOSIS — M25552 Pain in left hip: Secondary | ICD-10-CM | POA: Diagnosis not present

## 2021-02-28 DIAGNOSIS — R262 Difficulty in walking, not elsewhere classified: Secondary | ICD-10-CM | POA: Diagnosis present

## 2021-02-28 DIAGNOSIS — M25652 Stiffness of left hip, not elsewhere classified: Secondary | ICD-10-CM | POA: Insufficient documentation

## 2021-02-28 NOTE — Therapy (Signed)
[place], [place], [address] Phone: [phone]   Fax:  [phone]  Physical Therapy Treatment  Patient Details  Name: Donna Carlson MRN: [id number] Date of Birth: [birth year] Referring Provider (PT): Lossie Faes   Encounter Date: 02/28/2021   PT End of Session - 02/28/21 0713     Visit Number 2    Number of Visits 9    Date for PT Re-Evaluation 03/26/21    Authorization Type Workers Comp    Authorization - Visit Number 2    Authorization - Number of Visits 6    PT Start Time 0715    PT Stop Time 0810   ice pack at end of session   PT Time Calculation (min) 55 min    Activity Tolerance Patient tolerated treatment well    Behavior During Therapy [facility] for tasks assessed/performed             Past Medical History:  Diagnosis Date   Asthma    COVID-19 virus infection 12/10/2019   Palpitations    Tinea pedis 01/27/2019   Toe injury, left, initial encounter 01/27/2019   UTI symptoms 04/01/2020    Past Surgical History:  Procedure Laterality Date   GALLBLADDER SURGERY     TUBAL LIGATION      There were no vitals filed for this visit.   Subjective Assessment - 02/28/21 0717     Subjective "It's feeling better. It doesn't feel as swollen. I did have to take some pain medicine Saturday night."    Limitations Sitting;Lifting;Standing;Walking    How long can you sit comfortably? about 20 minutes    How long can you stand comfortably? maybe 30 minutes    How long can you walk comfortably? maybe 10-15 minutes    Diagnostic tests Hip X-ray: negative    Patient Stated Goals To learn some stretching to do at work, be comfortable with walking at work, she feels like she is walking in a tense manner    Currently in Pain? Yes    Pain Score 2     Pain Location Hip    Pain Orientation Left;Lateral    Pain Descriptors / Indicators Aching;Dull    Pain Type Acute pain    Pain Onset 1 to 4 weeks ago                                [facility] Adult PT Treatment/Exercise - 02/28/21 0001       Self-Care   Other Self-Care Comments  see patient education      Knee/Hip Exercises: Supine   Hip Adduction Isometric 10 reps    Hip Adduction Isometric Limitations x2    Other Supine Knee/Hip Exercises hip abduction isometric 2 x 10; 5 sec hold    Other Supine Knee/Hip Exercises glute set 2 x 10; 5 sec hold      Cryotherapy   Number Minutes Cryotherapy 10 Minutes    Cryotherapy Location Hip    Type of Cryotherapy Ice pack      Manual Therapy   Joint Mobilization inferior and posterior hip mobilization grade II-III    Soft tissue mobilization gentle STM to Lt lateral hip musuclature    Passive ROM PROM to Lt hip in all planes to tolerance                    PT Education - 02/28/21 5035  Education Details Updated HEP. FOTO score and anticipated progress.    Person(s) Educated Patient    Methods Explanation;Verbal cues;Handout;Demonstration;Tactile cues    Comprehension Verbalized understanding;Returned demonstration;Verbal cues required;Tactile cues required              PT Short Term Goals - 02/24/21 1030       PT SHORT TERM GOAL #1   Title Patient will be independent with initial HEP    Baseline issued at eval    Time 1    Period Weeks    Status New    Target Date 03/03/21               PT Long Term Goals - 02/24/21 1102       PT LONG TERM GOAL #1   Title Patient will self-report tolerating at least 30 minutes of walking activity.    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    Baseline unable to complete patient transfers    Time 4    Period Weeks    Status New    Target Date 03/24/21      PT LONG TERM GOAL #5   Title Patient will score at least 66% function on FOTO to signify clinically meaningful improvement in functional abilities.    Baseline 49%    Time 4    Period Weeks    Status New    Target Date 03/24/21                   Plan - 02/28/21 0743     Clinical Impression Statement Patient arrives with a reduction in left lateral hip pain compared to initial evaluation. She tolerates manual therapy well reporting mild increased pain at end range of all PROM of the Lt hip. Able to introduce hip isometric strengthening without increased pain.    Personal Factors and Comorbidities Profession    Examination-Activity Limitations Lift;Bend;Carry;Stand;Stairs;Locomotion Level;Sit;Squat    Examination-Participation Restrictions Occupation    Stability/Clinical Decision Making Stable/Uncomplicated    Rehab Potential Excellent    PT Frequency 2x / week    PT Duration 4 weeks    PT Treatment/Interventions ADLs/Self Care Home Management;Cryotherapy;Electrical Stimulation;Iontophoresis 4mg /ml Dexamethasone;Moist Heat;Ultrasound;Stair training;Therapeutic activities;Therapeutic exercise;Balance training;Neuromuscular re-education;Gait training;Patient/family education;Manual techniques;Passive range of motion;Dry needling;Taping    PT Next Visit Plan ionto pending MD sign off on POC; hip PROM to tolerance, progress hip strengthening as tolerated    PT Home Exercise Plan Access Code [facility]  Consulted and Agree with Plan of Care Patient             Patient will benefit from skilled therapeutic intervention in order to improve the following deficits and impairments:  Decreased range of motion, Difficulty walking, Decreased activity tolerance, Pain, Decreased strength  Visit Diagnosis: Pain in left hip  Stiffness of left hip, not elsewhere classified  Muscle weakness (generalized)  Difficulty in walking, not elsewhere classified     Problem List Patient Active Problem List   Diagnosis Date Noted   GAD (generalized anxiety disorder) 02/16/2021   Shortness of breath 03/29/2020   Stress and adjustment reaction 02/14/2020   Allergic rhinitis 06/04/2015   Obesity 01/02/2008   Asthma, mild persistent 10/18/2006   Gwendolyn Grant, PT, DPT, ATC 02/28/21 8:07 AM   [facility] [address] [place], [place], [address] Phone: [phone]   Fax:  [phone]  Name: KAMEE BOBST MRN: [id number] Date of Birth: 1976/03/30

## 2021-03-03 ENCOUNTER — Ambulatory Visit: Payer: PRIVATE HEALTH INSURANCE | Admitting: Physical Therapy

## 2021-03-03 ENCOUNTER — Other Ambulatory Visit: Payer: Self-pay

## 2021-03-03 ENCOUNTER — Encounter: Payer: Self-pay | Admitting: Physical Therapy

## 2021-03-03 DIAGNOSIS — M25552 Pain in left hip: Secondary | ICD-10-CM | POA: Diagnosis not present

## 2021-03-03 DIAGNOSIS — M25652 Stiffness of left hip, not elsewhere classified: Secondary | ICD-10-CM

## 2021-03-03 DIAGNOSIS — M6281 Muscle weakness (generalized): Secondary | ICD-10-CM

## 2021-03-03 NOTE — Therapy (Signed)
[place], [place], [address] Phone: [phone]   Fax:  [phone]  Physical Therapy Treatment  Patient Details  Name: Donna Carlson MRN: [id number] Date of Birth: Nov 05, 1975 Referring Provider (PT): Lossie Faes   Encounter Date: 03/03/2021   PT End of Session - 03/03/21 0939     Visit Number 3    Number of Visits 9    Date for PT Re-Evaluation 03/26/21    Authorization Type Workers Comp    Authorization - Visit Number 3    Authorization - Number of Visits 6    PT Start Time 0934    PT Stop Time 1012    PT Time Calculation (min) 38 min             Past Medical History:  Diagnosis Date   Asthma    COVID-19 virus infection 12/10/2019   Palpitations    Tinea pedis 01/27/2019   Toe injury, left, initial encounter 01/27/2019   UTI symptoms 04/01/2020    Past Surgical History:  Procedure Laterality Date   GALLBLADDER SURGERY     TUBAL LIGATION      There were no vitals filed for this visit.   Subjective Assessment - 03/03/21 0937     Subjective 5/10, a little better. Like a dull ache in the hip.    Currently in Pain? Yes    Pain Score 5     Pain Location Hip    Pain Orientation Left    Pain Descriptors / Indicators Dull;Aching    Pain Type Acute pain    Aggravating Factors  sitting, walking, standing    Pain Relieving Factors stretching,  ice                               [facility] Adult PT Treatment/Exercise - 03/03/21 0001       Knee/Hip Exercises: Stretches   Other Knee/Hip Stretches SKTC, LTR, FIgure 4 , mod thomas      Knee/Hip Exercises: Supine   Hip Adduction Isometric 10 reps    Hip Adduction Isometric Limitations x2    Other Supine Knee/Hip Exercises hip abduction isometric 2 x 10; 5 sec hold    Other Supine Knee/Hip Exercises glut set x 10      Modalities   Modalities Iontophoresis      Iontophoresis   Type of Iontophoresis Dexamethasone     Location left lateral hip    Dose 1 ml    Time 6 hour slow release patch      Manual Therapy   Passive ROM PROM to Lt hip in all planes to tolerance                    PT Education - 03/03/21 1028     Education Details Ionto precautions    Person(s) Educated Patient    Methods Explanation    Comprehension Verbalized understanding              PT Short Term Goals - 03/03/21 1052       PT SHORT TERM GOAL #1   Title Patient will be independent with initial HEP    Baseline pt demonstrates independence    Time 1    Period Weeks    Status Achieved    Target Date 03/03/21               PT Long Term  Goals - 02/24/21 1102       PT LONG TERM GOAL #1   Title Patient will self-report tolerating at least 30 minutes of walking activity.    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    Baseline unable to complete patient transfers    Time 4    Period Weeks    Status New    Target Date 03/24/21      PT LONG TERM GOAL #5   Title Patient will score at least 66% function on FOTO to signify clinically meaningful improvement in functional abilities.    Baseline 49%    Time 4    Period Weeks    Status New    Target Date 03/24/21                    Plan - 03/03/21 1001     Clinical Impression Statement pt reports overall a little improvement in pain. She reports compliance with HEP stretches and isometrics. Reviewed full HEP with c/o min increased pain with isometrics. Trial of Ionto patch to lateral left hip with review of precautions. Will assess response next visit.    PT Next Visit Plan assess response to ionto patch ; hip PROM to tolerance, progress hip strengthening as tolerated    PT Home Exercise Plan Access Code [facility]             Patient will benefit from skilled therapeutic intervention in order to improve the following deficits and impairments:  Decreased range of motion, Difficulty walking, Decreased activity tolerance, Pain, Decreased strength  Visit Diagnosis: Pain in left hip  Muscle weakness (generalized)  Stiffness of left hip, not elsewhere classified     Problem List Patient Active Problem List   Diagnosis Date Noted   GAD (generalized anxiety disorder) 02/16/2021   Shortness of breath 03/29/2020   Stress and adjustment reaction 02/14/2020   Allergic rhinitis 06/04/2015   Obesity 01/02/2008   Asthma, mild persistent 10/18/2006    Dorene Ar, PTA 03/03/2021, 10:54 AM  [facility] [address] [place], [place], [address] Phone: [phone]   Fax:  [phone]  Name: Donna Carlson MRN: [id number] Date of Birth: 1976/03/18

## 2021-03-03 NOTE — Patient Instructions (Signed)

## 2021-03-04 ENCOUNTER — Other Ambulatory Visit (HOSPITAL_COMMUNITY): Payer: Self-pay

## 2021-03-04 MED ORDER — METHYLPREDNISOLONE 4 MG PO TBPK
ORAL_TABLET | ORAL | 0 refills | Status: DC
  Filled 2021-03-04: qty 21, 6d supply, fill #0

## 2021-03-07 ENCOUNTER — Other Ambulatory Visit: Payer: Self-pay

## 2021-03-07 ENCOUNTER — Ambulatory Visit: Payer: PRIVATE HEALTH INSURANCE

## 2021-03-07 DIAGNOSIS — M25652 Stiffness of left hip, not elsewhere classified: Secondary | ICD-10-CM

## 2021-03-07 DIAGNOSIS — M25552 Pain in left hip: Secondary | ICD-10-CM | POA: Diagnosis not present

## 2021-03-07 DIAGNOSIS — M6281 Muscle weakness (generalized): Secondary | ICD-10-CM

## 2021-03-07 DIAGNOSIS — R262 Difficulty in walking, not elsewhere classified: Secondary | ICD-10-CM

## 2021-03-07 NOTE — Therapy (Signed)
[place], [place], [address] Phone: [phone]   Fax:  [phone]  Physical Therapy Treatment  Patient Details  Name: Donna Carlson MRN: [id number] Date of Birth: 16-Dec-1975 Referring Provider (PT): Lossie Faes   Encounter Date: 03/07/2021   PT End of Session - 03/07/21 1135     Visit Number 4    Number of Visits 9    Date for PT Re-Evaluation 03/26/21    Authorization Type Workers Comp    Authorization - Visit Number 4    Authorization - Number of Visits 6    PT Start Time 0718    PT Stop Time 0808   ice pack to Left hip at end of session   PT Time Calculation (min) 50 min    Activity Tolerance Patient tolerated treatment well;Patient limited by pain    Behavior During Therapy [facility] for tasks assessed/performed             Past Medical History:  Diagnosis Date   Asthma    COVID-19 virus infection 12/10/2019   Palpitations    Tinea pedis 01/27/2019   Toe injury, left, initial encounter 01/27/2019   UTI symptoms 04/01/2020    Past Surgical History:  Procedure Laterality Date   GALLBLADDER SURGERY     TUBAL LIGATION      There were no vitals filed for this visit.   Subjective Assessment - 03/07/21 1136     Subjective Patient reports she continues to have a constant ache in her hip. No difference noted in her pain with ionto.    Currently in Pain? Yes    Pain Score 5     Pain Location Hip    Pain Orientation Left;Lateral    Pain Descriptors / Indicators Aching;Dull    Pain Type Acute pain    Pain Onset 1 to 4 weeks ago    Pain Frequency Constant                               [facility] Adult PT Treatment/Exercise - 03/07/21 0001       Self-Care   Other Self-Care Comments  see patient education      Knee/Hip Exercises: Stretches   Passive Hamstring Stretch 60 seconds    Passive Hamstring Stretch Limitations left; seated    Other Knee/Hip Stretches SKTC 2 x  30 sec; LTR with figure 4 ,1 min each      Knee/Hip Exercises: Supine   Bridges Limitations attempted; pain    Other Supine Knee/Hip Exercises supine march 2  x10    Other Supine Knee/Hip Exercises hip abduction red band 2 x 10; partial range      Knee/Hip Exercises: Prone   Hip Extension Limitations 2 x 8; left      Cryotherapy   Number Minutes Cryotherapy 10 Minutes    Cryotherapy Location Hip    Type of Cryotherapy Ice pack      Manual Therapy   Joint Mobilization inferior and posterior hip mobilization grade II-III; LAD x 1 min Lt    Soft tissue mobilization STM to posterior gluteal musculature    Passive ROM PROM to Lt hip in all planes to tolerance                    PT Education - 03/07/21 1134     Education Details Issued tennis ball for self-soft tissue mobilization. Added resisted  hip abduction to HEP    Person(s) Educated Patient    Methods Explanation;Verbal cues;Demonstration    Comprehension Verbalized understanding;Returned demonstration;Verbal cues required              PT Short Term Goals - 03/03/21 1052       PT SHORT TERM GOAL #1   Title Patient will be independent with initial HEP    Baseline pt demonstrates independence    Time 1    Period Weeks    Status Achieved    Target Date 03/03/21               PT Long Term Goals - 02/24/21 1102       PT LONG TERM GOAL #1   Title Patient will self-report tolerating at least 30 minutes of walking activity.    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    Baseline unable to complete patient transfers    Time 4    Period Weeks    Status New    Target Date 03/24/21      PT LONG TERM GOAL #5   Title Patient will score at least 66% function on FOTO to signify clinically meaningful improvement in functional abilities.    Baseline 49%    Time 4    Period Weeks    Status New    Target Date 03/24/21                   Plan - 03/07/21 1139     Clinical Impression Statement Patient reports no change in her hip pain with ionto patch that was administered at last session. She tolerates manual therapy well reporting no increased pain with hip PROM. She has significant tautness and palpable tenderness about posterior gluteal musculature with partial release from manual therapy. Issued tennis ball for patient to complete self-soft tissue mobilization. Able to introduce resisted hip abduction through partial range without increased pain. Unable to complete hip bridge due to increased pain, though tolerates prone hip extension well.    PT Treatment/Interventions ADLs/Self Care Home Management;Cryotherapy;Electrical Stimulation;Iontophoresis 4mg /ml Dexamethasone;Moist Heat;Ultrasound;Stair training;Therapeutic activities;Therapeutic exercise;Balance training;Neuromuscular re-education;Gait training;Patient/family education;Manual techniques;Passive range of motion;Dry needling;Taping    PT Next Visit Plan hip PROM to tolerance, progress hip strengthening as tolerated    PT Home Exercise Plan Access Code [email or web address]    Consulted and Agree with Plan of Care Patient  Patient will benefit from skilled therapeutic intervention in order to improve the  following deficits and impairments:  Decreased range of motion, Difficulty walking, Decreased activity tolerance, Pain, Decreased strength  Visit Diagnosis: Pain in left hip  Muscle weakness (generalized)  Difficulty in walking, not elsewhere classified  Stiffness of left hip, not elsewhere classified     Problem List Patient Active Problem List   Diagnosis Date Noted   GAD (generalized anxiety disorder) 02/16/2021   Shortness of breath 03/29/2020   Stress and adjustment reaction 02/14/2020   Allergic rhinitis 06/04/2015   Obesity 01/02/2008   Asthma, mild persistent 10/18/2006   Gwendolyn Grant, PT, DPT, ATC 03/07/21 11:43 AM  [facility] [address] [place], [place], [address] Phone: [phone]   Fax:  [phone]  Name: WINDI TORO MRN: [id number] Date of Birth: [birth year]

## 2021-03-10 ENCOUNTER — Ambulatory Visit: Payer: PRIVATE HEALTH INSURANCE

## 2021-03-14 ENCOUNTER — Other Ambulatory Visit: Payer: Self-pay

## 2021-03-14 ENCOUNTER — Ambulatory Visit: Payer: PRIVATE HEALTH INSURANCE | Attending: Occupational Medicine

## 2021-03-14 DIAGNOSIS — M6281 Muscle weakness (generalized): Secondary | ICD-10-CM

## 2021-03-14 DIAGNOSIS — M25552 Pain in left hip: Secondary | ICD-10-CM | POA: Diagnosis not present

## 2021-03-14 DIAGNOSIS — R262 Difficulty in walking, not elsewhere classified: Secondary | ICD-10-CM

## 2021-03-14 DIAGNOSIS — M25652 Stiffness of left hip, not elsewhere classified: Secondary | ICD-10-CM

## 2021-03-14 NOTE — Therapy (Signed)
[place], [place], [address] Phone: [phone]   Fax:  [phone]  Physical Therapy Treatment/Re-evaluation  Patient Details  Name: Donna Carlson MRN: [id number] Date of Birth: 04/12/1976 Referring Provider (PT): Lossie Faes   Encounter Date: 03/14/2021   PT End of Session - 03/14/21 0718     Visit Number 5    Number of Visits 11    Date for PT Re-Evaluation 04/09/21    Authorization Type Workers Comp    Authorization - Visit Number 5    Authorization - Number of Visits 6    PT Start Time 0719    PT Stop Time 0809   ice at end of session 10 minutes   PT Time Calculation (min) 50 min    Activity Tolerance Patient tolerated treatment well;Patient limited by pain    Behavior During Therapy [facility] for tasks assessed/performed             Past Medical History:  Diagnosis Date   Asthma    COVID-19 virus infection 12/10/2019   Palpitations    Tinea pedis 01/27/2019   Toe injury, left, initial encounter 01/27/2019   UTI symptoms 04/01/2020    Past Surgical History:  Procedure Laterality Date   GALLBLADDER SURGERY     TUBAL LIGATION      There were no vitals filed for this visit.   Subjective Assessment - 03/14/21 0719     Subjective Patient reports her hip was in a lot of pain yesterday. She was at a cookout and couldn't find a comfortable position. She reports having increased pain about the posterolateral hip and used her TENS, tylenol, and ice last night. She reports subjective overall improvement of 20% reporting that the pain is not throbbing constantly, but continues to have increased pain with standing and walking. She has f/u with MD on Friday. She is still on light duty.    How long can you sit comfortably? 5-10 minutes    How long can you stand comfortably? 30 minutes    How long can you walk comfortably? maybe 5-10 minutes without having to stop    Currently in Pain? Yes    Pain  Score 5     Pain Location Hip    Pain Orientation Left;Lateral    Pain Descriptors / Indicators Aching    Pain Type Acute pain    Pain Onset 1 to 4 weeks ago    Pain Frequency Constant    Aggravating Factors  sitting, walking, standing    Pain Relieving Factors ice, elevation, laying on the Rt side, sitting on pillows    Effect of Pain on Daily Activities unable to complete full work duty                [facility] PT Assessment - 03/14/21 0001       Assessment   Medical Diagnosis S70.02XA (ICD-10-CM) - Contusion of left hip, initial encounter      Observation/Other Assessments   Focus on Therapeutic Outcomes (FOTO)  41% function      AROM   Left Hip Flexion 95      Strength   Left Hip Flexion 5/5    Left Hip Extension 4/5    Left Hip External Rotation 4+/5    Left Hip Internal Rotation 4+/5   mild pain lateral hip   Left Hip ABduction 4-/5   pain lateral hip   Left Hip ADduction 5/5    Left Knee Flexion  5/5   mild increased lateral hip pain   Left Knee Extension 5/5                           [facility] Adult PT Treatment/Exercise - 03/14/21 0001       Self-Care   Other Self-Care Comments  see patient education      Knee/Hip Exercises: Standing   Heel Raises 10 reps    Heel Raises Limitations x2    Knee Flexion 10 reps    Knee Flexion Limitations x2; Lt    Hip Abduction 10 reps    Abduction Limitations x2; Lt    Hip Extension 10 reps    Extension Limitations x2; Lt    Forward Step Up 10 reps    Forward Step Up Limitations x2; 2 inch step LLE leading    Functional Squat 5 reps    Functional Squat Limitations x3    Other Standing Knee Exercises standing march 2  x10    Other Standing Knee Exercises step taps 2  x10 8 inch step      Cryotherapy   Number Minutes Cryotherapy 10 Minutes    Cryotherapy Location Hip    Type of Cryotherapy Ice pack                    PT Education - 03/14/21 0750     Education Details education on overall  functional progress, FOTO score, updated HEP.    Person(s) Educated Patient    Methods Explanation;Demonstration;Verbal cues;Handout    Comprehension Verbalized understanding;Returned demonstration;Verbal cues required              PT Short Term Goals - 03/03/21 1052       PT SHORT TERM GOAL #1   Title Patient will be independent with initial HEP    Baseline pt demonstrates independence    Time 1    Period Weeks    Status Achieved    Target Date 03/03/21               PT Long Term Goals - 03/14/21 0725       PT LONG TERM GOAL #1   Title Patient will self-report tolerating at least 30 minutes of walking activity.    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    Baseline able to complete body weight squat without pain    Time 4    Period Weeks    Status Achieved  PT LONG TERM GOAL #5   Title Patient will score at least 66% function on FOTO to signify clinically meaningful improvement in functional abilities.    Baseline 41%    Time 4    Period Weeks    Status On-going                   Plan - 03/14/21 0733     Clinical Impression Statement Patient is making gradual functional progress since start of care demonstrating improvements in hip ROM and strength. She is  able to complete some work specific activity without reports of pain (squatting and step ups) today, though continues to report moderate pain levels about the left posterolateral hip with prolonged sitting, walking, and standing activity. She will benefit from continued PT 2x/week for 3 weeks to further progress her strength, address her ongoing hip pain, and safely progress functional/work specific activity in order to return to full work duty.    PT Frequency 2x / week    PT Duration 3 weeks    PT Treatment/Interventions ADLs/Self Care Home Management;Cryotherapy;Electrical Stimulation;Iontophoresis 87m/ml Dexamethasone;Moist Heat;Ultrasound;Stair training;Therapeutic activities;Therapeutic exercise;Balance training;Neuromuscular re-education;Gait training;Patient/family education;Manual techniques;Passive range of motion;Dry needling;Taping    PT Next Visit Plan progress standing strengthening and work specific activity as tolerated, consider ultrasound    PT Home Exercise Plan Access Code [email or web address]    Consulted and Agree with Plan of Care Patient             Patient will benefit from skilled therapeutic intervention in order to improve the following deficits and impairments:  Decreased range of motion, Difficulty walking, Decreased activity tolerance, Pain, Decreased strength  Visit Diagnosis: Pain in left hip  Muscle weakness (generalized)  Difficulty in walking, not elsewhere classified  Stiffness of left hip, not elsewhere classified     Problem List Patient Active Problem List   Diagnosis Date Noted   GAD (generalized anxiety disorder) 02/16/2021   Shortness of breath 03/29/2020   Stress and adjustment reaction 02/14/2020   Allergic rhinitis 06/04/2015   Obesity 01/02/2008   Asthma, mild persistent 10/18/2006   SGwendolyn Grant PT, DPT, ATC 03/14/21 9:07 AM   [facility] [address]Phone:  [phone]  Fax:  [phone] Name: Donna SCARFONEMRN: [id number] of Birth: 711-12-1975

## 2021-03-17 ENCOUNTER — Ambulatory Visit: Payer: PRIVATE HEALTH INSURANCE | Attending: Occupational Medicine | Admitting: Physical Therapy

## 2021-03-17 ENCOUNTER — Encounter: Payer: Self-pay | Admitting: Physical Therapy

## 2021-03-17 ENCOUNTER — Other Ambulatory Visit: Payer: Self-pay

## 2021-03-17 DIAGNOSIS — M25552 Pain in left hip: Secondary | ICD-10-CM | POA: Diagnosis not present

## 2021-03-17 DIAGNOSIS — R262 Difficulty in walking, not elsewhere classified: Secondary | ICD-10-CM | POA: Insufficient documentation

## 2021-03-17 DIAGNOSIS — M6281 Muscle weakness (generalized): Secondary | ICD-10-CM | POA: Insufficient documentation

## 2021-03-17 DIAGNOSIS — M25652 Stiffness of left hip, not elsewhere classified: Secondary | ICD-10-CM | POA: Insufficient documentation

## 2021-03-17 NOTE — Therapy (Signed)
[place], [place], [address] Phone: [phone]   Fax:  [phone]  Physical Therapy Treatment  Patient Details  Name: Donna Carlson MRN: [id number] Date of Birth: 11/24/1975 Referring Provider (PT): Lossie Faes   Encounter Date: 03/17/2021   PT End of Session - 03/17/21 0723     Visit Number 6    Number of Visits 11    Date for PT Re-Evaluation 04/09/21    Authorization Type Workers Comp    Authorization - Visit Number 6    Authorization - Number of Visits 6    PT Start Time 0715    PT Stop Time 0810    PT Time Calculation (min) 55 min             Past Medical History:  Diagnosis Date   Asthma    COVID-19 virus infection 12/10/2019   Palpitations    Tinea pedis 01/27/2019   Toe injury, left, initial encounter 01/27/2019   UTI symptoms 04/01/2020    Past Surgical History:  Procedure Laterality Date   GALLBLADDER SURGERY     TUBAL LIGATION      There were no vitals filed for this visit.   Subjective Assessment - 03/17/21 0718     Subjective Pt reports no pain today. She reports she is taking it easy this week and is off of work all week for her birthday.    Currently in Pain? No/denies                               [facility] Adult PT Treatment/Exercise - 03/17/21 0001       Knee/Hip Exercises: Stretches   Active Hamstring Stretch Limitations supine with strap 60 seconds    ITB Stretch Limitations supine with strap 10 sec x 3    Other Knee/Hip Stretches SKTC 2 x 30 sec each, figure 4 push and pull, LTR with push/pull      Knee/Hip Exercises: Aerobic   Nustep Level 4 UE/LE x 5 minutes   reduced to level 1 due to discomfort in left hip     Knee/Hip Exercises: Standing   Heel Raises 10 reps    Knee Flexion 10 reps    Knee Flexion Limitations x2; Lt    Hip Abduction 10 reps    Abduction Limitations x2; Lt    Hip Extension 10 reps    Extension Limitations x2;  Lt    Forward Step Up 10 reps    Forward Step Up Limitations x2; 4 inch step LLE leading    Functional Squat 10 reps;2 sets    Other Standing Knee Exercises standing march 2  x10      Knee/Hip Exercises: Supine   Bridges Limitations with initial PPT, half ROM tolerated without increased pain    Other Supine Knee/Hip Exercises pelvic tilit  5 sec x 10      Cryotherapy   Number Minutes Cryotherapy 10 Minutes    Cryotherapy Location Hip    Type of Cryotherapy Ice pack      Manual Therapy   Soft tissue mobilization Soft Foam roller to lateral hip and thigh                      PT Short Term Goals - 03/03/21 1052       PT SHORT TERM GOAL #1   Title Patient will be independent with initial  HEP    Baseline pt demonstrates independence    Time 1    Period Weeks    Status Achieved    Target Date 03/03/21               PT Long Term Goals - 03/14/21 0725       PT LONG TERM GOAL #1   Title Patient will self-report tolerating at least 30 minutes of walking activity.    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    Baseline able to complete body weight squat without pain    Time 4    Period Weeks    Status Achieved      PT LONG TERM GOAL #5   Title Patient will score at least  66% function on FOTO to signify clinically meaningful improvement in functional abilities.    Baseline 41%    Time 4    Period Weeks    Status On-going                   Plan - 03/17/21 5621     Clinical Impression Statement Pt reports no pain since last visit that she attributes to being off work and taking it easy. Able to complete closed chain strengthening including 4 inch step ups with min discomfort. Used Foam roller to decrease muscle tautness in lateral hip and thigh. She reported feeling good at end of session.    PT Next Visit Plan progress standing strengthening and work specific activity as tolerated, consider ultrasound    PT Home Exercise Plan Access Code [email or web address]             Patient will benefit from skilled therapeutic intervention in order to improve the following deficits and impairments:  Decreased range of motion, Difficulty walking, Decreased activity tolerance, Pain, Decreased strength  Visit Diagnosis: Pain in left hip  Muscle weakness (generalized)  Difficulty in walking, not elsewhere classified  Stiffness of left hip, not elsewhere classified     Problem List Patient Active Problem List   Diagnosis Date Noted   GAD (generalized anxiety disorder) 02/16/2021   Shortness of breath 03/29/2020   Stress and adjustment reaction 02/14/2020   Allergic rhinitis 06/04/2015   Obesity 01/02/2008   Asthma, mild persistent 10/18/2006    Dorene Ar, PTA 03/17/2021, 8:32 AM  [facility] [address] [place], [place], [address] Phone: [phone]   Fax:  [phone]  Name: Donna Carlson MRN: [id number] Date of Birth: Jun 04, 1976

## 2021-03-23 ENCOUNTER — Ambulatory Visit: Payer: PRIVATE HEALTH INSURANCE | Attending: Occupational Medicine

## 2021-03-23 ENCOUNTER — Other Ambulatory Visit: Payer: Self-pay

## 2021-03-23 DIAGNOSIS — M25552 Pain in left hip: Secondary | ICD-10-CM | POA: Insufficient documentation

## 2021-03-23 DIAGNOSIS — M6281 Muscle weakness (generalized): Secondary | ICD-10-CM | POA: Insufficient documentation

## 2021-03-23 DIAGNOSIS — R262 Difficulty in walking, not elsewhere classified: Secondary | ICD-10-CM | POA: Diagnosis not present

## 2021-03-23 DIAGNOSIS — M25652 Stiffness of left hip, not elsewhere classified: Secondary | ICD-10-CM | POA: Diagnosis not present

## 2021-03-23 NOTE — Therapy (Signed)
[place], [place], [address] Phone: [phone]   Fax:  [phone]  Physical Therapy Treatment  Patient Details  Name: Donna Carlson MRN: [id number] Date of Birth: Feb 16, 1976 Referring Provider (PT): Lossie Faes   Encounter Date: 03/23/2021   PT End of Session - 03/23/21 1533     Visit Number 7    Number of Visits 11    Date for PT Re-Evaluation 04/09/21    Authorization Type Workers Comp    Authorization Time Period 8/1-8/12    Authorization - Visit Number 1    Authorization - Number of Visits 2    PT Start Time 2800    PT Stop Time 1623   ice at end of session   PT Time Calculation (min) 50 min    Activity Tolerance Patient tolerated treatment well    Behavior During Therapy [facility] [address] for tasks assessed/performed             Past Medical History:  Diagnosis Date   Asthma    COVID-19 virus infection 12/10/2019   Palpitations    Tinea pedis 01/27/2019   Toe injury, left, initial encounter 01/27/2019   UTI symptoms 04/01/2020    Past Surgical History:  Procedure Laterality Date   GALLBLADDER SURGERY     TUBAL LIGATION      There were no vitals filed for this visit.   Subjective Assessment - 03/23/21 1536     Subjective Patient reports she is tired and has a headache. She reports the weekend of the 24-25th she had excruciating pain at a cookout she reports another episode of excruciating pain this past Wednesday when she was sitting on the couch. She is being referred to ortho due to ongoing pain.    Currently in Pain? Yes    Pain Score 6     Pain Location Hip    Pain Orientation Left;Lateral    Pain Descriptors / Indicators Aching;Dull    Pain Type Acute pain    Pain Onset More than a month ago    Pain Frequency Intermittent    Aggravating Factors  walking, sitting    Pain Relieving Factors elevation    Effect of Pain on Daily Activities unable to complete full work duty                                [facility] Adult PT Treatment/Exercise - 03/23/21 0001       Knee/Hip Exercises: Standing   Functional Squat 10 reps;2 sets    Other Standing Knee Exercises 3 way hip 1  x10 bilateral    Other Standing Knee Exercises reverse lunge 1 x 10 bilaterally partial range single UE support; lateral band walks green band at shin 4 x 10 ft      Knee/Hip Exercises: Supine   Straight Leg Raises 10 reps    Straight Leg Raises Limitations x2; LLE      Knee/Hip Exercises: Sidelying   Clams 2 x 10; LLE      Knee/Hip Exercises: Prone   Other Prone Exercises quadruped leg extension 2 x 10 LLE      Cryotherapy   Number Minutes Cryotherapy 10 Minutes    Cryotherapy Location Hip    Type of Cryotherapy Ice pack      Manual Therapy   Joint Mobilization inferior and posterior hip mobilization grade II-III; LAD x 1 min Lt  Soft tissue mobilization STM to posterior gluteal musculature                      PT Short Term Goals - 03/03/21 1052       PT SHORT TERM GOAL #1   Title Patient will be independent with initial HEP    Baseline pt demonstrates independence    Time 1    Period Weeks    Status Achieved    Target Date 03/03/21               PT Long Term Goals - 03/14/21 0725       PT LONG TERM GOAL #1   Title Patient will self-report tolerating at least 30 minutes of walking activity.    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    Baseline able to complete body weight squat without pain    Time 4    Period Weeks    Status Achieved      PT LONG TERM GOAL #5   Title Patient will score at least 66% function on FOTO to signify clinically meaningful improvement in functional abilities.    Baseline 41%    Time 4    Period Weeks    Status On-going                   Plan - 03/23/21 1549     Clinical Impression Statement Patient arrives with moderate pain levels about the lateral left hip. Continued with OKC strengthening of the left hip with patient reporting muscle tightness, though no increase in pain. She has difficulty maintaining neutral trunk/pelvic alignment during SL activity on the LLE. Able to progress CKC strengthening with patient reporting difficulty with these exercises, though no increase in pain. She reported that her hip felt "worked" at the end of the session, though no increased pain.    PT Next Visit Plan progress standing strengthening and work specific activity as tolerated,    PT Home Exercise Plan Access Code [email or web address]    Consulted and Agree with Plan of Care Patient             Patient will benefit from skilled therapeutic intervention in order to improve the following deficits and impairments:  Decreased range of motion, Difficulty walking, Decreased activity tolerance, Pain, Decreased strength  Visit Diagnosis: Pain in left hip  Muscle weakness (generalized)  Difficulty in walking, not elsewhere classified  Stiffness of left hip, not elsewhere classified     Problem  List Patient Active Problem List   Diagnosis Date Noted   GAD (generalized anxiety disorder) 02/16/2021   Shortness of breath 03/29/2020   Stress and adjustment reaction 02/14/2020   Allergic rhinitis 06/04/2015   Obesity  01/02/2008   Asthma, mild persistent 10/18/2006   Gwendolyn Grant, PT, DPT, ATC 03/23/21 4:15 PM  [facility] [address] [place], [place], [address] Phone: [phone]   Fax:  [phone]  Name: Donna Carlson MRN: [id number] Date of Birth: [birth year]

## 2021-03-30 ENCOUNTER — Ambulatory Visit: Payer: PRIVATE HEALTH INSURANCE

## 2021-03-31 ENCOUNTER — Other Ambulatory Visit: Payer: Self-pay

## 2021-03-31 ENCOUNTER — Ambulatory Visit: Payer: PRIVATE HEALTH INSURANCE

## 2021-03-31 DIAGNOSIS — R262 Difficulty in walking, not elsewhere classified: Secondary | ICD-10-CM | POA: Diagnosis not present

## 2021-03-31 DIAGNOSIS — M25652 Stiffness of left hip, not elsewhere classified: Secondary | ICD-10-CM | POA: Diagnosis not present

## 2021-03-31 DIAGNOSIS — M25552 Pain in left hip: Secondary | ICD-10-CM

## 2021-03-31 DIAGNOSIS — M6281 Muscle weakness (generalized): Secondary | ICD-10-CM

## 2021-03-31 NOTE — Therapy (Signed)
[place], [place], [address] Phone: [phone]   Fax:  [phone]  Physical Therapy Treatment/Re-evaluation  Patient Details  Name: Donna Carlson MRN: [id number] Date of Birth: 25-Sep-1975 Referring Provider (PT): Lossie Faes   Encounter Date: 03/31/2021   PT End of Session - 03/31/21 0931     Visit Number 8    Number of Visits 14    Date for PT Re-Evaluation 04/23/21    Authorization Type Workers Comp    Authorization Time Period 8/1-8/12    Authorization - Visit Number 2    Authorization - Number of Visits 2    PT Start Time 0931    PT Stop Time 1025   ice at end of session   PT Time Calculation (min) 54 min    Activity Tolerance Patient tolerated treatment well    Behavior During Therapy [facility] for tasks assessed/performed             Past Medical History:  Diagnosis Date   Asthma    COVID-19 virus infection 12/10/2019   Palpitations    Tinea pedis 01/27/2019   Toe injury, left, initial encounter 01/27/2019   UTI symptoms 04/01/2020    Past Surgical History:  Procedure Laterality Date   GALLBLADDER SURGERY     TUBAL LIGATION      There were no vitals filed for this visit.   Subjective Assessment - 03/31/21 0932     Subjective Patient reports she has been feeling ok. She is still having some tightness in the posterolateral hip. At home she is just taking it easy because she is afraid of causing more pain. She reports the pain is not constant anymore, but is worsened with walking and stress. She has an appt with ortho on Tuesday. She reports subjective overall improvement of 70% as she still has difficulty with walking long distances and repetitive transfers at work and is unsure if she would be able to wear lead at work.    How long can you sit comfortably? 30 minutes    How long can you stand comfortably? 15-20 minutes    How long can you walk comfortably? about 15-20 minutes     Currently in Pain? Yes    Pain Score 2     Pain Location Hip    Pain Orientation Left;Lateral    Pain Descriptors / Indicators --   pinching   Pain Type --   subacute   Pain Onset More than a month ago    Pain Frequency Intermittent    Aggravating Factors  walking, transfers    Pain Relieving Factors sitting, laying, stretching    Effect of Pain on Daily Activities unable to complete full work duty                [facility] PT Assessment - 03/31/21 0001       Observation/Other Assessments   Focus on Therapeutic Outcomes (FOTO)  43% function      Strength   Left Hip Flexion 5/5    Left Hip Extension 4+/5    Left Hip External Rotation 5/5    Left Hip Internal Rotation 5/5    Left Hip ABduction 4+/5    Left Hip ADduction 5/5    Left Knee Flexion 5/5    Left Knee Extension 5/5                           [facility]  Adult PT Treatment/Exercise - 03/31/21 0001       Self-Care   Other Self-Care Comments  see patient education      Knee/Hip Exercises: Public affairs consultant 60 seconds    Quad Stretch Limitations LLE    ITB Stretch Limitations supine with strap 60 sec    Piriformis Stretch 60 seconds    Piriformis Stretch Limitations LLE    Other Knee/Hip Stretches SKTC 60 sec LLE      Knee/Hip Exercises: Aerobic   Tread Mill 5 minutes level 1.4      Knee/Hip Exercises: Machines for Strengthening   Hip Cybex 2 x10 flexion at 25 lbs      Knee/Hip Exercises: Standing   Forward Step Up 10 reps    Forward Step Up Limitations LLE leading 6 inch step and 8 inch step; hurdle step up with RLE leading on 8 inch    Other Standing Knee Exercises sumo squat with KB 2 x 10 5#      Cryotherapy   Number Minutes Cryotherapy 10 Minutes    Cryotherapy Location Hip    Type of Cryotherapy Ice pack                      PT Short Term Goals - 03/03/21 1052       PT SHORT TERM GOAL #1   Title Patient will be independent with initial HEP    Baseline pt  demonstrates independence    Time 1    Period Weeks    Status Achieved    Target Date 03/03/21               PT Long Term Goals - 03/31/21 0938       PT LONG TERM GOAL #1   Title Patient will self-report tolerating at least 30 minutes of walking activity.    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    Baseline able to complete body weight squat without pain    Time 4    Period Weeks    Status Achieved      PT LONG TERM GOAL #5   Title Patient will score at least 66% function on FOTO to signify clinically meaningful improvement in functional abilities.    Baseline 43%    Time 4    Period Weeks    Status On-going                   Plan - 03/31/21 0939     Clinical Impression Statement Patient is making gradual functional  progress following Lt hip injury at work on 02/11/21. She has met 3/5 long term functional goals. Her current limitations are prolonged walking/standing activity. She has significant improvements in hip strength compared to initial evaluation and has been tolerating progression into functional/work specific activities well. She will benefit from continuing with PT 1-2 week for 3 weeks to further progress her hip strength and safely progress standing/functional activity in order to return to full work  duty.    PT Frequency --   1-2/week   PT Duration 3 weeks    PT Treatment/Interventions ADLs/Self Care Home Management;Cryotherapy;Electrical Stimulation;Iontophoresis 80m/ml Dexamethasone;Moist Heat;Ultrasound;Stair training;Therapeutic activities;Therapeutic exercise;Balance training;Neuromuscular re-education;Gait training;Patient/family education;Manual techniques;Passive range of motion;Dry needling;Taping    PT Next Visit Plan progress standing strengthening and work specific activity as tolerated, KB walks    PT Home Exercise Plan Access Code [email or web address]    Consulted and Agree with Plan of Care Patient             Patient will benefit from skilled therapeutic intervention in order to improve the following deficits and impairments:  Decreased range of motion, Difficulty walking, Decreased activity tolerance, Pain, Decreased strength  Visit Diagnosis: Pain in left hip  Muscle weakness (generalized)  Difficulty in walking, not elsewhere classified  Stiffness of left hip, not elsewhere classified     Problem List Patient Active Problem List   Diagnosis Date Noted   GAD (generalized anxiety disorder) 02/16/2021   Shortness of breath 03/29/2020   Stress and adjustment reaction 02/14/2020   Allergic rhinitis 06/04/2015   Obesity 01/02/2008   Asthma, mild persistent 10/18/2006   SGwendolyn Grant PT, DPT, ATC 03/31/21 11:51 AM   [facility] [address]Phone: [phone]  Fax:  [phone] Name: Donna HAKESMRN: [id number] of Birth: [birth year]

## 2021-04-12 ENCOUNTER — Other Ambulatory Visit: Payer: Self-pay

## 2021-04-12 ENCOUNTER — Ambulatory Visit: Payer: PRIVATE HEALTH INSURANCE

## 2021-04-12 DIAGNOSIS — M25652 Stiffness of left hip, not elsewhere classified: Secondary | ICD-10-CM

## 2021-04-12 DIAGNOSIS — R262 Difficulty in walking, not elsewhere classified: Secondary | ICD-10-CM

## 2021-04-12 DIAGNOSIS — M6281 Muscle weakness (generalized): Secondary | ICD-10-CM | POA: Diagnosis not present

## 2021-04-12 DIAGNOSIS — M25552 Pain in left hip: Secondary | ICD-10-CM | POA: Diagnosis not present

## 2021-04-12 NOTE — Therapy (Signed)
[place], [place], [address] Phone: [phone]   Fax:  [phone]  Physical Therapy Treatment  Patient Details  Name: Donna Carlson MRN: [id number] Date of Birth: 1976-01-10 Referring Provider (PT): Lossie Faes   Encounter Date: 04/12/2021   PT End of Session - 04/12/21 1746     Visit Number 9    Number of Visits 14    Date for PT Re-Evaluation 04/23/21    Authorization Type Workers Comp    Authorization Time Period 6 visits approved until 9/12    Authorization - Visit Number 1    Authorization - Number of Visits 6    PT Start Time [id number]    PT Stop Time 1829    PT Time Calculation (min) 42 min    Activity Tolerance Patient tolerated treatment well    Behavior During Therapy [facility] for tasks assessed/performed             Past Medical History:  Diagnosis Date   Asthma    COVID-19 virus infection 12/10/2019   Palpitations    Tinea pedis 01/27/2019   Toe injury, left, initial encounter 01/27/2019   UTI symptoms 04/01/2020    Past Surgical History:  Procedure Laterality Date   GALLBLADDER SURGERY     TUBAL LIGATION      There were no vitals filed for this visit.   Subjective Assessment - 04/12/21 1747     Subjective She saw ortho and had an injection that she thinks caused more pain, but then had a massge the following day after the injection which helped with her pain. She reports her pain is continuing to improve and she has been able to walk for about 15-20 minutes before experiencing pain or stiffness. Ortho f/u is on 05/02/21    How long can you sit comfortably? 30 minutes    How long can you stand comfortably? 15-20 minutes    How long can you walk comfortably? about 15-20 minutes    Currently in Pain? No/denies                               [facility] Adult PT Treatment/Exercise - 04/12/21 0001       Self-Care   Other Self-Care Comments  see patient education       Knee/Hip Exercises: Public affairs consultant 60 seconds    Quad Stretch Limitations LLE    ITB Stretch 30 seconds    ITB Stretch Limitations left x2    Piriformis Stretch 60 seconds    Piriformis Stretch Limitations LLE    Other Knee/Hip Stretches SKTC 60 sec LLE    Other Knee/Hip Stretches walking hip opener 2 x 15 ft      Knee/Hip Exercises: Aerobic   Tread Mill 5 minutes level 1.9      Knee/Hip Exercises: Standing   Forward Step Up 10 reps    Forward Step Up Limitations to hurdle stance; 8 inch step    Other Standing Knee Exercises standing 3 way hip on airex 1 x 10 each    Other Standing Knee Exercises KB swing 2 x 10; 5# KB; monster walks green band at shin 2 sets d/b x 15 ft      Knee/Hip Exercises: Supine   Bridges 10 reps    Bridges Limitations x2 on stbaility ball      Knee/Hip Exercises: Prone   Other Prone Exercises  quadruped fire hydrant 2 x 10 bilateral                    PT Education - 04/12/21 1829     Education Details Updated HEP.    Person(s) Educated Patient    Methods Explanation;Demonstration;Verbal cues;Handout    Comprehension Verbalized understanding;Returned demonstration;Verbal cues required              PT Short Term Goals - 03/03/21 1052       PT SHORT TERM GOAL #1   Title Patient will be independent with initial HEP    Baseline pt demonstrates independence    Time 1    Period Weeks    Status Achieved    Target Date 03/03/21               PT Long Term Goals - 03/31/21 0938       PT LONG TERM GOAL #1   Title Patient will self-report tolerating at least 30 minutes of walking activity.    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    Baseline able to complete body weight squat without pain    Time 4    Period Weeks    Status Achieved      PT LONG TERM GOAL #5   Title Patient will score at least 66% function on FOTO to signify clinically meaningful improvement in functional abilities.    Baseline 43%    Time 4    Period Weeks    Status On-going                   Plan - 04/12/21 1752     Clinical Impression Statement Patient tolerated session well today with reports of hip tightness, though no pain. Able to progress CKC strengthening with patient demonstrating good form with squatting activity. She has more difficulty maintaining balance during SL activity on the LLE. Able to progress quadruped hip strengthening with patient demonstrating good lumbopelvic stability, though quickly fatigues in this position.    PT Frequency --   1-2/week   PT Duration 3 weeks    PT Treatment/Interventions ADLs/Self Care Home Management;Cryotherapy;Electrical Stimulation;Iontophoresis '4mg'$ /ml Dexamethasone;Moist Heat;Ultrasound;Stair training;Therapeutic activities;Therapeutic exercise;Balance training;Neuromuscular re-education;Gait training;Patient/family education;Manual techniques;Passive range of motion;Dry needling;Taping    PT Next Visit Plan FOTOprogress standing strengthening and work specific activity as tolerated, KB walks    PT Home Exercise Plan Access Code [email or web address]    Consulted and Agree with Plan of Care Patient             Patient will  benefit from skilled therapeutic intervention in order to improve the following deficits and  impairments:  Decreased range of motion, Difficulty walking, Decreased activity tolerance, Pain, Decreased strength  Visit Diagnosis: Pain in left hip  Muscle weakness (generalized)  Difficulty in walking, not elsewhere classified  Stiffness of left hip, not elsewhere classified     Problem List Patient Active Problem List   Diagnosis Date Noted   GAD (generalized anxiety disorder) 02/16/2021   Shortness of breath 03/29/2020   Stress and adjustment reaction 02/14/2020   Allergic rhinitis 06/04/2015   Obesity 01/02/2008   Asthma, mild persistent 10/18/2006   Gwendolyn Grant, PT, DPT, ATC 04/12/21 6:36 PM   [facility] [address] [place], [place], [address] Phone: [phone]   Fax:  [phone]  Name: Donna Carlson MRN: [id number] Date of Birth: 1976/01/13

## 2021-04-14 ENCOUNTER — Ambulatory Visit: Payer: PRIVATE HEALTH INSURANCE

## 2021-04-15 ENCOUNTER — Encounter: Payer: Self-pay | Admitting: Physical Therapy

## 2021-04-15 ENCOUNTER — Other Ambulatory Visit: Payer: Self-pay

## 2021-04-15 ENCOUNTER — Ambulatory Visit: Payer: PRIVATE HEALTH INSURANCE | Admitting: Physical Therapy

## 2021-04-15 DIAGNOSIS — R262 Difficulty in walking, not elsewhere classified: Secondary | ICD-10-CM

## 2021-04-15 DIAGNOSIS — M6281 Muscle weakness (generalized): Secondary | ICD-10-CM | POA: Diagnosis not present

## 2021-04-15 DIAGNOSIS — M25652 Stiffness of left hip, not elsewhere classified: Secondary | ICD-10-CM

## 2021-04-15 DIAGNOSIS — M25552 Pain in left hip: Secondary | ICD-10-CM | POA: Diagnosis not present

## 2021-04-15 NOTE — Therapy (Signed)
[place], [place], [address] Phone: [phone]   Fax:  [phone]  Physical Therapy Treatment  Patient Details  Name: Donna Carlson MRN: [id number] Date of Birth: 09-13-1975 Referring Provider (PT): Lossie Faes   Encounter Date: 04/15/2021   PT End of Session - 04/15/21 0808     Visit Number 10    Number of Visits 14    Date for PT Re-Evaluation 04/23/21    Authorization Type Workers Comp    Authorization Time Period 6 visits approved until 9/12    Authorization - Visit Number 2    Authorization - Number of Visits 6    PT Start Time 0802    PT Stop Time 0845    PT Time Calculation (min) 43 min    Activity Tolerance Patient tolerated treatment well;No increased pain    Behavior During Therapy [facility] for tasks assessed/performed             Past Medical History:  Diagnosis Date   Asthma    COVID-19 virus infection 12/10/2019   Palpitations    Tinea pedis 01/27/2019   Toe injury, left, initial encounter 01/27/2019   UTI symptoms 04/01/2020    Past Surgical History:  Procedure Laterality Date   GALLBLADDER SURGERY     TUBAL LIGATION      There were no vitals filed for this visit.   Subjective Assessment - 04/15/21 0805     Subjective No pain today.    Currently in Pain? No/denies                [facility] PT Assessment - 04/15/21 0001       Observation/Other Assessments   Focus on Therapeutic Outcomes (FOTO)  59% function                           [facility] Adult PT Treatment/Exercise - 04/15/21 0001       Knee/Hip Exercises: Stretches   Sports administrator 60 seconds    Quad Stretch Limitations bilat    ITB Stretch 30 seconds    ITB Stretch Limitations bilat    Piriformis Stretch 60 seconds    Piriformis Stretch Limitations bilat, also qped    Other Knee/Hip Stretches SKTC 60 sec bilat      Knee/Hip Exercises: Standing   Forward Step Up 15 reps    Forward Step  Up Limitations with opp knee drive , no UE    Functional Squat Limitations side stepping squats with green band 25 feet x 4    Other Standing Knee Exercises standing 3 way hip on foam pad 1 x 10 each   able to complete without UE   Other Standing Knee Exercises KB swing 5# x 10, 10# x 10, Single leg squat to touch top of KB,   10 reps x 2 sets bilateral no wright      Knee/Hip Exercises: Prone   Other Prone Exercises quadruped fire hydrant 2 x 10 bilateral                      PT Short Term Goals - 03/03/21 1052       PT SHORT TERM GOAL #1   Title Patient will be independent with initial HEP    Baseline pt demonstrates independence    Time 1    Period Weeks    Status Achieved    Target Date 03/03/21  PT Long Term Goals - 04/15/21 0810       PT LONG TERM GOAL #1   Title Patient will self-report tolerating at least 30 minutes of walking activity.    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    Time 4    Period Weeks    Status Achieved      PT LONG TERM GOAL #4   Title Patient will squat with proper form without reports of hip pain to improve ability to assist in patient transfers.    Baseline able to complete body weight squat without pain    Time 4    Period Weeks    Status Achieved      PT LONG TERM GOAL #5   Title Patient will score at least 66% function on FOTO to signify clinically meaningful improvement in functional abilities.    Baseline 59% 04/15/21    Time 4    Period Weeks    Status  On-going                   Plan - 04/15/21 5465     Clinical Impression Statement Pt arrives without pain. She reports walking at track is still limited to 20 minutes before increased pain and stiffness occurs. Her FOTO score indicates improvement in household chore completion and walking tolerance however has not yet met predicted goal. Continued with functional strength and closed chain emphasis. Able to progress to single leg squat weight and no increased pain.    PT Treatment/Interventions ADLs/Self Care Home Management;Cryotherapy;Electrical Stimulation;Iontophoresis 45m/ml Dexamethasone;Moist Heat;Ultrasound;Stair training;Therapeutic activities;Therapeutic exercise;Balance training;Neuromuscular re-education;Gait training;Patient/family education;Manual techniques;Passive range of motion;Dry needling;Taping    PT Next Visit Plan progress standing strengthening and work specific activity as tolerated, KB walks    PT Home Exercise Plan Access Code [email or web address]             Patient will benefit from skilled therapeutic intervention in order to improve the following deficits and impairments:  Decreased range of motion, Difficulty walking, Decreased activity tolerance, Pain, Decreased strength  Visit Diagnosis: Pain in left hip  Muscle weakness (generalized)  Difficulty in walking, not elsewhere classified  Stiffness of left hip, not elsewhere classified     Problem List Patient Active Problem List   Diagnosis Date Noted   GAD (generalized anxiety disorder) 02/16/2021   Shortness of breath 03/29/2020   Stress and adjustment reaction 02/14/2020   Allergic rhinitis 06/04/2015   Obesity 01/02/2008   Asthma, mild persistent 10/18/2006    DDorene Ar PTA 04/15/2021, 8:41 AM  [facility] [address]Phone: [phone]  Fax:  [phone] Name: Donna SHADLEMRN:  [id number] of Birth: [birth year]

## 2021-04-19 ENCOUNTER — Ambulatory Visit: Payer: PRIVATE HEALTH INSURANCE

## 2021-04-19 ENCOUNTER — Other Ambulatory Visit: Payer: Self-pay

## 2021-04-19 DIAGNOSIS — M25552 Pain in left hip: Secondary | ICD-10-CM | POA: Diagnosis not present

## 2021-04-19 DIAGNOSIS — M25652 Stiffness of left hip, not elsewhere classified: Secondary | ICD-10-CM | POA: Diagnosis not present

## 2021-04-19 DIAGNOSIS — R262 Difficulty in walking, not elsewhere classified: Secondary | ICD-10-CM

## 2021-04-19 DIAGNOSIS — M6281 Muscle weakness (generalized): Secondary | ICD-10-CM

## 2021-04-19 NOTE — Therapy (Signed)
[place], [place], [address] Phone: [phone]   Fax:  [phone]  Physical Therapy Treatment  Patient Details  Name: Donna Carlson MRN: [id number] Date of Birth: October 23, 1975 Referring Provider (PT): Lossie Faes   Encounter Date: 04/19/2021   PT End of Session - 04/19/21 1745     Visit Number 11    Number of Visits 14    Date for PT Re-Evaluation 04/23/21    Authorization Type Workers Comp    Authorization Time Period 6 visits approved until 9/12    Authorization - Visit Number 3    Authorization - Number of Visits 6    PT Start Time [id number]    PT Stop Time [id number]   heat at end of session   PT Time Calculation (min) 52 min    Activity Tolerance Patient tolerated treatment well    Behavior During Therapy [facility] for tasks assessed/performed             Past Medical History:  Diagnosis Date   Asthma    COVID-19 virus infection 12/10/2019   Palpitations    Tinea pedis 01/27/2019   Toe injury, left, initial encounter 01/27/2019   UTI symptoms 04/01/2020    Past Surgical History:  Procedure Laterality Date   GALLBLADDER SURGERY     TUBAL LIGATION      There were no vitals filed for this visit.   Subjective Assessment - 04/19/21 1746     Subjective Patient reports she is feeling good today without any pain currently. Had some pain yesterday when sitting on the couch that was relieved with stretching and ibuprofen. She has been able to walk for 20-25 minutes.    Currently in Pain? No/denies                               [facility] Adult PT Treatment/Exercise - 04/19/21 0001       Knee/Hip Exercises: Stretches   Quad Stretch 60 seconds    Quad Stretch Limitations bilateral    ITB Stretch 30 seconds    ITB Stretch Limitations x2; left    Piriformis Stretch 60 seconds    Piriformis Stretch Limitations bilateral    Other Knee/Hip Stretches SKTC 60 sec bilat      Knee/Hip  Exercises: Aerobic   Elliptical 5 minutes level 6    Tread Mill 5 minutes level 2.3      Knee/Hip Exercises: Standing   Other Standing Knee Exercises TRX squat jump 2 x 10      Knee/Hip Exercises: Supine   Single Leg Bridge 10 reps   x2 bilateral     Modalities   Modalities Moist Heat      Moist Heat Therapy   Number Minutes Moist Heat 10 Minutes    Moist Heat Location Hip                      PT Short Term Goals - 03/03/21 1052       PT SHORT TERM GOAL #1   Title Patient will be independent with initial HEP    Baseline pt demonstrates independence    Time 1    Period Weeks    Status Achieved    Target Date 03/03/21               PT Long Term Goals - 04/15/21 [id number]  PT LONG TERM GOAL #1   Title Patient will self-report tolerating at least 30 minutes of walking activity.    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    Time 4    Period Weeks    Status Achieved      PT LONG TERM GOAL #4   Title Patient will squat with proper form without reports of hip pain to improve ability to assist in patient transfers.    Baseline able to complete body weight squat without pain    Time 4    Period Weeks    Status Achieved      PT LONG TERM GOAL #5   Title Patient will score at least 66% function on FOTO to signify clinically meaningful improvement in functional abilities.    Baseline 59% 04/15/21    Time 4    Period Weeks    Status On-going                   Plan - 04/19/21 1752     Clinical  Impression Statement Able to increase speed on treadmill without reports of pain. Continued with progression of hip strengthening with patient reporting a "ringing" sensation in the Lt posterior hip during first set of SL bridge on the RLE that was relieved with gluteal stretching. Able to initiate jumping activity with patient initially demonstrating stiff landing, though with continued practice able to complete soft landing with good form. Introduced ellipitical with patient quickly fatiguing, though no complaints of pain.    PT Treatment/Interventions ADLs/Self Care Home Management;Cryotherapy;Electrical Stimulation;Iontophoresis '4mg'$ /ml Dexamethasone;Moist Heat;Ultrasound;Stair training;Therapeutic activities;Therapeutic exercise;Balance training;Neuromuscular re-education;Gait training;Patient/family education;Manual techniques;Passive range of motion;Dry needling;Taping    PT Next Visit Plan progress standing strengthening and work specific activity as tolerated, KB walks    PT Home Exercise Plan Access Code [email or web address]             Patient will benefit from skilled therapeutic intervention in order to improve the following deficits and impairments:  Decreased range of motion, Difficulty walking, Decreased activity tolerance, Pain, Decreased strength  Visit Diagnosis: Pain in left hip  Muscle weakness (generalized)  Difficulty in walking, not elsewhere classified  Stiffness of left hip, not elsewhere classified     Problem List Patient Active Problem List   Diagnosis Date Noted   GAD (generalized anxiety disorder) 02/16/2021   Shortness of breath 03/29/2020   Stress and adjustment reaction 02/14/2020   Allergic rhinitis 06/04/2015   Obesity 01/02/2008   Asthma, mild persistent 10/18/2006   Gwendolyn Grant, PT, DPT, ATC 04/19/21 6:29 PM   [place] [address] [place], [place], [address] Phone: [phone]   Fax:   [phone]  Name: Donna Carlson MRN: [id number] Date of Birth: 1976-04-22

## 2021-04-21 ENCOUNTER — Other Ambulatory Visit: Payer: Self-pay

## 2021-04-21 ENCOUNTER — Ambulatory Visit: Payer: 59 | Attending: Specialist

## 2021-04-21 DIAGNOSIS — R262 Difficulty in walking, not elsewhere classified: Secondary | ICD-10-CM | POA: Diagnosis not present

## 2021-04-21 DIAGNOSIS — M25652 Stiffness of left hip, not elsewhere classified: Secondary | ICD-10-CM | POA: Insufficient documentation

## 2021-04-21 DIAGNOSIS — M25552 Pain in left hip: Secondary | ICD-10-CM | POA: Insufficient documentation

## 2021-04-21 DIAGNOSIS — M6281 Muscle weakness (generalized): Secondary | ICD-10-CM | POA: Diagnosis not present

## 2021-04-21 NOTE — Therapy (Signed)
[place], [place], [address] Phone: [phone]   Fax:  [phone]  Physical Therapy Treatment  Patient Details  Name: Donna Carlson MRN: [id number] Date of Birth: 03/10/1976 Referring Provider (PT): Lossie Faes   Encounter Date: 04/21/2021   PT End of Session - 04/21/21 1451     Visit Number 12    Number of Visits 14    Date for PT Re-Evaluation 04/23/21    Authorization Type Workers Comp    Authorization Time Period 6 visits approved until 9/12    Authorization - Visit Number 4    Authorization - Number of Visits 6    PT Start Time 1451   patient late   PT Stop Time 1530    PT Time Calculation (min) 39 min    Activity Tolerance Patient tolerated treatment well    Behavior During Therapy [facility] for tasks assessed/performed             Past Medical History:  Diagnosis Date   Asthma    COVID-19 virus infection 12/10/2019   Palpitations    Tinea pedis 01/27/2019   Toe injury, left, initial encounter 01/27/2019   UTI symptoms 04/01/2020    Past Surgical History:  Procedure Laterality Date   GALLBLADDER SURGERY     TUBAL LIGATION      There were no vitals filed for this visit.   Subjective Assessment - 04/21/21 1452     Subjective Patient reports she is tired from 14 hour shift yesterday, though no hip pain. Her hip didn't bother her at work yesterday, but she didn't trial walking to [employer], which has been an aggravating factor for her.    Currently in Pain? No/denies                               [facility] Adult PT Treatment/Exercise - 04/21/21 0001       Knee/Hip Exercises: Stretches   Sports administrator 60 seconds    Quad Stretch Limitations bilateral    ITB Stretch 30 seconds    ITB Stretch Limitations x2; left    Other Knee/Hip Stretches SKTC 60 sec bilat      Knee/Hip Exercises: Aerobic   Tread Mill 5 minutes level 2.4      Knee/Hip Exercises: Machines  for Strengthening   Hip Cybex hip extension 2 x 10 @ 37.5 lbs bilateral; hip abduction 2 x 10 @ 25 lbs bilaterally      Knee/Hip Exercises: Standing   Forward Step Up 10 reps    Forward Step Up Limitations with knee driver x2 bilaterally 10 inch step    Other Standing Knee Exercises sumo squat with 10# KB 2 x 10    Other Standing Knee Exercises opposite touchdown 2 x 10                      PT Short Term Goals - 03/03/21 1052       PT SHORT TERM GOAL #1   Title Patient will be independent with initial HEP    Baseline pt demonstrates independence    Time 1    Period Weeks    Status Achieved    Target Date 03/03/21               PT Long Term Goals - 04/15/21 0810       PT LONG TERM GOAL #1  Title Patient will self-report tolerating at least 30 minutes of walking activity.    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    Time 4    Period Weeks    Status Achieved      PT LONG TERM GOAL #4   Title Patient will squat with proper form without reports of hip pain to improve ability to assist in patient transfers.    Baseline able to complete body weight squat without pain    Time 4    Period Weeks    Status Achieved      PT LONG TERM GOAL #5   Title Patient will score at least 66% function on FOTO to signify clinically meaningful improvement in functional abilities.    Baseline 59% 04/15/21    Time 4    Period Weeks    Status On-going                   Plan - 04/21/21 1457     Clinical Impression  Statement Patient arrives without reports of pain. She continues to do well with treadmill walking at moderate pace without reports of pain. Continued with hip strengthening with patient reporting occasional tightness in the left hip, though no pain. She is demonstrating improved stability with SL tasks on the LLE.    PT Treatment/Interventions ADLs/Self Care Home Management;Cryotherapy;Electrical Stimulation;Iontophoresis '4mg'$ /ml Dexamethasone;Moist Heat;Ultrasound;Stair training;Therapeutic activities;Therapeutic exercise;Balance training;Neuromuscular re-education;Gait training;Patient/family education;Manual techniques;Passive range of motion;Dry needling;Taping    PT Next Visit Plan progress standing strengthening and work specific activity as tolerated, KB walks    PT Home Exercise Plan Access Code [email or web address]             Patient will benefit from skilled therapeutic intervention in order to improve the following deficits and impairments:  Decreased range of motion, Difficulty walking, Decreased activity tolerance, Pain, Decreased strength  Visit Diagnosis: Pain in left hip  Muscle weakness (generalized)  Difficulty in walking, not elsewhere classified  Stiffness of left hip, not elsewhere classified     Problem List Patient Active Problem List   Diagnosis Date Noted   GAD (generalized anxiety disorder) 02/16/2021   Shortness of breath 03/29/2020   Stress and adjustment reaction 02/14/2020   Allergic rhinitis 06/04/2015   Obesity 01/02/2008   Asthma, mild persistent 10/18/2006   Gwendolyn Grant, PT, DPT, ATC 04/21/21 3:41 PM  [facility] [address] [place], [place], [address] Phone: [phone]   Fax:  [phone]  Name: Donna Carlson MRN: [id number] Date of Birth: 01-19-1976

## 2021-04-26 ENCOUNTER — Ambulatory Visit: Payer: 59

## 2021-04-28 ENCOUNTER — Ambulatory Visit: Payer: 59

## 2021-04-28 ENCOUNTER — Other Ambulatory Visit: Payer: Self-pay

## 2021-04-28 DIAGNOSIS — M25652 Stiffness of left hip, not elsewhere classified: Secondary | ICD-10-CM | POA: Diagnosis not present

## 2021-04-28 DIAGNOSIS — M6281 Muscle weakness (generalized): Secondary | ICD-10-CM

## 2021-04-28 DIAGNOSIS — M25552 Pain in left hip: Secondary | ICD-10-CM

## 2021-04-28 DIAGNOSIS — R262 Difficulty in walking, not elsewhere classified: Secondary | ICD-10-CM

## 2021-04-28 NOTE — Therapy (Signed)
[place], [place], [address] Phone: [phone]   Fax:  [phone]  Physical Therapy Treatment/Re-certification/Discharge  Patient Details  Name: Donna Carlson MRN: [id number] Date of Birth: 07-21-1976 Referring Provider (PT): Susa Day, MD   Encounter Date: 04/28/2021   PT End of Session - 04/28/21 1748     Visit Number 13    Number of Visits 14    Date for PT Re-Evaluation --   n/a discharge   Authorization Type Workers Comp    Authorization Time Period 6 visits approved until 9/12    Authorization - Visit Number 5    Authorization - Number of Visits 6    PT Start Time 2094    PT Stop Time 1803    PT Time Calculation (min) 15 min    Activity Tolerance Patient tolerated treatment well    Behavior During Therapy [facility] for tasks assessed/performed             Past Medical History:  Diagnosis Date   Asthma    COVID-19 virus infection 12/10/2019   Palpitations    Tinea pedis 01/27/2019   Toe injury, left, initial encounter 01/27/2019   UTI symptoms 04/01/2020    Past Surgical History:  Procedure Laterality Date   GALLBLADDER SURGERY     TUBAL LIGATION      There were no vitals filed for this visit.   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 She has f/u with MD on Monday.    How long can you sit comfortably? no pain with sitting    How long can you stand comfortably? maybe 35-45 minutes    How long can you walk comfortably? about 35-45 minutes    Currently in Pain? No/denies                [name] PT Assessment - 04/28/21 0001       Assessment   Medical Diagnosis M25.552 (ICD-10-CM) - Pain in left hip    Referring Provider (PT) Susa Day, MD      Observation/Other Assessments   Focus on Therapeutic Outcomes (FOTO)  64%  function      AROM   Overall AROM Comments hip AROM full and pain free      PROM   Overall PROM Comments full and pain free hip PROM      Strength   Left Hip Flexion 5/5    Left Hip Extension 5/5    Left Hip External Rotation 5/5    Left Hip Internal Rotation 5/5    Left Hip ABduction 5/5    Left Hip ADduction 5/5                           [facility] Adult PT Treatment/Exercise - 04/28/21 0001       Self-Care   Other Self-Care Comments  see patient education                     PT Education - 04/28/21 1812     Education Details education on re-assessment findings, D/C education, FOTO score, reviewed HEP.    Person(s) Educated Patient    Methods Explanation;Handout    Comprehension Verbalized understanding              PT Short  Term Goals - 03/03/21 1052       PT SHORT TERM GOAL #1   Title Patient will be independent with initial HEP    Baseline pt demonstrates independence    Time 1    Period Weeks    Status Achieved    Target Date 03/03/21               PT Long Term Goals - 04/28/21 1754       PT LONG TERM GOAL #1   Title Patient will self-report tolerating at least 30 minutes of walking activity.    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    Time 4    Period Weeks    Status Achieved      PT LONG TERM GOAL #4   Title Patient will squat with proper form without reports of hip pain to improve ability to assist in patient transfers.    Baseline able to complete body weight squat without pain    Time 4     Period Weeks    Status Achieved      PT LONG TERM GOAL #5   Title Patient will score at least 66% function on FOTO to signify clinically meaningful improvement in functional abilities.    Baseline 64%    Time 4    Period Weeks    Status On-going                   Plan - 04/28/21 1757     Clinical Impression Statement Luvena has made excellent functional progress since the start of care having met all functional goals with exception of her FOTO outcome score. She demonstrates full and pain free hip AROM and strength and can tolerate standing/walking activity for about 45 minutes at this time. She has met her maximal rehab potential at this time and is appropriate for D/C.    PT Treatment/Interventions ADLs/Self Care Home Management;Cryotherapy;Electrical Stimulation;Iontophoresis 43m/ml Dexamethasone;Moist Heat;Ultrasound;Stair training;Therapeutic activities;Therapeutic exercise;Balance training;Neuromuscular re-education;Gait training;Patient/family education;Manual techniques;Passive range of motion;Dry needling;Taping    PT Next Visit Plan --    PT Home Exercise Plan Access Code [email or web address]    Consulted and Agree with Plan of Care Patient             Patient will benefit from skilled therapeutic intervention in order to improve the following deficits and impairments:  Decreased range of motion, Difficulty walking, Decreased activity tolerance, Pain, Decreased strength  Visit Diagnosis: Pain in left hip  Muscle weakness (generalized)  Difficulty in walking, not elsewhere classified  Stiffness of left hip, not elsewhere classified     Problem List Patient Active Problem List   Diagnosis Date Noted   GAD (generalized anxiety disorder) 02/16/2021   Shortness of breath 03/29/2020   Stress and adjustment reaction 02/14/2020   Allergic rhinitis 06/04/2015   Obesity 01/02/2008   Asthma, mild persistent 10/18/2006  PHYSICAL THERAPY DISCHARGE  SUMMARY  Visits from  Start of Care: 13  Current functional level related to goals / functional outcomes: See goals above   Remaining deficits: See impression above   Education / Equipment: See education above    Patient agrees to discharge. Patient goals were partially met. Patient is being discharged due to maximized rehab potential.   Gwendolyn Grant, PT, DPT, ATC 04/28/21 6:25 PM   [facility] [address] [place], [place], [address] Phone: [phone]   Fax:  [phone]  Name: TABITA CORBO MRN: [id number] Date of Birth: Jan 04, 1976

## 2021-07-17 ENCOUNTER — Other Ambulatory Visit (HOSPITAL_COMMUNITY): Payer: Self-pay

## 2021-07-25 ENCOUNTER — Other Ambulatory Visit: Payer: Self-pay

## 2021-07-25 ENCOUNTER — Encounter: Payer: Self-pay | Admitting: Student

## 2021-07-25 ENCOUNTER — Ambulatory Visit: Payer: 59 | Admitting: Student

## 2021-07-25 ENCOUNTER — Other Ambulatory Visit (HOSPITAL_COMMUNITY): Payer: Self-pay

## 2021-07-25 VITALS — BP 125/84 | HR 94 | Wt 220.5 lb

## 2021-07-25 DIAGNOSIS — U099 Post covid-19 condition, unspecified: Secondary | ICD-10-CM

## 2021-07-25 DIAGNOSIS — E669 Obesity, unspecified: Secondary | ICD-10-CM

## 2021-07-25 DIAGNOSIS — Z6841 Body Mass Index (BMI) 40.0 and over, adult: Secondary | ICD-10-CM | POA: Diagnosis not present

## 2021-07-25 DIAGNOSIS — J4530 Mild persistent asthma, uncomplicated: Secondary | ICD-10-CM | POA: Diagnosis not present

## 2021-07-25 DIAGNOSIS — E66813 Obesity, class 3: Secondary | ICD-10-CM

## 2021-07-25 DIAGNOSIS — Z6837 Body mass index (BMI) 37.0-37.9, adult: Secondary | ICD-10-CM

## 2021-07-25 DIAGNOSIS — J45909 Unspecified asthma, uncomplicated: Secondary | ICD-10-CM | POA: Diagnosis not present

## 2021-07-25 DIAGNOSIS — F411 Generalized anxiety disorder: Secondary | ICD-10-CM

## 2021-07-25 DIAGNOSIS — E66812 Obesity, class 2: Secondary | ICD-10-CM

## 2021-07-25 MED ORDER — ALBUTEROL SULFATE (2.5 MG/3ML) 0.083% IN NEBU
2.5000 mg | INHALATION_SOLUTION | RESPIRATORY_TRACT | 1 refills | Status: DC | PRN
  Filled 2021-07-25: qty 75, 4d supply, fill #0
  Filled 2021-12-16 – 2022-06-27 (×3): qty 75, 4d supply, fill #1

## 2021-07-25 MED ORDER — FLUTICASONE-SALMETEROL 500-50 MCG/ACT IN AEPB
1.0000 | INHALATION_SPRAY | Freq: Two times a day (BID) | RESPIRATORY_TRACT | 3 refills | Status: DC
Start: 2021-07-25 — End: 2022-09-14
  Filled 2021-07-25: qty 60, 30d supply, fill #0
  Filled 2021-12-15: qty 60, 30d supply, fill #1
  Filled 2022-05-01 – 2022-06-05 (×3): qty 60, 30d supply, fill #2

## 2021-07-25 MED ORDER — SEMAGLUTIDE(0.25 OR 0.5MG/DOS) 2 MG/1.5ML ~~LOC~~ SOPN
0.2500 mg | PEN_INJECTOR | SUBCUTANEOUS | 3 refills | Status: DC
  Filled 2021-07-25: qty 1.5, 56d supply, fill #0
  Filled 2021-09-05: qty 1.5, 28d supply, fill #1

## 2021-07-25 MED ORDER — ALBUTEROL SULFATE HFA 108 (90 BASE) MCG/ACT IN AERS
2.0000 | INHALATION_SPRAY | RESPIRATORY_TRACT | 0 refills | Status: DC | PRN
  Filled 2021-07-25: qty 18, 17d supply, fill #0

## 2021-07-25 NOTE — Assessment & Plan Note (Signed)
Has brain fog and shortness of breath. -continue asthma treatment

## 2021-07-25 NOTE — Progress Notes (Signed)
    SUBJECTIVE:   CHIEF COMPLAINT / HPI: Discuss weight loss medicine  Obesity At last visit counseling was provided to the patient on exercise and diet modifications as well as the potential for GLP-1 agonist semaglutide.  Today she is currently interested in semaglutide.  Denies any history of medullary thyroid cancer or endocrine disorders. She is not interested in nutrition counseling.  She has been walking especially during her job in the [employer].  BMI today 23.06.  Up 2 pounds from last visit currently 220 pounds.  Persistent asthma Since she is ran out of her Advair and albuterol inhaler on Friday.  She has had some shortness of breath but mostly when walking and she attributes this to her weight gain.  Anxiety She says she feels more anxious than depressed.  Did not try Prozac that was prescribed last time given afraid of side effects of increased suicidal ideations.  Is interested in counseling.  Long Covid symptoms Says she has been suffering from brain fog and was wondering if there are any treatments for this.  PERTINENT  PMH / PSH: asthma, obesity, gad  OBJECTIVE:   BP 125/84   Pulse 94   Wt 220 lb 8 oz (100 kg)   LMP 06/26/2021   SpO2 100%   BMI 43.06 kg/m   General: Well appearing, NAD, awake, alert, responsive to questions Head: Normocephalic atraumatic CV: Regular rate and rhythm no murmurs rubs or gallops Respiratory: Clear to ausculation bilaterally, no wheezes rales or crackles, chest rises symmetrically Abdomen: Soft, non-tender, non-distended, normoactive bowel sounds  Extremities: Moves upper and lower extremities freely, no edema in LE Neuro: No focal deficits Skin: No rashes or lesions visualized   ASSESSMENT/PLAN:   Asthma, mild persistent No wheezing on exam today. -refilled albuterol inhaler, albuterol nebs, and advair   Obesity Not interested in seeing nutritionist. Encourage physical activity/lifestyle changes. BMI 43.  -Semaglutide  prescribed 0.25 mg weekly for 4 weeks, 0.5 mg weekly for 4 weeks after, follow up after this. -Encouraged lifestyle changes -F/u 2 months to see how weight loss going -Consider MWM referral if issues filling ozempic  GAD (generalized anxiety disorder) Will attempt counseling first before medication changes -Counseling resources provided on AVS     Gerrit Heck, MD [place]

## 2021-07-25 NOTE — Assessment & Plan Note (Signed)
Not interested in seeing nutritionist. Encourage physical activity/lifestyle changes. BMI 43.  -Semaglutide prescribed 0.25 mg weekly for 4 weeks, 0.5 mg weekly for 4 weeks after, follow up after this. -Encouraged lifestyle changes -F/u 2 months to see how weight loss going -Consider MWM referral if issues filling ozempic

## 2021-07-25 NOTE — Patient Instructions (Signed)
It was great to see you! Thank you for allowing me to participate in your care!   I recommend that you always bring your medications to each appointment as this makes it easy to ensure we are on the correct medications and helps [place] not miss when refills are needed.  Our plans for today:  - I refilled your asthma medications - I sent in semaglutide, please inject 0.25 mg once a week for your first 4 weeks. Then, inject 0.50 mg once a week for your next 4 weeks.  - I have listed counseling resources below - we will follow this up in 2 months  Therapy and Counseling Resources Most providers on this list will take Medicaid. Patients with commercial insurance or Medicare should contact their insurance company to get a list of in network providers.  BestDay:Psychiatry and Counseling [address] [facility] [place]. [place], [place] [address] [place], [place], [place] [address]      [place] [address]  [place], [place] [address] [phone]  [place] [address], [place]  [place], [place] [address]       [phone]     [facility] (virtual only) [phone]  Jinny Blossom [facility] [address] [place], [place]  Family [facility]:  [place]. [place] [phone]  [facility]:  [place] [address] [place] [phone]  [facility] (under & uninsured) [address], [place] [phone]    [email or web address]    [place] [address]  [place]    [phone]  [facility] [place]     Phone:  [phone]     [place]  [place] [address]      [place], [place] ext [place]: [place], [place], [place]   [place] ([place] therapist) [email or web address]  [place] [address]   [place] [address]    [phone]    [facility]   [address],  [place], [place]   [place]  [place]  [facility]  [address] [place], [place]        [phone]  Open Access/Walk In Clinic under & uninsured  [facility]  [address] [place], [place] Crisis [phone]  [facility] [place],  ([place])   [place], [place]: [phone]) 8:30 - 12; 1 - 2:30  [facility] of the [place],  [place], [place]    [phone]):8:30 - 12; 2 - 3PM  [facility] [employer],  [address],  [place]; [phone]):   Mon - Fri 8 AM - 5 PM  [facility] [place]  MWF 12:30 to 3:00 or call to schedule an appointment  [phone]  Specific Provider options Psychology Today  [email or web address] click on find a therapist  enter your zip code left side and select or tailor a therapist for your specific need.   [facility] Provider Directory [email or web address]  (Medicaid)   Follow all drop down to find a provider  Social Support program [place]) [phone] or [email or web address] [address], [place], [place] Recovery support and educational   24- Hour Availability:   [facility]  [address] [place], [place] Crisis [phone]  [facility] of the [employer] [phone]  [name]  [phone]   [place]  [phone] (after hours)  Therapeutic Alternative/Mobile Crisis   [phone]  [place] National Suicide Hotline   [phone] [place]ina [place])  Call 911 or go to emergency room  [facility]  [phone]);  [place] and [employer]  [phone]); [place], [place], [place], [place], [place], [place], [place]    Take care and seek immediate care sooner if you develop any concerns. Please remember to show up 15 minutes before your scheduled appointment time!  Gerrit Heck, MD [place]

## 2021-07-25 NOTE — Progress Notes (Deleted)
.  fpts

## 2021-07-25 NOTE — Assessment & Plan Note (Signed)
No wheezing on exam today. -refilled albuterol inhaler, albuterol nebs, and advair

## 2021-07-25 NOTE — Assessment & Plan Note (Signed)
Will attempt counseling first before medication changes -Counseling resources provided on AVS

## 2021-09-05 ENCOUNTER — Other Ambulatory Visit (HOSPITAL_COMMUNITY): Payer: Self-pay

## 2021-09-07 ENCOUNTER — Other Ambulatory Visit (HOSPITAL_COMMUNITY): Payer: Self-pay

## 2021-09-20 ENCOUNTER — Encounter: Payer: Self-pay | Admitting: Student

## 2021-10-06 ENCOUNTER — Other Ambulatory Visit: Payer: Self-pay

## 2021-10-06 ENCOUNTER — Encounter: Payer: Self-pay | Admitting: Student

## 2021-10-06 ENCOUNTER — Ambulatory Visit: Payer: 59 | Admitting: Student

## 2021-10-06 ENCOUNTER — Other Ambulatory Visit (HOSPITAL_COMMUNITY): Payer: Self-pay

## 2021-10-06 VITALS — BP 122/74 | HR 96 | Ht 60.0 in | Wt 209.6 lb

## 2021-10-06 DIAGNOSIS — J4530 Mild persistent asthma, uncomplicated: Secondary | ICD-10-CM | POA: Diagnosis not present

## 2021-10-06 DIAGNOSIS — J45909 Unspecified asthma, uncomplicated: Secondary | ICD-10-CM

## 2021-10-06 DIAGNOSIS — R519 Headache, unspecified: Secondary | ICD-10-CM

## 2021-10-06 DIAGNOSIS — Z6841 Body Mass Index (BMI) 40.0 and over, adult: Secondary | ICD-10-CM | POA: Diagnosis not present

## 2021-10-06 MED ORDER — SEMAGLUTIDE (1 MG/DOSE) 4 MG/3ML ~~LOC~~ SOPN
1.0000 mg | PEN_INJECTOR | SUBCUTANEOUS | 1 refills | Status: DC
  Filled 2021-10-06: qty 3, 28d supply, fill #0
  Filled 2021-11-14: qty 3, 28d supply, fill #1

## 2021-10-06 MED ORDER — MONTELUKAST SODIUM 10 MG PO TABS
10.0000 mg | ORAL_TABLET | Freq: Every day | ORAL | 3 refills | Status: DC
  Filled 2021-10-06: qty 90, 90d supply, fill #0
  Filled 2022-05-12 – 2022-06-27 (×3): qty 90, 90d supply, fill #1

## 2021-10-06 NOTE — Patient Instructions (Addendum)
It was great to see you! Thank you for allowing me to participate in your care!   I recommend that you always bring your medications to each appointment as this makes it easy to ensure we are on the correct medications and helps [place] not miss when refills are needed.  Our plans for today:  - I refilled your semaglutide, please take 1 mg dose weekly for 4 weeks - Please follow up in 2 weeks for your headache and in 4 weeks for your semaglutide -refilled your singulair  We are checking some labs today, I will call you if they are abnormal will send you a MyChart message or a letter if they are normal.  If you do not hear about your labs in the next 2 weeks please let us know.  Take care and seek immediate care sooner if you develop any concerns. Please remember to show up 15 minutes before your scheduled appointment time!  Gerrit Heck, MD [place]

## 2021-10-06 NOTE — Progress Notes (Signed)
SUBJECTIVE:   CHIEF COMPLAINT / HPI:   Semaglutide F/u  Has been doing well with titration from 0.25 to 0.5 mg in the last two months. Was 220 pounds at last visit in December and is now 209 pounds. Denies any abdominal pain, constipation, diarrhea, nausea or vomiting.   New-Onset Headache Right sided headache for the past 2 weeks . This is new for her and is everyday at night.  States that does last for hours.  She takes ibuprofen around the same time every night and this helps.  Says that he does not have any night sweats.  Says in terms of caffeine use only drinks hot chocolate in the mornings does occasionally drink alcohol but not nightly.  Denies any blurriness in one eye or jaw pain pain in the temporal region.  Does not have right eye tearing or orbital pain.  Does not have any nausea or vomiting.  She does not have any sided weakness during the events or doctor.  She says she has not had a history of headaches before this.  She feels like this is due to stress in her life as well.  PERTINENT  PMH / PSH: Obesity  OBJECTIVE:   BP 122/74    Pulse 96    Ht 5' (1.524 m)    Wt 209 lb 9.6 oz (95.1 kg)    LMP 10/01/2021 (Exact Date)    SpO2 98%    BMI 40.93 kg/m   General: NAD, awake, alert, responsive to questions Head: Normocephalic atraumatic, no temporal tenderness, neck ROM intact CV: Regular rate and rhythm no murmurs rubs or gallops Respiratory: Clear to ausculation bilaterally Extremities: Moves upper and lower extremities freely, no edema in LE Neuro: CN II: PERRL CN III, IV,VI: EOMI CV V: Normal sensation in V1, V2, V3 CVII: Symmetric smile and brow raise CN VIII: Normal hearing CN IX,X: Symmetric palate raise  CN XI: 5/5 shoulder shrug CN XII: Symmetric tongue protrusion  UE and LE strength 5/5 Normal sensation in UE and LE bilaterally  No gait issues Skin: No rashes or lesions visualized   ASSESSMENT/PLAN:   Obesity Down 11 pounds since last vist,  congratulated patient -refilled semaglutide 1 mg weekly for 4 weeks -BMP to assess kidney fn -Will monitor in 4 weeks for progress  Asthma, mild persistent Refilled montelukast  New onset headache Happens every night when going to bed an no specific trigger for the past 2 weeks.  Has never had headaches before. DDx includes headaches given patient takes NSAIDs at the same time every day. Tension headache possible although is unilateral which makes it less likely.  Migraines possibility given gradual onset however patient did not have any nausea/aura/photophobia.  Less likely to be cluster although it is unilateral patient says it does not start with orbital pain and denies any tearing.  Unlikely to be related to temporal arteritis given patient has no visual changes in eye and no temporal sensitivity when palpating no jaw claudication symptoms. Tumor less likely given no night sweat (weight loss likely from semaglutide starting). Low risk features include patient being less than 50, no abnormal neurologic findings on examination. Appears well on examination. However it is a new headache for her so will monitor closely.  -Will defer sed rate given low suspicion for temporal arteritis -f/u in 2 weeks to see if worsening -strict return precautions were discussed with patient -patient following up with her eye doctor    Gerrit Heck, [place]

## 2021-10-06 NOTE — Assessment & Plan Note (Signed)
Refilled montelukast 

## 2021-10-06 NOTE — Assessment & Plan Note (Signed)
Down 11 pounds since last vist, congratulated patient -refilled semaglutide 1 mg weekly for 4 weeks -BMP to assess kidney fn -Will monitor in 4 weeks for progress

## 2021-10-06 NOTE — Assessment & Plan Note (Signed)
Happens every night when going to bed an no specific trigger for the past 2 weeks.  Has never had headaches before. DDx includes headaches given patient takes NSAIDs at the same time every day. Tension headache possible although is unilateral which makes it less likely.  Migraines possibility given gradual onset however patient did not have any nausea/aura/photophobia.  Less likely to be cluster although it is unilateral patient says it does not start with orbital pain and denies any tearing.  Unlikely to be related to temporal arteritis given patient has no visual changes in eye and no temporal sensitivity when palpating no jaw claudication symptoms. Tumor less likely given no night sweat (weight loss likely from semaglutide starting). Low risk features include patient being less than 50, no abnormal neurologic findings on examination. Appears well on examination. However it is a new headache for her so will monitor closely.  -Will defer sed rate given low suspicion for temporal arteritis -f/u in 2 weeks to see if worsening -strict return precautions were discussed with patient -patient following up with her eye doctor

## 2021-10-07 LAB — BASIC METABOLIC PANEL
BUN/Creatinine Ratio: 13 (ref 9–23)
BUN: 11 mg/dL (ref 6–24)
CO2: 24 mmol/L (ref 20–29)
Calcium: 9.7 mg/dL (ref 8.7–10.2)
Chloride: 103 mmol/L (ref 96–106)
Creatinine, Ser: 0.84 mg/dL (ref 0.57–1.00)
Glucose: 90 mg/dL (ref 70–99)
Potassium: 4.8 mmol/L (ref 3.5–5.2)
Sodium: 141 mmol/L (ref 134–144)
eGFR: 88 mL/min/{1.73_m2} (ref >59)

## 2021-11-14 ENCOUNTER — Other Ambulatory Visit (HOSPITAL_COMMUNITY): Payer: Self-pay

## 2021-11-30 DIAGNOSIS — H524 Presbyopia: Secondary | ICD-10-CM | POA: Diagnosis not present

## 2021-11-30 DIAGNOSIS — H52223 Regular astigmatism, bilateral: Secondary | ICD-10-CM | POA: Diagnosis not present

## 2021-11-30 DIAGNOSIS — H5203 Hypermetropia, bilateral: Secondary | ICD-10-CM | POA: Diagnosis not present

## 2021-12-08 ENCOUNTER — Other Ambulatory Visit (HOSPITAL_COMMUNITY): Payer: Self-pay

## 2021-12-08 ENCOUNTER — Other Ambulatory Visit: Payer: Self-pay | Admitting: Student

## 2021-12-08 ENCOUNTER — Encounter: Payer: Self-pay | Admitting: Student

## 2021-12-08 DIAGNOSIS — J45909 Unspecified asthma, uncomplicated: Secondary | ICD-10-CM

## 2021-12-09 ENCOUNTER — Other Ambulatory Visit (HOSPITAL_COMMUNITY): Payer: Self-pay

## 2021-12-09 MED ORDER — ALBUTEROL SULFATE HFA 108 (90 BASE) MCG/ACT IN AERS
2.0000 | INHALATION_SPRAY | RESPIRATORY_TRACT | 0 refills | Status: DC | PRN
  Filled 2021-12-09: qty 18, 17d supply, fill #0

## 2021-12-09 MED ORDER — OZEMPIC (1 MG/DOSE) 4 MG/3ML ~~LOC~~ SOPN
1.0000 mg | PEN_INJECTOR | SUBCUTANEOUS | 1 refills | Status: DC
  Filled 2021-12-09 – 2021-12-15 (×2): qty 3, 28d supply, fill #0

## 2021-12-12 ENCOUNTER — Other Ambulatory Visit (HOSPITAL_COMMUNITY): Payer: Self-pay

## 2021-12-12 ENCOUNTER — Telehealth: Payer: Self-pay

## 2021-12-12 NOTE — Telephone Encounter (Signed)
A Prior Authorization was initiated for this patients OZEMPIC through CoverMyMeds.  ? ?Key: [id number] ? ?

## 2021-12-14 NOTE — Telephone Encounter (Signed)
Prior Auth for patients medication OZEMPIC denied by [facility] via CoverMyMeds.  ? ?Reason: Our guideline named GLP-1 STEP OVERRIDE (reviewed for Ozempic) requires that you have a diagnosis of type 2 diabetes mellitus (a chronic condition in which your body is resistant to insulin, a hormone that regulates your blood sugar). Your doctor requested this medication for weight loss or weight management. This request was denied because your condition does not meet the requirements for approval. ? ?CoverMyMeds Key: [id number] ? ?Denial letter scanned to chart ? ?

## 2021-12-15 ENCOUNTER — Other Ambulatory Visit (HOSPITAL_COMMUNITY): Payer: Self-pay

## 2021-12-16 ENCOUNTER — Other Ambulatory Visit: Payer: Self-pay | Admitting: Student

## 2021-12-16 ENCOUNTER — Other Ambulatory Visit (HOSPITAL_COMMUNITY): Payer: Self-pay

## 2021-12-16 ENCOUNTER — Telehealth: Payer: Self-pay

## 2021-12-16 ENCOUNTER — Encounter: Payer: Self-pay | Admitting: Student

## 2021-12-16 MED ORDER — WEGOVY 0.25 MG/0.5ML ~~LOC~~ SOAJ
0.2500 mg | SUBCUTANEOUS | 4 refills | Status: DC
  Filled 2021-12-16: qty 0.5, fill #0

## 2021-12-16 MED ORDER — WEGOVY 0.25 MG/0.5ML ~~LOC~~ SOAJ
0.2500 mg | SUBCUTANEOUS | 0 refills | Status: DC
  Filled 2021-12-16 – 2021-12-20 (×2): qty 2, 28d supply, fill #0
  Filled 2022-03-03: qty 2, fill #0
  Filled 2022-03-30 – 2022-04-06 (×2): qty 2, 28d supply, fill #0

## 2021-12-16 NOTE — Telephone Encounter (Signed)
PA request was sent from patients pharmacy for [facility] via CoverMyMeds. ? ?Attempted to start PA request but rec'd message from plan: A previously denied or partially denied PA request has been located for this member. To initiate an appeal or reconsideration please call [phone]. ? ?Will attempt a reconsideration. ?

## 2021-12-16 NOTE — Progress Notes (Signed)
Spoke with patient on the phone on 4/27 that Ozempic was not approved with pre-authorization. Told her I discussed with pharmacy team and that wegovy would be covered and I can send this in starting at the lowest dose. Discussed patient can come in to our pharmacist for education on how to give this medicine to herself. Patient expressed understanding. ?

## 2021-12-19 ENCOUNTER — Other Ambulatory Visit (HOSPITAL_COMMUNITY): Payer: Self-pay

## 2021-12-20 ENCOUNTER — Other Ambulatory Visit (HOSPITAL_COMMUNITY): Payer: Self-pay

## 2021-12-21 ENCOUNTER — Other Ambulatory Visit (HOSPITAL_COMMUNITY): Payer: Self-pay

## 2021-12-27 ENCOUNTER — Other Ambulatory Visit (HOSPITAL_COMMUNITY): Payer: Self-pay

## 2021-12-27 NOTE — Telephone Encounter (Signed)
Refaxed appeal form that [employer] sent to fill out. ? ?

## 2021-12-29 ENCOUNTER — Encounter: Payer: Self-pay | Admitting: Student

## 2022-01-05 NOTE — Telephone Encounter (Addendum)
Prior Auth appeal for patients medication WEGOVY denied by [facility] via CoverMyMeds.   Reason: IN ORDER TO BE APPROVED, PT NEEDS TO ENROLLED IN AN EXERCISE OR CALORIC REDUCTION PROGRAM, OR A WEIGHT LOSS/BEHAVIORAL MODIFICATION PROGRAM.  Denial letter scanned to pt's chart.

## 2022-01-10 ENCOUNTER — Encounter: Payer: Self-pay | Admitting: Student

## 2022-01-10 ENCOUNTER — Other Ambulatory Visit: Payer: Self-pay | Admitting: Student

## 2022-01-10 DIAGNOSIS — Z0000 Encounter for general adult medical examination without abnormal findings: Secondary | ICD-10-CM

## 2022-01-10 NOTE — Progress Notes (Signed)
Called patient and let her know wegovy was denied due to need to be  "ENROLLED IN AN EXERCISE OR CALORIC REDUCTION PROGRAM, OR A WEIGHT LOSS/BEHAVIORAL MODIFICATION PROGRAM" referred to [facility] program and will touch base with pharmacy again. Referred to mammogram per patient request.

## 2022-01-24 ENCOUNTER — Encounter: Payer: Self-pay | Admitting: *Deleted

## 2022-01-25 ENCOUNTER — Encounter: Payer: Self-pay | Admitting: Student

## 2022-02-08 ENCOUNTER — Telehealth: Payer: Self-pay

## 2022-02-08 NOTE — Telephone Encounter (Signed)
Returning her call from 6/20; she is asking about PREP program; explained program, she wants to attend next July/Aug class at [place] depending on her work schedule. Have emailed Dr Jinny Sanders asking for referral order and shared Epic referral order template. Pt will let me know which class she would like to attend once she confirms her work schedule.

## 2022-02-09 ENCOUNTER — Telehealth: Payer: Self-pay

## 2022-02-09 NOTE — Telephone Encounter (Signed)
She sent me a message letting me know she would like to attend PREP at [facility], 03/21/22, every T/Th 10-11:15; will contact in mid July to confirm and set up assessment visit.

## 2022-02-14 ENCOUNTER — Telehealth: Payer: Self-pay

## 2022-02-14 NOTE — Telephone Encounter (Signed)
Communicating via texts 6/22-6/26 about PREP; offered class schedules at [place] and [place], but having trouble making the dates/times work with her work schedule. Offered her evening Judie Grieve class that starts (today) 6/27; she will check and let us know; update to [facility] RN [facility] at [facility] for follow up if she decides to participate.

## 2022-02-15 NOTE — Progress Notes (Signed)
[facility] PREP Weekly Session  Patient Details  Name: Donna Carlson MRN: [id number] Date of Birth: 12-Jul-1976 Age: 46 y.o. PCP: Gerrit Heck, MD  Vitals:   02/14/22 1800  Weight: 209 lb (94.8 kg)     [facility] Weekly seesion - 02/15/22 1000       [facility] "PREP" Location   [facility] "PREP" Location [facility]      Weekly Session   Topic Discussed Goal setting and welcome to the program   scale of perceived exertion, target heart rate, fit testing done   Classes attended to date 1           Given papers to complete intake. Will do vitals/measurements on Thursday  Pam M Issak [place] 02/15/2022, 10:07 AM

## 2022-03-02 NOTE — Progress Notes (Signed)
[facility] PREP Weekly Session  Patient Details  Name: Donna Carlson MRN: [id number] Date of Birth: August 28, 1975 Age: 46 y.o. PCP: Gerrit Heck, MD  Vitals:   02/28/22 1641  Weight: 206 lb 9.6 oz (93.7 kg)     [facility] Weekly seesion - 03/02/22 1600       [facility] "PREP" Location   [facility] "PREP" Location [facility]      Weekly Session   Topic Discussed Importance of resistance training;Other ways to be active    Minutes exercised this week 360 minutes    Classes attended to date [place] 03/02/2022, 4:42 PM

## 2022-03-03 ENCOUNTER — Other Ambulatory Visit (HOSPITAL_COMMUNITY): Payer: Self-pay

## 2022-03-09 NOTE — Progress Notes (Signed)
[facility] PREP Weekly Session  Patient Details  Name: Donna Carlson MRN: [id number] Date of Birth: 1976-04-25 Age: 46 y.o. PCP: Gerrit Heck, MD  Vitals:   03/07/22 1800  Weight: 207 lb 9.6 oz (94.2 kg)     [facility] Weekly seesion - 03/09/22 1000       [facility] "PREP" Location   [facility] "PREP" Location [facility]      Weekly Session   Topic Discussed Healthy eating tips    Minutes exercised this week 155 minutes    Classes attended to date [place] 03/09/2022, 10:36 AM

## 2022-03-22 NOTE — Progress Notes (Signed)
[facility] PREP Weekly Session  Patient Details  Name: Donna Carlson MRN: [id number] Date of Birth: 10/18/1975 Age: 46 y.o. PCP: Gerrit Heck, MD  Vitals:   03/21/22 1800  Weight: 209 lb 9.6 oz (95.1 kg)     [facility] Weekly seesion - 03/22/22 0900       [facility] "PREP" Location   [facility] "PREP" [facility]      Weekly Session   Topic Discussed Restaurant Eating    Minutes exercised this week 680 minutes    Classes attended to date [place] 03/22/2022, 9:22 AM

## 2022-03-26 DIAGNOSIS — R069 Unspecified abnormalities of breathing: Secondary | ICD-10-CM | POA: Diagnosis not present

## 2022-03-26 DIAGNOSIS — J45909 Unspecified asthma, uncomplicated: Secondary | ICD-10-CM | POA: Diagnosis not present

## 2022-03-26 DIAGNOSIS — R52 Pain, unspecified: Secondary | ICD-10-CM | POA: Diagnosis not present

## 2022-03-26 DIAGNOSIS — R0602 Shortness of breath: Secondary | ICD-10-CM | POA: Diagnosis not present

## 2022-03-26 DIAGNOSIS — R079 Chest pain, unspecified: Secondary | ICD-10-CM | POA: Diagnosis not present

## 2022-03-26 DIAGNOSIS — R0789 Other chest pain: Secondary | ICD-10-CM | POA: Diagnosis not present

## 2022-03-26 DIAGNOSIS — R0689 Other abnormalities of breathing: Secondary | ICD-10-CM | POA: Diagnosis not present

## 2022-03-28 ENCOUNTER — Other Ambulatory Visit (HOSPITAL_COMMUNITY): Payer: Self-pay

## 2022-03-30 ENCOUNTER — Encounter: Payer: Self-pay | Admitting: Student

## 2022-03-31 ENCOUNTER — Other Ambulatory Visit: Payer: Self-pay

## 2022-04-04 ENCOUNTER — Encounter: Payer: Self-pay | Admitting: Student

## 2022-04-04 ENCOUNTER — Telehealth: Payer: Self-pay

## 2022-04-04 NOTE — Telephone Encounter (Signed)
A Prior Authorization (2ND ATTEMPT) was initiated for this patients WEGOVY through CoverMyMeds.   Key: [id number]

## 2022-04-06 ENCOUNTER — Other Ambulatory Visit (HOSPITAL_COMMUNITY): Payer: Self-pay

## 2022-04-06 ENCOUNTER — Encounter: Payer: Self-pay | Admitting: Student

## 2022-04-06 ENCOUNTER — Other Ambulatory Visit: Payer: Self-pay | Admitting: Student

## 2022-04-06 NOTE — Telephone Encounter (Signed)
Prior Auth for patients medication WEGOVY approved by [facility] from 04/05/22 to 10/25/22.  Key: [id number]

## 2022-04-19 NOTE — Progress Notes (Signed)
[facility] PREP Weekly Session  Patient Details  Name: Donna Carlson MRN: [id number] Date of Birth: [birth year] Age: 46 y.o. PCP: Gerrit Heck, MD  Vitals:   04/18/22 1800  Weight: 207 lb 12.8 oz (94.3 kg)     [facility] Weekly seesion - 04/19/22 1100       [facility] "PREP" Location   [facility] "PREP" Location [facility]      Weekly Session   Topic Discussed --   portions and label reading   Minutes exercised this week 1290 minutes    Classes attended to date [place] 04/19/2022, 12:00 PM

## 2022-04-27 ENCOUNTER — Other Ambulatory Visit (HOSPITAL_COMMUNITY)
Admission: RE | Admit: 2022-04-27 | Discharge: 2022-04-27 | Disposition: A | Discharge status: Home / Self Care | Payer: 59 | Source: Ambulatory Visit | Attending: Family Medicine | Admitting: Family Medicine

## 2022-04-27 ENCOUNTER — Other Ambulatory Visit (HOSPITAL_COMMUNITY): Payer: Self-pay

## 2022-04-27 ENCOUNTER — Ambulatory Visit: Payer: 59 | Admitting: Family Medicine

## 2022-04-27 VITALS — BP 110/72 | HR 78 | Ht 60.0 in | Wt 205.0 lb

## 2022-04-27 DIAGNOSIS — Z124 Encounter for screening for malignant neoplasm of cervix: Secondary | ICD-10-CM | POA: Insufficient documentation

## 2022-04-27 DIAGNOSIS — R1030 Lower abdominal pain, unspecified: Secondary | ICD-10-CM | POA: Diagnosis not present

## 2022-04-27 LAB — POCT UA - MICROSCOPIC ONLY: WBC, Ur, HPF, POC: NONE SEEN (ref 0–5)

## 2022-04-27 LAB — POCT URINALYSIS DIP (MANUAL ENTRY)
Bilirubin, UA: NEGATIVE
Glucose, UA: NEGATIVE mg/dL
Ketones, POC UA: NEGATIVE mg/dL
Leukocytes, UA: NEGATIVE
Nitrite, UA: NEGATIVE
Protein Ur, POC: NEGATIVE mg/dL
Spec Grav, UA: 1.025 (ref 1.010–1.025)
Urobilinogen, UA: 0.2 E.U./dL
pH, UA: 6 (ref 5.0–8.0)

## 2022-04-27 MED ORDER — SENNA 8.6 MG PO TABS
1.0000 | ORAL_TABLET | Freq: Every day | ORAL | 0 refills | Status: DC
  Filled 2022-04-27: qty 30, 30d supply, fill #0

## 2022-04-27 MED ORDER — POLYETHYLENE GLYCOL 3350 17 GM/SCOOP PO POWD
17.0000 g | Freq: Every day | ORAL | 0 refills | Status: DC
  Filled 2022-04-27: qty 238, 14d supply, fill #0

## 2022-04-27 NOTE — Patient Instructions (Addendum)
It was great to see you!  It does not look like a UTI but I will send your urine for culture just to be sure.  We did your pap today (screening for cervical cancer). We also did routine STI testing. I will contact you via MyChart early next week with these results.  In the meantime, let's treat your constipation. Take 1 capful of Miralax daily and 1 tablet of Senna. I will send a prescription to your pharmacy by the end of the day.  Take care and seek immediate care sooner if you develop any concerns.  Dr. Edrick [facility]

## 2022-04-27 NOTE — Progress Notes (Signed)
    SUBJECTIVE:   CHIEF COMPLAINT / HPI:   Suprapubic Pain -started 2 days ago -was sitting when it started, not doing anything unusual -described as crampy -constant from Tuesday evening until yesterday morning -Took tylenol without relief -Now  she's having a lot of suprapubic pressure with urinating -Also endorses increased urinary frequency and urgency -Some constipation recently (BM Saturday then next [facility] Wednesday) which is atypical for her. Stool wasn't hard though. Hasn't been drinking much water, recently taking care of her Mom who has cancer -No nausea or vomiting, no vaginal symptoms, no fever -Sexually active with one longstanding partner -started wegovy 2 weeks ago  PERTINENT  PMH / PSH: asthma, obesity  OBJECTIVE:   BP 110/72   Pulse 78   Ht 5' (1.524 m)   Wt 205 lb (93 kg)   LMP 04/04/2022   SpO2 96%   BMI 40.04 kg/m   Gen: NAD, pleasant, able to participate in exam CV: RRR, normal S1/S2, no murmur Resp: Normal effort, lungs CTAB GI: abdomen soft, nontender, nondistended Extremities: no edema or cyanosis Skin: warm and dry, no rashes noted Neuro: alert, no obvious focal deficits Psych: Normal affect and mood GU/GYN: Exam performed in the presence of a chaperone. External genitalia within normal limits.  Vaginal mucosa pink, moist, normal rugae.  Nonfriable cervix without lesions, no discharge or bleeding noted on speculum exam.  Bimanual exam revealed normal, nongravid uterus.  No cervical motion tenderness. No adnexal masses bilaterally.     ASSESSMENT/PLAN:   Lower abdominal pain Acute x2 days, GI and GU exam unremarkable today. Differential includes UTI vs STI vs constipation vs pre-menstrual cramping. Less likely pelvic organ prolapse (none seen on exam) or adverse effect to [facility]. -UA nondiagnostic. Will send urine culture -GC/chlamydia/trich obtained w/pap today -Start Miralax and Senna daily -Return precautions reviewed -f/u if no improvement    Health Maintenance -Pap w/HPV cotesting obtained today  Alcus Dad, MD [place]

## 2022-04-28 NOTE — Assessment & Plan Note (Signed)
Acute x2 days, GI and GU exam unremarkable today. Differential includes UTI vs STI vs constipation vs pre-menstrual cramping. Less likely pelvic organ prolapse (none seen on exam) or adverse effect to [facility] -UA nondiagnostic. Will send urine culture -GC/chlamydia/trich obtained w/pap today -Start Miralax and Senna daily -Return precautions reviewed -f/u if no improvement

## 2022-04-29 LAB — URINE CULTURE

## 2022-05-01 ENCOUNTER — Other Ambulatory Visit (HOSPITAL_COMMUNITY): Payer: Self-pay

## 2022-05-03 NOTE — Progress Notes (Signed)
[facility] PREP Weekly Session  Patient Details  Name: Donna Carlson MRN: [id number] Date of Birth: 03-Aug-1976 Age: 46 y.o. PCP: Gerrit Heck, MD  Vitals:   05/02/22 1800  Weight: 206 lb (93.4 kg)     [facility] Weekly seesion - 05/03/22 1000       [facility] "PREP" Location   [facility] "PREP" Location [facility]      Weekly Session   Topic Discussed Calorie breakdown    Minutes exercised this week 110 minutes    Classes attended to date 19             [place] 05/03/2022, 10:40 AM

## 2022-05-04 LAB — CYTOLOGY - PAP
Adequacy: ABSENT
Chlamydia: NEGATIVE
Comment: NEGATIVE
Comment: NEGATIVE
Comment: NEGATIVE
Comment: NORMAL
Diagnosis: NEGATIVE
High risk HPV: NEGATIVE
Neisseria Gonorrhea: NEGATIVE
Trichomonas: NEGATIVE

## 2022-05-10 ENCOUNTER — Other Ambulatory Visit (HOSPITAL_COMMUNITY): Payer: Self-pay

## 2022-05-12 ENCOUNTER — Other Ambulatory Visit: Payer: Self-pay | Admitting: Student

## 2022-05-12 ENCOUNTER — Encounter: Payer: Self-pay | Admitting: Student

## 2022-05-12 ENCOUNTER — Other Ambulatory Visit (HOSPITAL_COMMUNITY): Payer: Self-pay

## 2022-05-12 DIAGNOSIS — J45909 Unspecified asthma, uncomplicated: Secondary | ICD-10-CM

## 2022-05-12 MED ORDER — WEGOVY 0.5 MG/0.5ML ~~LOC~~ SOAJ
0.5000 mg | SUBCUTANEOUS | 0 refills | Status: DC
  Filled 2022-05-12: qty 2, 28d supply, fill #0

## 2022-05-12 MED ORDER — ALBUTEROL SULFATE HFA 108 (90 BASE) MCG/ACT IN AERS
2.0000 | INHALATION_SPRAY | RESPIRATORY_TRACT | 0 refills | Status: DC | PRN
  Filled 2022-05-12 – 2022-06-05 (×2): qty 6.7, 17d supply, fill #0
  Filled 2022-06-27: qty 6.7, 7d supply, fill #0
  Filled 2022-06-27: qty 6.7, 17d supply, fill #0

## 2022-05-15 ENCOUNTER — Other Ambulatory Visit (HOSPITAL_COMMUNITY): Payer: Self-pay

## 2022-05-16 ENCOUNTER — Other Ambulatory Visit (HOSPITAL_COMMUNITY): Payer: Self-pay

## 2022-05-16 ENCOUNTER — Other Ambulatory Visit: Payer: Self-pay | Admitting: Student

## 2022-05-18 ENCOUNTER — Other Ambulatory Visit: Payer: Self-pay | Admitting: Student

## 2022-05-19 ENCOUNTER — Other Ambulatory Visit (HOSPITAL_COMMUNITY): Payer: Self-pay

## 2022-05-23 ENCOUNTER — Other Ambulatory Visit (HOSPITAL_COMMUNITY): Payer: Self-pay

## 2022-05-26 ENCOUNTER — Other Ambulatory Visit (HOSPITAL_COMMUNITY): Payer: Self-pay

## 2022-05-26 MED ORDER — SEMAGLUTIDE-WEIGHT MANAGEMENT 1 MG/0.5ML ~~LOC~~ SOAJ
1.0000 mg | SUBCUTANEOUS | 0 refills | Status: DC
  Filled 2022-05-26: qty 2, 28d supply, fill #0

## 2022-05-29 ENCOUNTER — Other Ambulatory Visit (HOSPITAL_COMMUNITY): Payer: Self-pay

## 2022-06-01 NOTE — Progress Notes (Signed)
[facility] PREP Evaluation  Patient Details  Name: Donna Carlson MRN: [id number] Date of Birth: 1975-12-18 Age: 46 y.o. PCP: Gerrit Heck, MD  Vitals:   05/29/22 1730  BP: 122/78  Pulse: 88  SpO2: 98%  Weight: 208 lb 9.6 oz (94.6 kg)     [facility] Eval - 06/01/22 1500       [facility] "PREP" Location   [facility] "PREP" Location [facility]      Referral    Referring Provider [place]    Program Start Date --   Final class 9/26     Measurement   Waist Circumference 45 inches    Hip Circumference 47 inches    Body fat 42.6 percent      Mobility and Daily Activities   I find it easy to walk up or down two or more flights of stairs. 3    I have no trouble taking out the trash. 4    I do housework such as vacuuming and dusting on my own without difficulty. 2    I can easily lift a gallon of milk (8lbs). 4    I can easily walk a mile. 4    I have no trouble reaching into high cupboards or reaching down to pick up something from the floor. 2    I do not have trouble doing out-door work such as Armed forces logistics/support/administrative officer, raking leaves, or gardening. 1      Mobility and Daily Activities   I feel younger than my age. 2    I feel independent. 4    I feel energetic. 2    I live an active life.  2    I feel strong. 2    I feel healthy. 2    I feel active as other people my age. 4      How fit and strong are you.   Fit and Strong Total Score 38            Past Medical History:  Diagnosis Date   Asthma    COVID-19 virus infection 12/10/2019   Palpitations    Tinea pedis 01/27/2019   Toe injury, left, initial encounter 01/27/2019   UTI symptoms 04/01/2020   Past Surgical History:  Procedure Laterality Date   GALLBLADDER SURGERY     TUBAL LIGATION     Social History   Tobacco Use  Smoking Status Never  Smokeless Tobacco Never  Attendance: >15 workouts, 8 of 12 educational sessions Cardio march test: 265 to 278 Sit to stand: 11 to 17 Bicep curl: 18 to 33 Balance improved   Encouraged to continue to exercise and manage stress   Barnett Hatter 06/01/2022, 3:51 PM

## 2022-06-05 ENCOUNTER — Other Ambulatory Visit (HOSPITAL_COMMUNITY): Payer: Self-pay

## 2022-06-13 ENCOUNTER — Other Ambulatory Visit (HOSPITAL_COMMUNITY): Payer: Self-pay

## 2022-06-27 ENCOUNTER — Other Ambulatory Visit (HOSPITAL_COMMUNITY): Payer: Self-pay

## 2022-06-28 ENCOUNTER — Encounter: Payer: Self-pay | Admitting: Student

## 2022-07-25 ENCOUNTER — Other Ambulatory Visit (HOSPITAL_COMMUNITY): Payer: Self-pay

## 2022-07-25 ENCOUNTER — Other Ambulatory Visit: Payer: Self-pay | Admitting: Student

## 2022-07-25 DIAGNOSIS — Z6841 Body Mass Index (BMI) 40.0 and over, adult: Secondary | ICD-10-CM

## 2022-07-26 ENCOUNTER — Other Ambulatory Visit (HOSPITAL_COMMUNITY): Payer: Self-pay

## 2022-07-29 MED ORDER — WEGOVY 1.7 MG/0.75ML ~~LOC~~ SOAJ
1.7000 mg | SUBCUTANEOUS | 0 refills | Status: DC
  Filled 2022-07-29: qty 3, 28d supply, fill #0

## 2022-07-31 ENCOUNTER — Other Ambulatory Visit (HOSPITAL_COMMUNITY): Payer: Self-pay

## 2022-08-15 ENCOUNTER — Other Ambulatory Visit (HOSPITAL_COMMUNITY): Payer: Self-pay

## 2022-08-18 ENCOUNTER — Ambulatory Visit: Payer: 59 | Admitting: Student

## 2022-09-04 ENCOUNTER — Encounter: Payer: Self-pay | Admitting: Student

## 2022-09-14 ENCOUNTER — Encounter: Payer: Self-pay | Admitting: Family Medicine

## 2022-09-14 ENCOUNTER — Other Ambulatory Visit (HOSPITAL_COMMUNITY): Payer: Self-pay

## 2022-09-14 ENCOUNTER — Ambulatory Visit: Payer: Commercial Managed Care - PPO | Admitting: Family Medicine

## 2022-09-14 VITALS — BP 133/80 | HR 95 | Ht 64.0 in | Wt 211.4 lb

## 2022-09-14 DIAGNOSIS — J4541 Moderate persistent asthma with (acute) exacerbation: Secondary | ICD-10-CM | POA: Diagnosis not present

## 2022-09-14 DIAGNOSIS — J45909 Unspecified asthma, uncomplicated: Secondary | ICD-10-CM

## 2022-09-14 MED ORDER — FLUTICASONE-SALMETEROL 500-50 MCG/ACT IN AEPB
1.0000 | INHALATION_SPRAY | Freq: Two times a day (BID) | RESPIRATORY_TRACT | 3 refills | Status: DC
  Filled 2022-09-14: qty 60, 30d supply, fill #0

## 2022-09-14 MED ORDER — PREDNISONE 20 MG PO TABS
40.0000 mg | ORAL_TABLET | Freq: Every day | ORAL | 0 refills | Status: AC
  Filled 2022-09-14: qty 10, 5d supply, fill #0

## 2022-09-14 NOTE — Progress Notes (Signed)
    SUBJECTIVE:   CHIEF COMPLAINT / HPI:   Asthma Flare - 2 weeks duration, worsened this Tuesday - started when the weather changed, last known illness was around christmas  - Using nebulizer and inhaler with minimal relief - Hasn't had cough or congestion - Is using the nebulizer at night and having to use her inhaler at least 3-4 times per day - Has not had exacerbation in several years  PERTINENT  PMH / PSH: Reviewed  OBJECTIVE:   BP 133/80   Pulse 95   Ht '5\' 4"'$  (1.626 m)   Wt 211 lb 6.4 oz (95.9 kg)   LMP 08/15/2022 (Exact Date)   SpO2 100%   BMI 36.29 kg/m   Gen: well-appearing, NAD CV: RRR, no m/r/g appreciated, no peripheral edema Pulm: normal WOB, no retractions, mild wheeze diffusely  ASSESSMENT/PLAN:   Asthma exacerbation  Patient with persistent asthma that has previously been well controlled on Advair and albuterol PRN. Presenting with history and physical consistent with mild/mod exacerbation not requiring hospitalization at this time.  - Prednisone '40mg'$  daily x5 days - Strict return and ER precautions discussed - Continue albuterol inhaler and nebulizer PRN   Rise Patience, DO [place]

## 2022-09-14 NOTE — Patient Instructions (Signed)
I am sending in steroids for you to take for the next 5 days. If you are having any worsening over the weekend then please get evaluated at an Urgent Care or the ER. If by Monday you are still feeling like you haven't improved, I want you to come back in the clinic for evaluation.

## 2022-09-20 ENCOUNTER — Other Ambulatory Visit (HOSPITAL_COMMUNITY): Payer: Self-pay

## 2022-09-20 MED ORDER — AMOXICILLIN 500 MG PO CAPS
500.0000 mg | ORAL_CAPSULE | Freq: Three times a day (TID) | ORAL | 1 refills | Status: DC
  Filled 2022-09-20: qty 21, 6d supply, fill #0

## 2022-09-29 ENCOUNTER — Other Ambulatory Visit: Payer: Self-pay | Admitting: Student

## 2022-09-29 ENCOUNTER — Other Ambulatory Visit (HOSPITAL_COMMUNITY): Payer: Self-pay

## 2022-09-29 MED ORDER — WEGOVY 1.7 MG/0.75ML ~~LOC~~ SOAJ
1.7000 mg | SUBCUTANEOUS | 0 refills | Status: DC
  Filled 2022-09-29 – 2022-10-10 (×2): qty 3, 28d supply, fill #0

## 2022-10-09 ENCOUNTER — Other Ambulatory Visit (HOSPITAL_COMMUNITY): Payer: Self-pay

## 2022-10-09 MED ORDER — PENICILLIN V POTASSIUM 500 MG PO TABS
500.0000 mg | ORAL_TABLET | Freq: Four times a day (QID) | ORAL | 0 refills | Status: DC
  Filled 2022-10-09: qty 28, 7d supply, fill #0

## 2022-10-09 MED ORDER — HYDROCODONE-ACETAMINOPHEN 5-325 MG PO TABS
1.0000 | ORAL_TABLET | ORAL | 0 refills | Status: DC | PRN
  Filled 2022-10-09: qty 14, 3d supply, fill #0

## 2022-10-10 ENCOUNTER — Other Ambulatory Visit (HOSPITAL_COMMUNITY): Payer: Self-pay

## 2022-10-31 ENCOUNTER — Other Ambulatory Visit: Payer: Self-pay | Admitting: Student

## 2022-10-31 ENCOUNTER — Other Ambulatory Visit (HOSPITAL_COMMUNITY): Payer: Self-pay

## 2022-10-31 DIAGNOSIS — J45909 Unspecified asthma, uncomplicated: Secondary | ICD-10-CM

## 2022-10-31 MED ORDER — ALBUTEROL SULFATE HFA 108 (90 BASE) MCG/ACT IN AERS
2.0000 | INHALATION_SPRAY | RESPIRATORY_TRACT | 0 refills | Status: DC | PRN
  Filled 2022-10-31 – 2022-11-17 (×2): qty 18, 25d supply, fill #0

## 2022-11-01 ENCOUNTER — Other Ambulatory Visit (HOSPITAL_COMMUNITY): Payer: Self-pay

## 2022-11-02 ENCOUNTER — Other Ambulatory Visit (HOSPITAL_COMMUNITY): Payer: Self-pay

## 2022-11-16 ENCOUNTER — Other Ambulatory Visit (HOSPITAL_COMMUNITY): Payer: Self-pay

## 2022-11-17 ENCOUNTER — Other Ambulatory Visit (HOSPITAL_COMMUNITY): Payer: Self-pay

## 2022-12-07 ENCOUNTER — Ambulatory Visit: Payer: Commercial Managed Care - PPO | Admitting: Family Medicine

## 2022-12-07 ENCOUNTER — Other Ambulatory Visit (HOSPITAL_COMMUNITY)
Admission: RE | Admit: 2022-12-07 | Discharge: 2022-12-07 | Disposition: A | Discharge status: Home / Self Care | Payer: Commercial Managed Care - PPO | Source: Ambulatory Visit | Attending: Family Medicine | Admitting: Family Medicine

## 2022-12-07 ENCOUNTER — Encounter: Payer: Self-pay | Admitting: Family Medicine

## 2022-12-07 ENCOUNTER — Other Ambulatory Visit (HOSPITAL_COMMUNITY): Payer: Self-pay

## 2022-12-07 VITALS — BP 126/87 | HR 84 | Ht 63.0 in | Wt 210.6 lb

## 2022-12-07 DIAGNOSIS — N760 Acute vaginitis: Secondary | ICD-10-CM | POA: Diagnosis not present

## 2022-12-07 DIAGNOSIS — B9689 Other specified bacterial agents as the cause of diseases classified elsewhere: Secondary | ICD-10-CM | POA: Diagnosis not present

## 2022-12-07 DIAGNOSIS — N898 Other specified noninflammatory disorders of vagina: Secondary | ICD-10-CM

## 2022-12-07 DIAGNOSIS — F5089 Other specified eating disorder: Secondary | ICD-10-CM | POA: Diagnosis not present

## 2022-12-07 DIAGNOSIS — Z1159 Encounter for screening for other viral diseases: Secondary | ICD-10-CM

## 2022-12-07 LAB — POCT WET PREP (WET MOUNT)
Clue Cells Wet Prep Whiff POC: POSITIVE
Trichomonas Wet Prep HPF POC: ABSENT

## 2022-12-07 MED ORDER — METRONIDAZOLE 500 MG PO TABS
500.0000 mg | ORAL_TABLET | Freq: Three times a day (TID) | ORAL | 0 refills | Status: DC
  Filled 2022-12-07: qty 21, 7d supply, fill #0

## 2022-12-07 MED ORDER — METRONIDAZOLE 500 MG PO TABS
500.0000 mg | ORAL_TABLET | Freq: Two times a day (BID) | ORAL | 0 refills | Status: AC
  Filled 2022-12-07: qty 14, 7d supply, fill #0

## 2022-12-07 NOTE — Patient Instructions (Signed)
It was wonderful to see you today! Thank you for choosing [facility].   Please bring ALL of your medications with you to every visit.   Today we talked about:  We are checking labs today to assess your vaginal discharge. The point of care testing was positive for bacterial vaginitis. I am sending in medication for that. The Gonorrhea and chlamydia testing will be a day or two and I will call you about those results We are getting am anemia panel to check for low hemoglobin since you have been craving ice and have a history of anemia  Please follow up as need for symptoms   We are checking some labs today. If they are abnormal, I will call you. If they are normal, I will send you a MyChart message (if it is active) or a letter in the mail. If you do not hear about your labs in the next 2 weeks, please call the office.  Call the clinic at [phone] if your symptoms worsen or you have any concerns.  Please be sure to schedule follow up at the front desk before you leave today.   Elberta Fortis, DO Family Medicine

## 2022-12-07 NOTE — Assessment & Plan Note (Addendum)
Reports wanting to persistently eat ice x few months. H/o mild anemia. Last Hgb 11.7 in 2021. Regular menstrual periods, not on hormonal therapy. No sources of bleeding -Anemia profile

## 2022-12-07 NOTE — Progress Notes (Signed)
    SUBJECTIVE:   CHIEF COMPLAINT / HPI:   Vaginal discharge Reports yellow-white clumpy vaginal discharge x 2 days. States vaginal itching started before then. Sexually active last on Friday, occasional condom use. BTL for contraception, LMP on 11/23/2022. Uses sex toys but reports cleaning them with each use. Denies dysuria and abdominal/pelvic pain.  Pica Reports wanting to eat only ice for the past few months. H/o mild anemia, not on iron supplementation currently. Regular periods, not on any hormonal contraceptives as has BTL. No reported bleeding.  PERTINENT  PMH / PSH: Anemia  OBJECTIVE:   BP 126/87   Pulse 84   Ht  (1.6 m)   Wt 210 lb 9.6 oz (95.5 kg)   LMP 11/23/2022   SpO2 100%   BMI 37.31 kg/m    General: NAD, pleasant, able to participate in exam Pelvic exam: normal external genitalia, vulva, vagina, cervix, uterus and adnexa, VAGINA: normal appearing vagina with normal color and discharge, no lesions, vaginal discharge - white and creamy, WET MOUNT done - results: clue cells, exam chaperoned by Burnett Harry, CMA.  Extremities: no edema or cyanosis. Skin: warm and dry, no rashes noted Neuro: alert, no obvious focal deficits Psych: Normal affect and mood  ASSESSMENT/PLAN:   Vaginal discharge Thick, yellow-white vaginal discharge x 2 days. Associated with pruritus. Creamy white discharge upon pelvic exam. POC wet prep positive for clue cells, negative for yeast. BTL for contraception. -Patient opted for oral Metronidazole  BID x 7 days for BV treatment -G/C, trich obtained  Pica Reports wanting to persistently eat ice x few months. H/o mild anemia. Last Hgb 11.7 in 2021. Regular menstrual periods, not on hormonal therapy. No sources of bleeding -Anemia profile   Dr. Elberta Fortis, DO [facility]

## 2022-12-07 NOTE — Assessment & Plan Note (Addendum)
Thick, yellow-white vaginal discharge x 2 days. Associated with pruritus. Creamy white discharge upon pelvic exam. POC wet prep positive for clue cells, negative for yeast. BTL for contraception. -Patient opted for oral Metronidazole  BID x 7 days for BV treatment -G/C, trich obtained

## 2022-12-08 ENCOUNTER — Telehealth: Payer: Self-pay | Admitting: Family Medicine

## 2022-12-08 LAB — ANEMIA PROFILE B
Basophils Absolute: 0.1 10*3/uL (ref 0.0–0.2)
Basos: 1 %
EOS (ABSOLUTE): 0.2 10*3/uL (ref 0.0–0.4)
Eos: 4 %
Ferritin: 15 ng/mL (ref 15–150)
Folate: 6.3 ng/mL (ref >3.0)
Hematocrit: 33.9 % — ABNORMAL LOW (ref 34.0–46.6)
Hemoglobin: 10.7 g/dL — ABNORMAL LOW (ref 11.1–15.9)
Immature Grans (Abs): 0 10*3/uL (ref 0.0–0.1)
Immature Granulocytes: 0 %
Iron Saturation: 12 % — ABNORMAL LOW (ref 15–55)
Iron: 48 ug/dL (ref 27–159)
Lymphocytes Absolute: 1.7 10*3/uL (ref 0.7–3.1)
Lymphs: 34 %
MCH: 26.6 pg (ref 26.6–33.0)
MCHC: 31.6 g/dL (ref 31.5–35.7)
MCV: 84 fL (ref 79–97)
Monocytes Absolute: 0.3 10*3/uL (ref 0.1–0.9)
Monocytes: 6 %
Neutrophils Absolute: 2.7 10*3/uL (ref 1.4–7.0)
Neutrophils: 55 %
Platelets: 309 10*3/uL (ref 150–450)
RBC: 4.02 x10E6/uL (ref 3.77–5.28)
RDW: 14 % (ref 11.7–15.4)
Retic Ct Pct: 1.7 % (ref 0.6–2.6)
Total Iron Binding Capacity: 386 ug/dL (ref 250–450)
UIBC: 338 ug/dL (ref 131–425)
Vitamin B-12: 829 pg/mL (ref 232–1245)
WBC: 5 10*3/uL (ref 3.4–10.8)

## 2022-12-08 NOTE — Telephone Encounter (Signed)
Spoke with patient regarding anemia, Hgb 10.7, Ferritin 15. Recommend oral iron supplementation every other day to reduce risk of constipation. Patient to pick up OTC iron (ferrous sulfate ) at Pharmacy, added to medication list. Recommend repeat anemia testing in 3 months.  Elberta Fortis, DO

## 2022-12-11 LAB — CERVICOVAGINAL ANCILLARY ONLY
Chlamydia: NEGATIVE
Comment: NEGATIVE
Comment: NEGATIVE
Comment: NORMAL
Neisseria Gonorrhea: NEGATIVE
Trichomonas: NEGATIVE

## 2023-01-09 DIAGNOSIS — F4323 Adjustment disorder with mixed anxiety and depressed mood: Secondary | ICD-10-CM | POA: Diagnosis not present

## 2023-01-23 ENCOUNTER — Encounter: Payer: Self-pay | Admitting: Student

## 2023-01-24 ENCOUNTER — Encounter: Payer: Self-pay | Admitting: Student

## 2023-01-24 ENCOUNTER — Other Ambulatory Visit (HOSPITAL_COMMUNITY): Payer: Self-pay

## 2023-03-05 ENCOUNTER — Other Ambulatory Visit: Payer: Self-pay | Admitting: Student

## 2023-03-05 ENCOUNTER — Other Ambulatory Visit (HOSPITAL_COMMUNITY): Payer: Self-pay

## 2023-03-05 DIAGNOSIS — J45909 Unspecified asthma, uncomplicated: Secondary | ICD-10-CM

## 2023-03-06 ENCOUNTER — Other Ambulatory Visit (HOSPITAL_COMMUNITY): Payer: Self-pay

## 2023-03-06 MED ORDER — ALBUTEROL SULFATE HFA 108 (90 BASE) MCG/ACT IN AERS
2.0000 | INHALATION_SPRAY | RESPIRATORY_TRACT | 0 refills | Status: DC | PRN
  Filled 2023-03-06: qty 6.7, 25d supply, fill #0
  Filled 2023-03-14 (×2): qty 6.7, 25d supply, fill #1

## 2023-03-07 ENCOUNTER — Other Ambulatory Visit (HOSPITAL_COMMUNITY): Payer: Self-pay

## 2023-03-14 ENCOUNTER — Encounter: Payer: Self-pay | Admitting: Student

## 2023-03-14 ENCOUNTER — Other Ambulatory Visit: Payer: Self-pay | Admitting: Student

## 2023-03-14 ENCOUNTER — Other Ambulatory Visit (HOSPITAL_COMMUNITY): Payer: Self-pay

## 2023-03-14 DIAGNOSIS — J45909 Unspecified asthma, uncomplicated: Secondary | ICD-10-CM

## 2023-03-15 MED ORDER — ALBUTEROL SULFATE HFA 108 (90 BASE) MCG/ACT IN AERS
2.0000 | INHALATION_SPRAY | RESPIRATORY_TRACT | 0 refills | Status: DC | PRN
  Filled 2023-03-15: qty 18, 68d supply, fill #0
  Filled 2023-03-19: qty 18, 17d supply, fill #0
  Filled 2023-03-28: qty 18, 68d supply, fill #0

## 2023-03-16 ENCOUNTER — Other Ambulatory Visit (HOSPITAL_COMMUNITY): Payer: Self-pay

## 2023-03-19 ENCOUNTER — Other Ambulatory Visit (HOSPITAL_COMMUNITY): Payer: Self-pay

## 2023-03-23 ENCOUNTER — Other Ambulatory Visit (HOSPITAL_COMMUNITY): Payer: Self-pay

## 2023-03-28 ENCOUNTER — Other Ambulatory Visit (HOSPITAL_COMMUNITY): Payer: Self-pay

## 2023-03-29 ENCOUNTER — Other Ambulatory Visit (HOSPITAL_COMMUNITY): Payer: Self-pay

## 2023-03-29 MED ORDER — ALBUTEROL SULFATE HFA 108 (90 BASE) MCG/ACT IN AERS
2.0000 | INHALATION_SPRAY | RESPIRATORY_TRACT | 0 refills | Status: DC | PRN
Start: 2023-03-29 — End: 2023-07-28

## 2023-03-29 NOTE — Addendum Note (Signed)
Addended by: Levin Erp on: 03/29/2023 01:41 PM   Modules accepted: Orders

## 2023-03-29 NOTE — Telephone Encounter (Signed)
I called [facility] and she picked up the generic form of Ventolin a couple of weeks ago.   They report not carrying Ventolin anymore.   Can we send this to [facility].

## 2023-04-12 DIAGNOSIS — F4323 Adjustment disorder with mixed anxiety and depressed mood: Secondary | ICD-10-CM | POA: Diagnosis not present

## 2023-04-25 ENCOUNTER — Encounter: Payer: Self-pay | Admitting: Family Medicine

## 2023-04-25 ENCOUNTER — Other Ambulatory Visit (HOSPITAL_COMMUNITY): Payer: Self-pay

## 2023-04-25 ENCOUNTER — Ambulatory Visit: Payer: Commercial Managed Care - PPO | Admitting: Family Medicine

## 2023-04-25 VITALS — BP 118/78 | HR 77 | Ht 62.0 in | Wt 215.4 lb

## 2023-04-25 DIAGNOSIS — J4531 Mild persistent asthma with (acute) exacerbation: Secondary | ICD-10-CM | POA: Diagnosis not present

## 2023-04-25 DIAGNOSIS — J45909 Unspecified asthma, uncomplicated: Secondary | ICD-10-CM

## 2023-04-25 MED ORDER — FLUTICASONE-SALMETEROL 500-50 MCG/ACT IN AEPB
1.0000 | INHALATION_SPRAY | Freq: Two times a day (BID) | RESPIRATORY_TRACT | 3 refills | Status: DC
  Filled 2023-04-25: qty 60, 30d supply, fill #0
  Filled 2023-07-28: qty 60, 30d supply, fill #1

## 2023-04-25 MED ORDER — PREDNISONE 20 MG PO TABS
40.0000 mg | ORAL_TABLET | Freq: Every day | ORAL | 0 refills | Status: AC
  Filled 2023-04-25: qty 10, 5d supply, fill #0

## 2023-04-25 MED ORDER — ALBUTEROL SULFATE (2.5 MG/3ML) 0.083% IN NEBU
2.5000 mg | INHALATION_SOLUTION | RESPIRATORY_TRACT | 1 refills | Status: DC | PRN
Start: 2023-04-25 — End: 2024-05-16
  Filled 2023-04-25: qty 75, 5d supply, fill #0

## 2023-04-25 MED ORDER — MONTELUKAST SODIUM 10 MG PO TABS
10.0000 mg | ORAL_TABLET | Freq: Every day | ORAL | 3 refills | Status: AC
Start: 2023-04-25 — End: ?
  Filled 2023-04-25: qty 90, 90d supply, fill #0
  Filled 2023-07-28: qty 90, 90d supply, fill #1
  Filled 2023-12-27: qty 90, 90d supply, fill #2

## 2023-04-25 NOTE — Assessment & Plan Note (Signed)
With acute exacerbation. Rx prednisone burst x5 days. Refill of advair, singulair, albuterol sent. F/u next week if no better.

## 2023-04-25 NOTE — Progress Notes (Signed)
    SUBJECTIVE:   CHIEF COMPLAINT / HPI:   SHORTNESS OF BREATH - h/o asthma, COVID long-hauler. On albuterol prn, advair, singulair, ran out Friday. - typically gets exacerbation when seasons change. - used albuterol neb every night since last week. Not helping.  - no known sick contacts. Works in cath lab at hospital. - not much cough but having chest tightness, SOB, wheezing - denies nausea, chest pain, fevers, palpitations   OBJECTIVE:   BP 118/78   Pulse 77   Ht 5\' 2"  (1.575 m)   Wt 215 lb 6.4 oz (97.7 kg)   LMP 03/24/2023   SpO2 99%   BMI 39.40 kg/m   Gen: tired appearing, in NAD Card: RRR Lungs: inspiratory and expiratory wheezing throughout with prolonged expiratory phase. Comfortable WOB on RA, appropriately saturated. Speaks in full sentences. Ext: WWP, no edema   ASSESSMENT/PLAN:   Asthma, mild persistent With acute exacerbation. Rx prednisone burst x5 days. Refill of advair, singulair, albuterol sent. F/u next week if no better.     Donna Laroche, DO

## 2023-04-25 NOTE — Patient Instructions (Addendum)
It was great to see you!  Our plans for today:  - We sent refills to your pharmacy. - Take the prednisone for 5 days. If you are not feeling better by next week or if you are feeling worse, come back to see [place].  Take care and seek immediate care sooner if you develop any concerns.   Dr. Ronald Pippins are due for health maintenance items. Make an appointment soon to get updated. Health Maintenance Due  Topic Date Due   Hepatitis C Screening  Never done   DTaP/Tdap/Td (2 - Tdap) 11/19/2008   Colonoscopy  Never done   INFLUENZA VACCINE  03/22/2023   COVID-19 Vaccine (3 - 2023-24 season) 04/22/2023

## 2023-05-10 DIAGNOSIS — F4323 Adjustment disorder with mixed anxiety and depressed mood: Secondary | ICD-10-CM | POA: Diagnosis not present

## 2023-05-24 DIAGNOSIS — F4323 Adjustment disorder with mixed anxiety and depressed mood: Secondary | ICD-10-CM | POA: Diagnosis not present

## 2023-07-28 ENCOUNTER — Other Ambulatory Visit (HOSPITAL_COMMUNITY): Payer: Self-pay

## 2023-07-28 ENCOUNTER — Other Ambulatory Visit: Payer: Self-pay | Admitting: Student

## 2023-07-28 DIAGNOSIS — J45909 Unspecified asthma, uncomplicated: Secondary | ICD-10-CM

## 2023-07-30 ENCOUNTER — Other Ambulatory Visit (HOSPITAL_COMMUNITY): Payer: Self-pay

## 2023-07-30 MED ORDER — ALBUTEROL SULFATE HFA 108 (90 BASE) MCG/ACT IN AERS
2.0000 | INHALATION_SPRAY | RESPIRATORY_TRACT | 0 refills | Status: DC | PRN
  Filled 2023-07-30: qty 6.7, 25d supply, fill #0

## 2023-08-02 ENCOUNTER — Ambulatory Visit: Payer: Commercial Managed Care - PPO | Admitting: Student

## 2023-08-02 ENCOUNTER — Other Ambulatory Visit: Payer: Self-pay | Admitting: Student

## 2023-08-02 ENCOUNTER — Other Ambulatory Visit (HOSPITAL_COMMUNITY): Payer: Self-pay

## 2023-08-02 ENCOUNTER — Encounter: Payer: Self-pay | Admitting: Student

## 2023-08-02 VITALS — BP 100/80 | HR 79 | Ht 62.0 in | Wt 215.8 lb

## 2023-08-02 DIAGNOSIS — Z6841 Body Mass Index (BMI) 40.0 and over, adult: Secondary | ICD-10-CM | POA: Diagnosis not present

## 2023-08-02 DIAGNOSIS — E66813 Obesity, class 3: Secondary | ICD-10-CM | POA: Diagnosis not present

## 2023-08-02 DIAGNOSIS — J4531 Mild persistent asthma with (acute) exacerbation: Secondary | ICD-10-CM | POA: Diagnosis not present

## 2023-08-02 DIAGNOSIS — J45909 Unspecified asthma, uncomplicated: Secondary | ICD-10-CM | POA: Diagnosis not present

## 2023-08-02 MED ORDER — BUDESONIDE-FORMOTEROL FUMARATE 80-4.5 MCG/ACT IN AERO: 2.0000 | INHALATION_SPRAY | Freq: Two times a day (BID) | RESPIRATORY_TRACT | 3 refills | Status: DC

## 2023-08-02 MED ORDER — BUDESONIDE-FORMOTEROL FUMARATE 160-4.5 MCG/ACT IN AERO
2.0000 | INHALATION_SPRAY | Freq: Two times a day (BID) | RESPIRATORY_TRACT | 3 refills | Status: DC
  Filled 2023-08-02: qty 10.2, 30d supply, fill #0
  Filled 2023-12-27: qty 10.2, 30d supply, fill #1
  Filled 2024-02-25: qty 10.2, 30d supply, fill #2
  Filled 2024-05-16: qty 10.2, 30d supply, fill #3

## 2023-08-02 MED ORDER — ALBUTEROL SULFATE HFA 108 (90 BASE) MCG/ACT IN AERS
2.0000 | INHALATION_SPRAY | RESPIRATORY_TRACT | 0 refills | Status: DC | PRN
  Filled 2023-08-02: qty 6.7, 25d supply, fill #0
  Filled 2023-08-08 (×2): qty 18, 30d supply, fill #0

## 2023-08-02 MED ORDER — ALBUTEROL SULFATE HFA 108 (90 BASE) MCG/ACT IN AERS
2.0000 | INHALATION_SPRAY | RESPIRATORY_TRACT | 0 refills | Status: AC | PRN
Start: 2023-08-02 — End: ?

## 2023-08-02 MED ORDER — PHENTERMINE HCL 15 MG PO CAPS
15.0000 mg | ORAL_CAPSULE | ORAL | 0 refills | Status: DC
  Filled 2023-08-02: qty 14, 14d supply, fill #0

## 2023-08-02 MED ORDER — PHENTERMINE HCL 15 MG PO CAPS: 15.0000 mg | ORAL_CAPSULE | ORAL | 0 refills | Status: DC

## 2023-08-02 NOTE — Progress Notes (Signed)
    SUBJECTIVE:   CHIEF COMPLAINT / HPI: Shortness of Breath  Presents with worsening shortness of breath over the past few months, which she attributes to recent weight gain. She reports increased frequency of shortness of breath, particularly when walking quickly outside or talking on the phone. She denies orthopnea and significant leg swelling. She estimates she can walk a distance equivalent to a round trip from the clinic to the hospital before experiencing shortness of breath. She is currently using albuterol Ventolin inhaler three times a day, Singulair daily, and a nebulizer as needed. She also takes a daily inhaler, previously Advair, but now a generic brand. She reports needing to use her albuterol inhaler more frequently, particularly before and after work. She has not seen a specialist for her breathing issues. No recent PFTs.   In addition to her respiratory concerns, the patient is also seeking assistance with weight loss. She reports a stable weight around 215-225 lbs and expresses a goal weight of 180 lbs. She has previously tried [place] for weight loss, but these were discontinued due to insurance coverage issues. She expresses interest in trying phentermine for weight loss, but is also open to a referral to a healthy weight and wellness program.  PERTINENT  PMH / PSH: asthma, obesity  OBJECTIVE:   BP 100/80   Pulse 79   Ht 5\' 2"  (1.575 m)   Wt 215 lb 12.8 oz (97.9 kg)   LMP 07/27/2023   SpO2 100%   BMI 39.47 kg/m   General: Well appearing, NAD, awake, alert, responsive to questions Head: Normocephalic atraumatic CV: Regular rate and rhythm no murmurs rubs or gallops Respiratory: Clear to ausculation bilaterally, no wheezes rales or crackles, chest rises symmetrically,  no increased work of breathing Abdomen: Soft, non-tender, non-distended, normoactive bowel sounds  Extremities: Moves upper and lower extremities freely, no edema in LE  ASSESSMENT/PLAN:    Assessment & Plan Class 3 severe obesity without serious comorbidity with body mass index (BMI) of 40.0 to 44.9 in adult, unspecified obesity type (HCC) BMI 39.47, has tried diet and exercise with not much weight loss.  She would be a good candidate for GLP-1 however insurance does not cover this.  Blood pressure within normal limits, we discussed phentermine which she was okay with with trialing for short period of time.  She was also good with healthy weight and wellness referral. -Refer to [facility] for comprehensive weight management program. -Start Phentermine (cancelled one sent to [facility]) for short-term use (3 months) to start weight loss. -Schedule nurse visit in 2 weeks to monitor blood pressure while on Phentermine. Persistent asthma without complication, unspecified asthma severity Increased shortness of breath, particularly with exertion and cold weather exposure. Currently on Albuterol, Singulair, and Advair. No recent pulmonary function tests (PFTs). -Order PFTs with Dr. Romona Curls. -Switch from Advair to Symbicort for potential increased efficacy. -Continue Albuterol and Singulair. -Refill Ventolin prescription to [facility].    FMLA paperwork-Patient requests intermittent leave for asthma exacerbations and medical appointments. Will complete when available.  Levin Erp, MD [facility]

## 2023-08-02 NOTE — Patient Instructions (Signed)
It was great to see you! Thank you for allowing me to participate in your care!   Our plans for today:  - I recommend repeat PFTs for lung function testing and switching to symbicort instead of advair - I can start phentermine and have you come back in 2 weeks for a blood pressure recheck and send you to healthy weight and wellness - I will keep an eye out on FMLA!  Take care and seek immediate care sooner if you develop any concerns.  Levin Erp, MD

## 2023-08-02 NOTE — Assessment & Plan Note (Signed)
BMI 39.47, has tried diet and exercise with not much weight loss.  She would be a good candidate for GLP-1 however insurance does not cover this.  Blood pressure within normal limits, we discussed phentermine which she was okay with with trialing for short period of time.  She was also good with healthy weight and wellness referral. -Refer to [facility] for comprehensive weight management program. -Start Phentermine (cancelled one sent to [facility]) for short-term use (3 months) to start weight loss. -Schedule nurse visit in 2 weeks to monitor blood pressure while on Phentermine.

## 2023-08-03 DIAGNOSIS — F4323 Adjustment disorder with mixed anxiety and depressed mood: Secondary | ICD-10-CM | POA: Diagnosis not present

## 2023-08-08 ENCOUNTER — Other Ambulatory Visit (HOSPITAL_COMMUNITY): Payer: Self-pay

## 2023-08-09 ENCOUNTER — Other Ambulatory Visit (HOSPITAL_COMMUNITY): Payer: Self-pay

## 2023-08-14 ENCOUNTER — Other Ambulatory Visit (HOSPITAL_COMMUNITY): Payer: Self-pay

## 2023-08-16 ENCOUNTER — Ambulatory Visit: Payer: Commercial Managed Care - PPO

## 2023-08-16 VITALS — BP 110/70 | HR 82

## 2023-08-16 DIAGNOSIS — Z0130 Encounter for examination of blood pressure without abnormal findings: Secondary | ICD-10-CM

## 2023-08-16 NOTE — Progress Notes (Signed)
Patient presents to nurse clinic for BP check.  She reports she was started on Phentermine and was told to followup in 2 weeks for BP check.   BP today: 110/70  Patient denies any concerns and reports she feels great. She denies any adverse reaction to the medication. She is interested in going up to the next dose if possible. She reports compliance with 15mg  capsule each morning since 12/12  Advised will forward to PCP.

## 2023-08-17 ENCOUNTER — Other Ambulatory Visit: Payer: Self-pay | Admitting: Student

## 2023-08-17 ENCOUNTER — Other Ambulatory Visit (HOSPITAL_COMMUNITY): Payer: Self-pay

## 2023-08-17 ENCOUNTER — Encounter: Payer: Self-pay | Admitting: Student

## 2023-08-17 MED ORDER — PHENTERMINE HCL 30 MG PO CAPS
30.0000 mg | ORAL_CAPSULE | ORAL | 0 refills | Status: DC
  Filled 2023-08-17: qty 30, 30d supply, fill #0

## 2023-09-06 ENCOUNTER — Ambulatory Visit: Payer: Commercial Managed Care - PPO | Admitting: Pharmacist

## 2023-09-20 ENCOUNTER — Other Ambulatory Visit: Payer: Self-pay | Admitting: Student

## 2023-09-21 ENCOUNTER — Other Ambulatory Visit (HOSPITAL_COMMUNITY): Payer: Self-pay

## 2023-09-21 MED ORDER — PHENTERMINE HCL 30 MG PO CAPS
30.0000 mg | ORAL_CAPSULE | ORAL | 0 refills | Status: DC
  Filled 2023-09-21: qty 30, 30d supply, fill #0

## 2023-10-01 ENCOUNTER — Telehealth: Payer: Self-pay | Admitting: Pharmacist

## 2023-10-01 NOTE — Telephone Encounter (Signed)
 Attempted to contact patient for follow-up of PFT procedure. Patient did not answer the phone call, and left a voicemail message.    Left HIPAA compliant voice mail requesting call back to direct phone: [phone]  Total time with patient call and documentation of interaction: 3 minutes.

## 2023-10-09 ENCOUNTER — Telehealth: Payer: Commercial Managed Care - PPO | Admitting: Physician Assistant

## 2023-10-09 ENCOUNTER — Other Ambulatory Visit (HOSPITAL_COMMUNITY): Payer: Self-pay

## 2023-10-09 DIAGNOSIS — R112 Nausea with vomiting, unspecified: Secondary | ICD-10-CM

## 2023-10-09 MED ORDER — ONDANSETRON 4 MG PO TBDP
4.0000 mg | ORAL_TABLET | Freq: Three times a day (TID) | ORAL | 0 refills | Status: DC | PRN
  Filled 2023-10-09: qty 20, 7d supply, fill #0

## 2023-10-09 NOTE — Progress Notes (Signed)
 E-Visit for Vomiting  We are sorry that you are not feeling well. Here is how we plan to help!   Vomiting is the forceful emptying of a portion of the stomach's content through the mouth.  Although nausea and vomiting can make you feel miserable, it's important to remember that these are not diseases, but rather symptoms of an underlying illness.  When we treat short term symptoms, we always caution that any symptoms that persist should be fully evaluated in a medical office.  I have prescribed a medication that will help alleviate your symptoms and allow you to stay hydrated:  Zofran 4 mg 1 tablet every 8 hours as needed for nausea and vomiting  HOME CARE: Drink clear liquids.  This is very important! Dehydration (the lack of fluid) can lead to a serious complication.  Start off with 1 tablespoon every 5 minutes for 8 hours. You may begin eating bland foods after 8 hours without vomiting.  Start with saltine crackers, white bread, rice, mashed potatoes, applesauce. After 48 hours on a bland diet, you may resume a normal diet. Try to go to sleep.  Sleep often empties the stomach and relieves the need to vomit.  GET HELP RIGHT AWAY IF:  Your symptoms do not improve or worsen within 2 days after treatment. You have a fever for over 3 days. You cannot keep down fluids after trying the medication.  MAKE SURE YOU:  Understand these instructions. Will watch your condition. Will get help right away if you are not doing well or get worse.   Thank you for choosing an e-visit.  Your e-visit answers were reviewed by a board certified advanced clinical practitioner to complete your personal care plan. Depending upon the condition, your plan could have included both over the counter or prescription medications.  Please review your pharmacy choice. Make sure the pharmacy is open so you can pick up prescription now. If there is a problem, you may contact your provider through [employer] and  have the prescription routed to another pharmacy.  Your safety is important to [place]. If you have drug allergies check your prescription carefully.   For the next 24 hours you can use MyChart to ask questions about today's visit, request a non-urgent call back, or ask for a work or school excuse. You will get an email in the next two days asking about your experience. I hope that your e-visit has been valuable and will speed your recovery.

## 2023-10-09 NOTE — Progress Notes (Signed)
 I have spent 5 minutes in review of e-visit questionnaire, review and updating patient chart, medical decision making and response to patient.   Piedad Climes, PA-C

## 2023-10-15 ENCOUNTER — Encounter: Payer: Self-pay | Admitting: Emergency Medicine

## 2023-10-15 ENCOUNTER — Ambulatory Visit
Admission: EM | Admit: 2023-10-15 | Discharge: 2023-10-15 | Disposition: A | Discharge status: Home / Self Care | Payer: Commercial Managed Care - PPO | Attending: Internal Medicine | Admitting: Internal Medicine

## 2023-10-15 ENCOUNTER — Other Ambulatory Visit (HOSPITAL_COMMUNITY): Payer: Self-pay

## 2023-10-15 ENCOUNTER — Ambulatory Visit: Payer: Self-pay

## 2023-10-15 ENCOUNTER — Ambulatory Visit (INDEPENDENT_AMBULATORY_CARE_PROVIDER_SITE_OTHER): Payer: Commercial Managed Care - PPO | Admitting: Radiology

## 2023-10-15 DIAGNOSIS — J0140 Acute pansinusitis, unspecified: Secondary | ICD-10-CM | POA: Diagnosis not present

## 2023-10-15 DIAGNOSIS — R051 Acute cough: Secondary | ICD-10-CM | POA: Diagnosis not present

## 2023-10-15 DIAGNOSIS — J4521 Mild intermittent asthma with (acute) exacerbation: Secondary | ICD-10-CM

## 2023-10-15 MED ORDER — PREDNISONE 20 MG PO TABS
40.0000 mg | ORAL_TABLET | Freq: Every day | ORAL | 0 refills | Status: AC
  Filled 2023-10-15: qty 10, 5d supply, fill #0

## 2023-10-15 MED ORDER — DEXAMETHASONE SODIUM PHOSPHATE 10 MG/ML IJ SOLN
10.0000 mg | Freq: Once | INTRAMUSCULAR | Status: AC
  Administered 2023-10-15: 10 mg via INTRAMUSCULAR

## 2023-10-15 MED ORDER — PROMETHAZINE-DM 6.25-15 MG/5ML PO SYRP
5.0000 mL | ORAL_SOLUTION | Freq: Every evening | ORAL | 0 refills | Status: DC | PRN
  Filled 2023-10-15: qty 118, 23d supply, fill #0

## 2023-10-15 MED ORDER — AMOXICILLIN-POT CLAVULANATE 875-125 MG PO TABS
1.0000 | ORAL_TABLET | Freq: Two times a day (BID) | ORAL | 0 refills | Status: DC
  Filled 2023-10-15: qty 14, 7d supply, fill #0

## 2023-10-15 NOTE — ED Provider Notes (Signed)
 [facility]    CSN: [id number] Arrival date & time: 10/15/23  1251      History   Chief Complaint No chief complaint on file.   HPI Donna Carlson is a 48 y.o. female.   Patient presents to urgent care for evaluation of cough, nasal congestion, and generalized fatigue that started approximately 1 week ago.  Cough is productive with yellow sputum, nasal congestion mucus is also yellow and green.  She has not had any fevers or chills.  Reports intermittent nausea without vomiting or diarrhea.  Multiple recent sick contacts at her job with similar symptoms.  Never smoker.  History of asthma, denies recent antibiotic or steroid use.  Asthma is typically well-controlled with as needed use of albuterol inhaler.  She used her inhaler approximately 1 to 2 hours ago for chest tightness and shortness of breath associated with coughing and states this helped a little bit.  Taking Tylenol with some relief.     Past Medical History:  Diagnosis Date   Asthma    COVID-19 virus infection 12/10/2019   Palpitations    Tinea pedis 01/27/2019   Toe injury, left, initial encounter 01/27/2019   UTI symptoms 04/01/2020    Patient Active Problem List   Diagnosis Date Noted   Pica 12/07/2022   New onset headache 10/06/2021   COVID-19 long hauler 07/25/2021   GAD (generalized anxiety disorder) 02/16/2021   Stress and adjustment reaction 02/14/2020   Allergic rhinitis 06/04/2015   Lower abdominal pain 08/29/2013   Vaginal discharge 08/10/2011   Obesity 01/02/2008   Asthma, mild persistent 10/18/2006    Past Surgical History:  Procedure Laterality Date   GALLBLADDER SURGERY     TUBAL LIGATION      OB History   No obstetric history on file.      Home Medications    Prior to Admission medications   Medication Sig Start Date End Date Taking? Authorizing Provider  amoxicillin-clavulanate (AUGMENTIN) 875-125 MG tablet Take 1 tablet by mouth every 12 (twelve) hours. 10/15/23  Yes  Carlisle Beers, FNP  predniSONE (DELTASONE) 20 MG tablet Take 2 tablets (40 mg total) by mouth daily with breakfast for 5 days. 10/15/23 10/20/23 Yes Carlisle Beers, FNP  promethazine-dextromethorphan (PROMETHAZINE-DM) 6.25-15 MG/5ML syrup Take 5 mLs by mouth at bedtime as needed for cough. 10/15/23  Yes Carlisle Beers, FNP  albuterol (PROVENTIL) (2.5 MG/3ML) 0.083% nebulizer solution Take 3 mLs (2.5 mg total) by nebulization every 4 (four) hours as needed for wheezing or shortness of breath. 04/25/23   Caro Laroche, DO  albuterol (VENTOLIN HFA) 108 (90 Base) MCG/ACT inhaler Inhale 2 puffs into the lungs every 4 (four) hours as needed for wheezing or shortness of breath. 08/02/23   Levin Erp, MD  albuterol (VENTOLIN HFA) 108 (90 Base) MCG/ACT inhaler Inhale 2 puffs into the lungs every 4 (four) hours as needed for wheezing or shortness of breath. 08/02/23   Levin Erp, MD  budesonide-formoterol (SYMBICORT) 160-4.5 MCG/ACT inhaler Inhale 2 puffs into the lungs 2 (two) times daily. 08/02/23   Levin Erp, MD  ferrous sulfate 324 MG TBEC Take 324 mg by mouth every other day.    [provider]  montelukast (SINGULAIR) 10 MG tablet Take 1 tablet (10 mg total) by mouth at bedtime. 04/25/23   Caro Laroche, DO  ondansetron (ZOFRAN-ODT) 4 MG disintegrating tablet Take 1 tablet (4 mg total) by mouth every 8 (eight) hours as needed for nausea or vomiting. Patient  not taking: Reported on 10/15/2023 10/09/23   Waldon Merl, PA-C  phentermine 30 MG capsule Take 1 capsule (30 mg total) by mouth every morning. 09/21/23   Levin Erp, MD  polyethylene glycol powder (GLYCOLAX/MIRALAX) 17 GM/SCOOP powder Take 17 g by mouth daily. 04/27/22   Maury Dus, MD  Semaglutide-Weight Management (WEGOVY) 1.7 MG/0.75ML SOAJ Inject 1.7 mg into the skin once a week. Patient not taking: Reported on 10/15/2023 09/29/22   Levin Erp, MD  senna (SENOKOT) 8.6 MG TABS tablet  Take 1 tablet (8.6 mg total) by mouth daily. 04/27/22   Maury Dus, MD    Family History Family History  Problem Relation Age of Onset   Hypertension Mother    Heart attack Paternal Grandfather    Hypertension Maternal Grandmother     Social History Social History   Tobacco Use   Smoking status: Never   Smokeless tobacco: Never  Substance Use Topics   Alcohol use: Yes    Comment: occasional   Drug use: No     Allergies   Shellfish allergy, Dilaudid [hydromorphone hcl], and Iodine   Review of Systems Review of Systems Per HPI  Physical Exam Triage Vital Signs ED Triage Vitals  Encounter Vitals Group     BP 10/15/23 1435 126/86     Systolic BP Percentile --      Diastolic BP Percentile --      Pulse Rate 10/15/23 1435 92     Resp 10/15/23 1435 20     Temp 10/15/23 1435 98.3 F (36.8 C)     Temp Source 10/15/23 1435 Oral     SpO2 10/15/23 1435 98 %     Weight --      Height --      Head Circumference --      Peak Flow --      Pain Score 10/15/23 1508 0     Pain Loc --      Pain Education --      Exclude from Growth Chart --    No data found.  Updated Vital Signs BP 126/86 (BP Location: Right Arm)   Pulse 92   Temp 98.3 F (36.8 C) (Oral)   Resp 20   LMP 09/27/2023 (Approximate)   SpO2 98%   Visual Acuity Right Eye Distance:   Left Eye Distance:   Bilateral Distance:    Right Eye Near:   Left Eye Near:    Bilateral Near:     Physical Exam Vitals and nursing note reviewed.  Constitutional:      Appearance: She is ill-appearing. She is not toxic-appearing.  HENT:     Head: Normocephalic and atraumatic.     Right Ear: Hearing, tympanic membrane, ear canal and external ear normal.     Left Ear: Hearing, tympanic membrane, ear canal and external ear normal.     Nose: Congestion present.     Mouth/Throat:     Lips: Pink.     Mouth: Mucous membranes are moist. No injury or oral lesions.     Dentition: Normal dentition.     Tongue: No  lesions.     Pharynx: Oropharynx is clear. Uvula midline. No pharyngeal swelling, oropharyngeal exudate, posterior oropharyngeal erythema, uvula swelling or postnasal drip.     Tonsils: No tonsillar exudate.  Eyes:     General: Lids are normal. Vision grossly intact. Gaze aligned appropriately.     Extraocular Movements: Extraocular movements intact.     Conjunctiva/sclera: Conjunctivae normal.  Neck:  Trachea: Trachea and phonation normal.  Cardiovascular:     Rate and Rhythm: Normal rate and regular rhythm.     Heart sounds: Normal heart sounds, S1 normal and S2 normal.  Pulmonary:     Effort: Pulmonary effort is normal. No respiratory distress.     Breath sounds: Normal breath sounds and air entry. No wheezing, rhonchi or rales.  Chest:     Chest wall: No tenderness.  Musculoskeletal:     Cervical back: Neck supple.  Lymphadenopathy:     Cervical: Cervical adenopathy present.  Skin:    General: Skin is warm and dry.     Capillary Refill: Capillary refill takes less than 2 seconds.     Findings: No rash.  Neurological:     General: No focal deficit present.     Mental Status: She is alert and oriented to person, place, and time. Mental status is at baseline.     Cranial Nerves: No dysarthria or facial asymmetry.  Psychiatric:        Mood and Affect: Mood normal.        Speech: Speech normal.        Behavior: Behavior normal.        Thought Content: Thought content normal.        Judgment: Judgment normal.      UC Treatments / Results  Labs (all labs ordered are listed, but only abnormal results are displayed) Labs Reviewed - No data to display  EKG   Radiology No results found.  Procedures Procedures (including critical care time)  Medications Ordered in UC Medications  dexamethasone (DECADRON) injection 10 mg (has no administration in time range)    Initial Impression / Assessment and Plan / UC Course  I have reviewed the triage vital signs and the  nursing notes.  Pertinent labs & imaging results that were available during my care of the patient were reviewed by me and considered in my medical decision making (see chart for details).   1.  Acute cough, mild intermittent asthma with acute exacerbation Presentation consistent with acute asthma exacerbation. Lungs clear.  Chest x-ray shows peribronchial thickening asthma exacerbation without appreciable focal abnormality/pneumonia.  No pneumothorax or pleural effusion.  Radiology reread is pending at time of discharge, staff will call if radiology reread shows abnormality indicating need for change in treatment plan.  Will treat with dexamethasone 10 mg IM today, then prednisone burst to be started tomorrow. Patient declines refill of albuterol inhaler stating she has plenty at home. May continue using albuterol as needed for shortness of breath and wheezing. Recommend supportive care for symptomatic relief as outlined in AVS.   If sinusitis symptoms do not improve in the next 24 to 48 hours, she may start Augmentin antibiotic twice daily for 7 days to treat sinus infection.  Counseled patient on potential for adverse effects with medications prescribed/recommended today, strict ER and return-to-clinic precautions discussed, patient verbalized understanding.    Final Clinical Impressions(s) / UC Diagnoses   Final diagnoses:  Acute cough  Mild intermittent asthma with (acute) exacerbation  Acute non-recurrent pansinusitis     Discharge Instructions      Your symptoms are due to asthma attack and sinus infection.  - Take antibiotic as prescribed. - Use albuterol every 4-6 hours as needed for cough, shortness of breath, and wheeze. - Take the steroid sent to the pharmacy as directed to help reduce lung inflammation and decrease the risk of another attack in the next few days. No  NSAIDs like ibuprofen or aleve/naproxen while taking steroid. Take with food to avoid stomach  upset. Take steroid starting tomorrow - Take prescribed cough medicine as directed.  If your symptoms do not improve in the next 2-3 days with interventions, please return. Please seek medical care for new or returning symptoms, such as difficulty breathing that doesn't improve with your medications, chest pain, voice changes, high fevers, confusion, or other new or worsening symptoms. Follow-up with PCP for ongoing management of asthma.      ED Prescriptions     Medication Sig Dispense Auth. Provider   amoxicillin-clavulanate (AUGMENTIN) 875-125 MG tablet Take 1 tablet by mouth every 12 (twelve) hours. 14 tablet Reita May M, FNP   predniSONE (DELTASONE) 20 MG tablet Take 2 tablets (40 mg total) by mouth daily with breakfast for 5 days. 10 tablet Carlisle Beers, FNP   promethazine-dextromethorphan (PROMETHAZINE-DM) 6.25-15 MG/5ML syrup Take 5 mLs by mouth at bedtime as needed for cough. 118 mL Carlisle Beers, FNP      PDMP not reviewed this encounter.   Carlisle Beers, [place] 10/15/23 1547

## 2023-10-15 NOTE — Discharge Instructions (Signed)
 Your symptoms are due to asthma attack and sinus infection.  - Take antibiotic as prescribed. - Use albuterol every 4-6 hours as needed for cough, shortness of breath, and wheeze. - Take the steroid sent to the pharmacy as directed to help reduce lung inflammation and decrease the risk of another attack in the next few days. No NSAIDs like ibuprofen or aleve/naproxen while taking steroid. Take with food to avoid stomach upset. Take steroid starting tomorrow - Take prescribed cough medicine as directed.  If your symptoms do not improve in the next 2-3 days with interventions, please return. Please seek medical care for new or returning symptoms, such as difficulty breathing that doesn't improve with your medications, chest pain, voice changes, high fevers, confusion, or other new or worsening symptoms. Follow-up with PCP for ongoing management of asthma.

## 2023-10-15 NOTE — ED Triage Notes (Signed)
 Pt states last Tuesday she had reflux symptoms and was given medication via e visit. On Thursday she began to have congestion, cough, and sob

## 2023-10-29 ENCOUNTER — Other Ambulatory Visit: Payer: Self-pay | Admitting: Student

## 2023-10-29 MED ORDER — PHENTERMINE HCL 30 MG PO CAPS
30.0000 mg | ORAL_CAPSULE | ORAL | 0 refills | Status: DC
Start: 2023-10-29 — End: 2023-12-20
  Filled 2023-10-29: qty 30, 30d supply, fill #0

## 2023-10-30 ENCOUNTER — Other Ambulatory Visit (HOSPITAL_COMMUNITY): Payer: Self-pay

## 2023-11-07 DIAGNOSIS — F4323 Adjustment disorder with mixed anxiety and depressed mood: Secondary | ICD-10-CM | POA: Diagnosis not present

## 2023-12-17 ENCOUNTER — Other Ambulatory Visit: Payer: Self-pay | Admitting: Student

## 2023-12-19 ENCOUNTER — Other Ambulatory Visit (HOSPITAL_COMMUNITY): Payer: Self-pay

## 2023-12-19 ENCOUNTER — Encounter (HOSPITAL_COMMUNITY): Payer: Self-pay

## 2023-12-19 ENCOUNTER — Encounter: Payer: Self-pay | Admitting: Student

## 2023-12-19 DIAGNOSIS — E66813 Obesity, class 3: Secondary | ICD-10-CM

## 2023-12-20 MED ORDER — PHENTERMINE HCL 30 MG PO CAPS: 30.0000 mg | ORAL_CAPSULE | ORAL | 0 refills | Status: DC

## 2023-12-27 ENCOUNTER — Other Ambulatory Visit: Payer: Self-pay | Admitting: Student

## 2023-12-27 DIAGNOSIS — Z6841 Body Mass Index (BMI) 40.0 and over, adult: Secondary | ICD-10-CM

## 2023-12-28 ENCOUNTER — Other Ambulatory Visit (HOSPITAL_COMMUNITY): Payer: Self-pay

## 2023-12-28 MED ORDER — PHENTERMINE HCL 30 MG PO CAPS
30.0000 mg | ORAL_CAPSULE | ORAL | 0 refills | Status: DC
  Filled 2023-12-28: qty 15, 15d supply, fill #0

## 2024-01-29 ENCOUNTER — Encounter: Payer: Self-pay | Admitting: *Deleted

## 2024-01-31 ENCOUNTER — Other Ambulatory Visit: Payer: Self-pay | Admitting: Student

## 2024-01-31 ENCOUNTER — Other Ambulatory Visit (HOSPITAL_COMMUNITY): Payer: Self-pay

## 2024-01-31 DIAGNOSIS — J45909 Unspecified asthma, uncomplicated: Secondary | ICD-10-CM

## 2024-01-31 MED ORDER — ALBUTEROL SULFATE HFA 108 (90 BASE) MCG/ACT IN AERS
2.0000 | INHALATION_SPRAY | RESPIRATORY_TRACT | 0 refills | Status: DC | PRN
  Filled 2024-01-31: qty 18, 17d supply, fill #0

## 2024-02-01 ENCOUNTER — Other Ambulatory Visit (HOSPITAL_COMMUNITY): Payer: Self-pay

## 2024-02-04 NOTE — Progress Notes (Unsigned)
    SUBJECTIVE:   CHIEF COMPLAINT / HPI:   Discussed the use of AI scribe software for clinical note transcription with the patient, who gave verbal consent to proceed.  History of Present Illness Donna Carlson is a 48 year old female with iron deficiency anemia who presents with dizziness and fatigue.  Last week, she experienced dizziness and fatigue for three to four hours after returning home from work yesterday. She has a history of low iron levels and discontinued ferrous sulfate due to constipation and stomach pain. She recently started a new iron supplement (multivitamin with 20 mg iron) without adverse effects. Her menstrual cycles are heavy, especially during the first two days, and her last period began on the third of the month. An ultrasound in 2011 was normal but states at one point she was told about fibroid. She is currently taking phentermine  30 mg, which has aided in appetite control and weight loss, from 215 in December to 202 today.  Has not gotten a colonoscopy before.  Anemia profile in April with hemoglobin of 10.7 and iron saturation of 12 and ferritin of 15  PERTINENT  PMH / PSH: Obesity, GAD  OBJECTIVE:   BP 121/88   Pulse 80   Ht 5' 2 (1.575 m)   Wt 202 lb 4 oz (91.7 kg)   SpO2 100%   BMI 36.99 kg/m   General: Well appearing, NAD, awake, alert, responsive to questions Head: Normocephalic atraumatic Respiratory: chest rises symmetrically,  no increased work of breathing Extremities: Moves upper and lower extremities freely ASSESSMENT/PLAN:   Assessment & Plan Iron deficiency anemia due to chronic blood loss Low iron levels likely due to heavy menstrual bleeding. Prefers oral ferric gluconate over IV iron infusion.  - Order complete blood count and iron studies, TSH - Prescribe ferric gluconate, consider IV iron based on levels - Refer to gastroenterology for colonoscopy - Order pelvic ultrasound - Discussed endometrial biopsy at colpo clinic or  OBGYN after imaging results Class 2 obesity On phentermine  30 mg with 13 pounds weight loss. Does feel appetite suprpession Discussed typical efficacy beyond three months.  - Continue phentermine  30 mg - Monitor weight and appetite - 1 month follow up for weight trend - if stable or increasing can consider trial off of medication and monitoring weight versus continuing for maintenance    Genora Kidd, MD [facility]

## 2024-02-05 ENCOUNTER — Other Ambulatory Visit (HOSPITAL_COMMUNITY): Payer: Self-pay

## 2024-02-05 ENCOUNTER — Encounter: Payer: Self-pay | Admitting: Student

## 2024-02-05 ENCOUNTER — Other Ambulatory Visit: Payer: Self-pay

## 2024-02-05 ENCOUNTER — Ambulatory Visit: Admitting: Student

## 2024-02-05 VITALS — BP 121/88 | HR 80 | Ht 62.0 in | Wt 202.2 lb

## 2024-02-05 DIAGNOSIS — D500 Iron deficiency anemia secondary to blood loss (chronic): Secondary | ICD-10-CM

## 2024-02-05 DIAGNOSIS — E66812 Obesity, class 2: Secondary | ICD-10-CM

## 2024-02-05 MED ORDER — FERROUS GLUCONATE 324 (37.5 FE) MG PO TABS: 324.0000 mg | ORAL_TABLET | Freq: Two times a day (BID) | ORAL | 0 refills | Status: DC

## 2024-02-05 MED ORDER — PHENTERMINE HCL 30 MG PO CAPS: 30.0000 mg | ORAL_CAPSULE | ORAL | 0 refills | Status: DC

## 2024-02-05 MED ORDER — FERROUS GLUCONATE 324 (37.5 FE) MG PO TABS
324.0000 mg | ORAL_TABLET | Freq: Two times a day (BID) | ORAL | 0 refills | Status: DC
  Filled 2024-02-05 (×2): qty 100, 50d supply, fill #0

## 2024-02-05 NOTE — Addendum Note (Signed)
 Addended by: Dovie Kapusta on: 02/05/2024 10:19 AM   Modules accepted: Orders

## 2024-02-05 NOTE — Patient Instructions (Addendum)
 It was great to see you! Thank you for allowing me to participate in your care!   Our plans for today:  - For your iron deficiency-I have sent in ferrous gluconate for you to take instead of ferrous sulfate, we will recheck your iron panel and CBC today - I am also going to refer you to GI for a colonoscopy - I do recommend getting a repeat pelvic ultrasound and an endometrial biopsy given you have been having heavy menstrual periods to make sure there is nothing else going on that is causing you to have heavy periods(this would be a colposcopy clinic appointment or OBGYN appointment) - we can continue phentermine - I recommend 1 month follow up to see if weight is decreasing or stable  Take care and seek immediate care sooner if you develop any concerns.  Genora Kidd, MD

## 2024-02-05 NOTE — Assessment & Plan Note (Signed)
 On phentermine  30 mg with 13 pounds weight loss. Does feel appetite suprpession Discussed typical efficacy beyond three months.  - Continue phentermine  30 mg - Monitor weight and appetite - 1 month follow up for weight trend - if stable or increasing can consider trial off of medication and monitoring weight versus continuing for maintenance

## 2024-02-06 ENCOUNTER — Other Ambulatory Visit (HOSPITAL_COMMUNITY): Payer: Self-pay

## 2024-02-06 ENCOUNTER — Ambulatory Visit: Payer: Self-pay | Admitting: Student

## 2024-02-06 LAB — IRON,TIBC AND FERRITIN PANEL
Ferritin: 24 ng/mL (ref 15–150)
Iron Saturation: 7 % — CL (ref 15–55)
Iron: 31 ug/dL (ref 27–159)
Total Iron Binding Capacity: 447 ug/dL (ref 250–450)
UIBC: 416 ug/dL (ref 131–425)

## 2024-02-06 LAB — CBC
Hematocrit: 33.3 % — ABNORMAL LOW (ref 34.0–46.6)
Hemoglobin: 10.2 g/dL — ABNORMAL LOW (ref 11.1–15.9)
MCH: 24.2 pg — ABNORMAL LOW (ref 26.6–33.0)
MCHC: 30.6 g/dL — ABNORMAL LOW (ref 31.5–35.7)
MCV: 79 fL (ref 79–97)
Platelets: 406 10*3/uL (ref 150–450)
RBC: 4.21 x10E6/uL (ref 3.77–5.28)
RDW: 16.7 % — ABNORMAL HIGH (ref 11.7–15.4)
WBC: 4.9 10*3/uL (ref 3.4–10.8)

## 2024-02-06 LAB — TSH RFX ON ABNORMAL TO FREE T4: TSH: 2.4 u[IU]/mL (ref 0.450–4.500)

## 2024-02-07 ENCOUNTER — Other Ambulatory Visit: Payer: Self-pay | Admitting: Student

## 2024-02-07 ENCOUNTER — Telehealth (HOSPITAL_COMMUNITY): Payer: Self-pay | Admitting: Family Medicine

## 2024-02-07 ENCOUNTER — Telehealth: Payer: Self-pay

## 2024-02-07 ENCOUNTER — Other Ambulatory Visit (HOSPITAL_COMMUNITY): Payer: Self-pay | Admitting: Family Medicine

## 2024-02-07 ENCOUNTER — Other Ambulatory Visit (HOSPITAL_COMMUNITY): Payer: Self-pay

## 2024-02-07 DIAGNOSIS — D500 Iron deficiency anemia secondary to blood loss (chronic): Secondary | ICD-10-CM

## 2024-02-07 DIAGNOSIS — D509 Iron deficiency anemia, unspecified: Secondary | ICD-10-CM

## 2024-02-07 MED ORDER — PHENTERMINE HCL 30 MG PO CAPS
30.0000 mg | ORAL_CAPSULE | ORAL | 0 refills | Status: AC
Start: 2024-02-07 — End: ?
  Filled 2024-02-07: qty 30, 30d supply, fill #0

## 2024-02-07 NOTE — Telephone Encounter (Signed)
 Spoke with patient to inform of US  at [facility] For July 1st but she stated that she called them a rescheduled her appointment. Linnie Riches, CMA

## 2024-02-07 NOTE — Telephone Encounter (Signed)
Medication was set to print. Please resend. 

## 2024-02-07 NOTE — Telephone Encounter (Signed)
 Patient referred to infusion pharmacy team for ambulatory infusion of IV iron.  Insurance - [employer] of care - Site of care: [facility] Dx code - D50.0/D50.9  IV Iron Therapy - Venofer 300 mg x 3 Infusion appointments - Scheduling team will schedule patient as soon as possible.    Donna Carlson, PharmD

## 2024-02-07 NOTE — Addendum Note (Signed)
 Addended by: Luciana Ruth on: 02/07/2024 08:18 AM   Modules accepted: Orders

## 2024-02-07 NOTE — Progress Notes (Signed)
 Discussed treatment options with patient. I have ordered IV Iron Transfusion. I discussed the indications, risks, and benefits with the patient.  Genora Kidd, MD  [facility]

## 2024-02-08 ENCOUNTER — Other Ambulatory Visit (HOSPITAL_COMMUNITY): Payer: Self-pay

## 2024-02-15 ENCOUNTER — Telehealth: Payer: Self-pay | Admitting: Pharmacy Technician

## 2024-02-15 NOTE — Telephone Encounter (Signed)
 Auth Submission: NO AUTH NEEDED Site of care: Site of care: [facility] Payer: [employer] ([place] Medication & CPT/J Code(s) submitted: Venofer (Iron  Sucrose) J1756 Diagnosis Code:  Route of submission (phone, fax, portal): portal Phone # Fax # Auth type: Buy/Bill HB Units/visits requested: 300mg  x 3 doses  Reference number:  Approval from: 02/15/24 to 08/20/24

## 2024-02-19 ENCOUNTER — Ambulatory Visit (HOSPITAL_COMMUNITY)
Admission: RE | Admit: 2024-02-19 | Discharge: 2024-02-19 | Disposition: A | Discharge status: Home / Self Care | Source: Ambulatory Visit | Attending: Family Medicine | Admitting: Family Medicine

## 2024-02-19 DIAGNOSIS — N83292 Other ovarian cyst, left side: Secondary | ICD-10-CM | POA: Diagnosis not present

## 2024-02-19 DIAGNOSIS — N858 Other specified noninflammatory disorders of uterus: Secondary | ICD-10-CM | POA: Diagnosis not present

## 2024-02-19 DIAGNOSIS — N8302 Follicular cyst of left ovary: Secondary | ICD-10-CM | POA: Diagnosis not present

## 2024-02-19 DIAGNOSIS — D500 Iron deficiency anemia secondary to blood loss (chronic): Secondary | ICD-10-CM | POA: Insufficient documentation

## 2024-02-19 DIAGNOSIS — D259 Leiomyoma of uterus, unspecified: Secondary | ICD-10-CM | POA: Diagnosis not present

## 2024-02-20 ENCOUNTER — Encounter: Payer: Self-pay | Admitting: Family Medicine

## 2024-02-20 ENCOUNTER — Encounter (HOSPITAL_COMMUNITY)
Admission: RE | Admit: 2024-02-20 | Discharge: 2024-02-20 | Disposition: A | Discharge status: Home / Self Care | Source: Ambulatory Visit | Attending: Family Medicine | Admitting: Family Medicine

## 2024-02-20 DIAGNOSIS — D500 Iron deficiency anemia secondary to blood loss (chronic): Secondary | ICD-10-CM | POA: Diagnosis not present

## 2024-02-20 DIAGNOSIS — D509 Iron deficiency anemia, unspecified: Secondary | ICD-10-CM | POA: Diagnosis not present

## 2024-02-20 MED ORDER — IRON SUCROSE 300 MG IVPB - SIMPLE MED
300.0000 mg | Status: DC
  Administered 2024-02-20: 300 mg via INTRAVENOUS
  Filled 2024-02-20: qty 300

## 2024-02-21 ENCOUNTER — Other Ambulatory Visit: Payer: Self-pay | Admitting: Family Medicine

## 2024-02-21 DIAGNOSIS — D259 Leiomyoma of uterus, unspecified: Secondary | ICD-10-CM

## 2024-02-21 NOTE — Telephone Encounter (Signed)
 Called pt to discuss ultrasound results. Will send referral to OBGYN at this time. Questions answered.

## 2024-02-26 ENCOUNTER — Inpatient Hospital Stay (HOSPITAL_COMMUNITY)
Admission: RE | Admit: 2024-02-26 | Discharge: 2024-02-26 | Disposition: A | Discharge status: Home / Self Care | Source: Ambulatory Visit | Attending: Family Medicine

## 2024-02-26 DIAGNOSIS — D509 Iron deficiency anemia, unspecified: Secondary | ICD-10-CM | POA: Diagnosis not present

## 2024-02-26 DIAGNOSIS — Z862 Personal history of diseases of the blood and blood-forming organs and certain disorders involving the immune mechanism: Secondary | ICD-10-CM | POA: Diagnosis not present

## 2024-02-26 DIAGNOSIS — M545 Low back pain, unspecified: Secondary | ICD-10-CM | POA: Diagnosis not present

## 2024-02-26 DIAGNOSIS — D219 Benign neoplasm of connective and other soft tissue, unspecified: Secondary | ICD-10-CM | POA: Diagnosis not present

## 2024-02-26 DIAGNOSIS — Z01419 Encounter for gynecological examination (general) (routine) without abnormal findings: Secondary | ICD-10-CM | POA: Diagnosis not present

## 2024-02-26 DIAGNOSIS — G8929 Other chronic pain: Secondary | ICD-10-CM | POA: Diagnosis not present

## 2024-02-26 DIAGNOSIS — D500 Iron deficiency anemia secondary to blood loss (chronic): Secondary | ICD-10-CM

## 2024-02-26 DIAGNOSIS — Z309 Encounter for contraceptive management, unspecified: Secondary | ICD-10-CM | POA: Diagnosis not present

## 2024-02-26 DIAGNOSIS — Z6837 Body mass index (BMI) 37.0-37.9, adult: Secondary | ICD-10-CM | POA: Diagnosis not present

## 2024-02-26 DIAGNOSIS — Z1231 Encounter for screening mammogram for malignant neoplasm of breast: Secondary | ICD-10-CM | POA: Diagnosis not present

## 2024-02-26 MED ORDER — IRON SUCROSE 300 MG IVPB - SIMPLE MED
300.0000 mg | Status: DC
  Administered 2024-02-26: 300 mg via INTRAVENOUS
  Filled 2024-02-26: qty 300

## 2024-03-04 ENCOUNTER — Encounter (HOSPITAL_COMMUNITY)
Admission: RE | Admit: 2024-03-04 | Discharge: 2024-03-04 | Disposition: A | Discharge status: Home / Self Care | Source: Ambulatory Visit | Attending: Family Medicine

## 2024-03-04 DIAGNOSIS — D500 Iron deficiency anemia secondary to blood loss (chronic): Secondary | ICD-10-CM | POA: Diagnosis not present

## 2024-03-04 DIAGNOSIS — D509 Iron deficiency anemia, unspecified: Secondary | ICD-10-CM | POA: Diagnosis not present

## 2024-03-04 MED ORDER — IRON SUCROSE 300 MG IVPB - SIMPLE MED
300.0000 mg | Status: DC
  Administered 2024-03-04: 300 mg via INTRAVENOUS
  Filled 2024-03-04: qty 300

## 2024-03-10 DIAGNOSIS — D251 Intramural leiomyoma of uterus: Secondary | ICD-10-CM | POA: Diagnosis not present

## 2024-03-10 DIAGNOSIS — N924 Excessive bleeding in the premenopausal period: Secondary | ICD-10-CM | POA: Diagnosis not present

## 2024-03-11 ENCOUNTER — Encounter (HOSPITAL_COMMUNITY)

## 2024-03-27 ENCOUNTER — Telehealth: Payer: Self-pay

## 2024-03-27 NOTE — Telephone Encounter (Signed)
-----   Message from [name] sent at 03/26/2024  3:48 PM EDT ----- Regarding: FMLA appt Hey,  I received FMLA paperwork for this patient. Can we schedule her for an appt to fill this out  Thanks, [employer]

## 2024-03-27 NOTE — Telephone Encounter (Signed)
-----   Message from [facility] sent at 03/27/2024 12:51 PM EDT ----- Regarding: RE: FMLA appt That's fine thx ----- Message ----- From: Oneita Thersia LABOR, CMA Sent: 03/27/2024  11:24 AM EDT To: [name], DO Subject: FW: FMLA appt                                  I contacted the patient and she stated that she could only do Tuesdays and she needs something before September. Are you willing to have her double booked on one of your other Tuesdays that you are  in clinic ? ----- Message ----- From: Prescott Elyce, DO Sent: 03/26/2024   3:48 PM EDT To: [employer]of[employer] Subject: FMLA appt                                      Hey,  I received FMLA paperwork for this patient. Can we schedule her for an appt to fill this out  Thanks, [employer]

## 2024-03-27 NOTE — Telephone Encounter (Signed)
 Attempted to reach patient. A man named Donna Carlson answered. I just stated that I will call back at a better time. I was called to make an appt to come in to discuss FMLA paperwork. Nelson Land, CMA

## 2024-03-27 NOTE — Telephone Encounter (Signed)
 Patient is scheduled on 08/26 @335pm  with Dr.Everhart

## 2024-04-01 DIAGNOSIS — D250 Submucous leiomyoma of uterus: Secondary | ICD-10-CM | POA: Diagnosis not present

## 2024-04-01 DIAGNOSIS — Z309 Encounter for contraceptive management, unspecified: Secondary | ICD-10-CM | POA: Diagnosis not present

## 2024-04-01 DIAGNOSIS — G8929 Other chronic pain: Secondary | ICD-10-CM | POA: Diagnosis not present

## 2024-04-01 DIAGNOSIS — D219 Benign neoplasm of connective and other soft tissue, unspecified: Secondary | ICD-10-CM | POA: Diagnosis not present

## 2024-04-01 DIAGNOSIS — Z862 Personal history of diseases of the blood and blood-forming organs and certain disorders involving the immune mechanism: Secondary | ICD-10-CM | POA: Diagnosis not present

## 2024-04-01 DIAGNOSIS — M545 Low back pain, unspecified: Secondary | ICD-10-CM | POA: Diagnosis not present

## 2024-04-01 DIAGNOSIS — N840 Polyp of corpus uteri: Secondary | ICD-10-CM | POA: Diagnosis not present

## 2024-04-09 ENCOUNTER — Other Ambulatory Visit (HOSPITAL_COMMUNITY): Payer: Self-pay

## 2024-04-09 MED ORDER — IBUPROFEN 800 MG PO TABS
800.0000 mg | ORAL_TABLET | Freq: Three times a day (TID) | ORAL | 1 refills | Status: AC
  Filled 2024-04-09: qty 30, 10d supply, fill #0
  Filled 2024-06-11: qty 30, 10d supply, fill #1

## 2024-04-09 MED ORDER — LUPRON DEPOT (1-MONTH) 3.75 MG IM KIT
3.7500 mg | PACK | INTRAMUSCULAR | 11 refills | Status: AC
  Filled 2024-04-09 – 2024-04-11 (×3): qty 1, 30d supply, fill #0
  Filled 2024-05-15: qty 1, 30d supply, fill #1
  Filled 2024-06-11: qty 1, 30d supply, fill #2
  Filled 2024-07-10 – 2024-07-16 (×4): qty 1, 30d supply, fill #3
  Filled 2024-08-12: qty 1, 30d supply, fill #4
  Filled 2024-09-13: qty 1, 30d supply, fill #5

## 2024-04-10 ENCOUNTER — Other Ambulatory Visit (HOSPITAL_COMMUNITY): Payer: Self-pay

## 2024-04-11 ENCOUNTER — Other Ambulatory Visit (HOSPITAL_COMMUNITY): Payer: Self-pay

## 2024-04-14 ENCOUNTER — Other Ambulatory Visit (HOSPITAL_COMMUNITY): Payer: Self-pay

## 2024-04-14 NOTE — Progress Notes (Deleted)
    SUBJECTIVE:   CHIEF COMPLAINT / HPI:   Here to fill out FMLA paperwork   PERTINENT  PMH / PSH: mild persistent asthma, allergic rhinitis, GAD  OBJECTIVE:   There were no vitals taken for this visit.  ***  ASSESSMENT/PLAN:   Assessment & Plan      Donna Prescott, DO [facility]

## 2024-04-15 ENCOUNTER — Other Ambulatory Visit (HOSPITAL_COMMUNITY): Payer: Self-pay

## 2024-04-15 ENCOUNTER — Ambulatory Visit: Admitting: Family Medicine

## 2024-04-15 ENCOUNTER — Encounter: Payer: Self-pay | Admitting: Family Medicine

## 2024-04-16 DIAGNOSIS — D250 Submucous leiomyoma of uterus: Secondary | ICD-10-CM | POA: Diagnosis not present

## 2024-04-17 ENCOUNTER — Encounter: Admitting: Obstetrics & Gynecology

## 2024-05-15 ENCOUNTER — Other Ambulatory Visit: Payer: Self-pay

## 2024-05-15 ENCOUNTER — Other Ambulatory Visit: Payer: Self-pay | Admitting: Student

## 2024-05-15 ENCOUNTER — Other Ambulatory Visit (HOSPITAL_COMMUNITY): Payer: Self-pay

## 2024-05-15 DIAGNOSIS — J45909 Unspecified asthma, uncomplicated: Secondary | ICD-10-CM

## 2024-05-15 MED ORDER — ALBUTEROL SULFATE HFA 108 (90 BASE) MCG/ACT IN AERS
2.0000 | INHALATION_SPRAY | RESPIRATORY_TRACT | 0 refills | Status: DC | PRN
  Filled 2024-05-15: qty 18, 25d supply, fill #0

## 2024-05-16 ENCOUNTER — Other Ambulatory Visit: Payer: Self-pay

## 2024-05-16 ENCOUNTER — Other Ambulatory Visit (HOSPITAL_COMMUNITY): Payer: Self-pay

## 2024-05-16 ENCOUNTER — Other Ambulatory Visit: Payer: Self-pay | Admitting: Family Medicine

## 2024-05-16 DIAGNOSIS — J45909 Unspecified asthma, uncomplicated: Secondary | ICD-10-CM

## 2024-05-16 MED ORDER — ALBUTEROL SULFATE (2.5 MG/3ML) 0.083% IN NEBU
2.5000 mg | INHALATION_SOLUTION | RESPIRATORY_TRACT | 1 refills | Status: AC | PRN
  Filled 2024-05-16: qty 75, 5d supply, fill #0

## 2024-05-19 DIAGNOSIS — D250 Submucous leiomyoma of uterus: Secondary | ICD-10-CM | POA: Diagnosis not present

## 2024-06-10 DIAGNOSIS — F4323 Adjustment disorder with mixed anxiety and depressed mood: Secondary | ICD-10-CM | POA: Diagnosis not present

## 2024-06-11 ENCOUNTER — Other Ambulatory Visit (HOSPITAL_COMMUNITY): Payer: Self-pay

## 2024-06-11 ENCOUNTER — Other Ambulatory Visit: Payer: Self-pay | Admitting: Family Medicine

## 2024-06-11 ENCOUNTER — Other Ambulatory Visit: Payer: Self-pay

## 2024-06-11 DIAGNOSIS — J45909 Unspecified asthma, uncomplicated: Secondary | ICD-10-CM

## 2024-06-12 ENCOUNTER — Other Ambulatory Visit (HOSPITAL_COMMUNITY): Payer: Self-pay

## 2024-06-12 MED ORDER — BUDESONIDE-FORMOTEROL FUMARATE 160-4.5 MCG/ACT IN AERO
2.0000 | INHALATION_SPRAY | Freq: Two times a day (BID) | RESPIRATORY_TRACT | 3 refills | Status: AC
  Filled 2024-06-12: qty 10.2, 30d supply, fill #0
  Filled 2024-07-13: qty 10.2, 30d supply, fill #1
  Filled 2024-08-12: qty 10.2, 30d supply, fill #2

## 2024-06-16 DIAGNOSIS — D250 Submucous leiomyoma of uterus: Secondary | ICD-10-CM | POA: Diagnosis not present

## 2024-06-20 ENCOUNTER — Other Ambulatory Visit (HOSPITAL_COMMUNITY): Payer: Self-pay

## 2024-06-20 MED ORDER — FLUZONE 0.5 ML IM SUSY
0.5000 mL | PREFILLED_SYRINGE | Freq: Once | INTRAMUSCULAR | 0 refills | Status: AC
  Filled 2024-06-20: qty 0.5, 1d supply, fill #0

## 2024-06-24 DIAGNOSIS — F4323 Adjustment disorder with mixed anxiety and depressed mood: Secondary | ICD-10-CM | POA: Diagnosis not present

## 2024-07-01 DIAGNOSIS — R9389 Abnormal findings on diagnostic imaging of other specified body structures: Secondary | ICD-10-CM | POA: Diagnosis not present

## 2024-07-01 DIAGNOSIS — D251 Intramural leiomyoma of uterus: Secondary | ICD-10-CM | POA: Diagnosis not present

## 2024-07-10 ENCOUNTER — Other Ambulatory Visit (HOSPITAL_COMMUNITY): Payer: Self-pay

## 2024-07-11 ENCOUNTER — Other Ambulatory Visit (HOSPITAL_COMMUNITY): Payer: Self-pay

## 2024-07-13 ENCOUNTER — Other Ambulatory Visit: Payer: Self-pay

## 2024-07-14 ENCOUNTER — Other Ambulatory Visit (HOSPITAL_COMMUNITY): Payer: Self-pay

## 2024-07-14 ENCOUNTER — Other Ambulatory Visit: Payer: Self-pay | Admitting: Family Medicine

## 2024-07-14 ENCOUNTER — Other Ambulatory Visit: Payer: Self-pay

## 2024-07-14 DIAGNOSIS — D500 Iron deficiency anemia secondary to blood loss (chronic): Secondary | ICD-10-CM

## 2024-07-15 ENCOUNTER — Other Ambulatory Visit: Payer: Self-pay

## 2024-07-15 ENCOUNTER — Other Ambulatory Visit (HOSPITAL_COMMUNITY): Payer: Self-pay

## 2024-07-15 MED ORDER — FERROUS GLUCONATE 324 (37.5 FE) MG PO TABS
324.0000 mg | ORAL_TABLET | Freq: Two times a day (BID) | ORAL | 0 refills | Status: AC
  Filled 2024-07-15: qty 100, 50d supply, fill #0

## 2024-07-16 ENCOUNTER — Other Ambulatory Visit (HOSPITAL_COMMUNITY): Payer: Self-pay

## 2024-07-16 DIAGNOSIS — D250 Submucous leiomyoma of uterus: Secondary | ICD-10-CM | POA: Diagnosis not present

## 2024-08-12 ENCOUNTER — Other Ambulatory Visit (HOSPITAL_COMMUNITY): Payer: Self-pay

## 2024-08-12 ENCOUNTER — Other Ambulatory Visit: Payer: Self-pay

## 2024-08-13 ENCOUNTER — Other Ambulatory Visit (HOSPITAL_COMMUNITY): Payer: Self-pay

## 2024-09-12 ENCOUNTER — Other Ambulatory Visit (HOSPITAL_COMMUNITY): Payer: Self-pay

## 2024-09-12 ENCOUNTER — Encounter: Payer: Self-pay | Admitting: Family Medicine

## 2024-09-12 ENCOUNTER — Ambulatory Visit: Payer: Self-pay | Admitting: Student

## 2024-09-12 VITALS — BP 126/87 | HR 78 | Ht 60.0 in | Wt 215.8 lb

## 2024-09-12 DIAGNOSIS — L239 Allergic contact dermatitis, unspecified cause: Secondary | ICD-10-CM | POA: Diagnosis not present

## 2024-09-12 DIAGNOSIS — L299 Pruritus, unspecified: Secondary | ICD-10-CM | POA: Diagnosis not present

## 2024-09-12 MED ORDER — PREDNISONE 20 MG PO TABS
20.0000 mg | ORAL_TABLET | Freq: Every day | ORAL | 0 refills | Status: AC
  Filled 2024-09-12: qty 5, 5d supply, fill #0

## 2024-09-12 NOTE — Progress Notes (Signed)
" ° ° °  SUBJECTIVE:   CHIEF COMPLAINT / HPI:   Posterior head pain - Pain started on Wednesday evening, started as burning pain on back of her head, unilateral to the right side over occipital condyle. - Started using a new product and here 1 week ago - Provide pain worsened today, started to go down towards neck - No rash, but noticed  swelling/bumps - No drainage, no fever, no sore throat, no other systemic symptoms  OBJECTIVE:   BP 126/87   Pulse 78   Ht 5' (1.524 m)   Wt 215 lb 12.8 oz (97.9 kg)   SpO2 100%   BMI 42.15 kg/m    General: NAD, pleasant HEENT: Normocephalic, atraumatic head.  Anicteric.  Conjunctiva bilaterally.  Normal external ear, canal and TM bilaterally.  Normal pharynx. Normal dentition. Cardio: RRR, no MRG. Neck: No gross for me, no ecchymosis, posterior cervical adenopathy.  Full range of motion.  No TTP.  No rash.  5/5 strength.  Spurling negative. Respiratory: CTAB, normal wob on RA Skin: Warm and dry  ASSESSMENT/PLAN:   Assessment & Plan Allergic dermatitis -Suspect scalp dermatitis/posterior cervical lymphadenopathy 2/2 allergic dermatitis from new hair product. - Discontinue  product - 20 mg prednisone , daily for 5 days -Follow-up if symptoms worsen or fail to improve - Given burning sensation, did provide precautions for shingles  Gladis Church, DO [facility] "

## 2024-09-12 NOTE — Patient Instructions (Signed)
 It was great to see you! Thank you for allowing me to participate in your care!   I recommend that you always bring your medications to each appointment as this makes it easy to ensure we are on the correct medications and helps us  not miss when refills are needed.  Our plans for today:  - Please stop using the new product for your hair. - Please take 1 tablet of prednisone  every morning with breakfast to help with burning/itching - Continue to use Vaseline - Follow-up if symptoms fail to improve or worsen  Take care and seek immediate care sooner if you develop any concerns. Please remember to show up 15 minutes before your scheduled appointment time!  Gladis Church, DO [facility]

## 2024-09-12 NOTE — Progress Notes (Deleted)
" ° ° °  SUBJECTIVE:   CHIEF COMPLAINT / HPI: ***  Discussed the use of AI scribe software for clinical note transcription with the patient, who gave verbal consent to proceed.  History of Present Illness     PERTINENT  PMH / PSH: asthma, allergic rhinitis, GAD, IDA  OBJECTIVE:   BP (!) 133/94   Pulse 68   Ht 5' (1.524 m)   Wt 215 lb 12.8 oz (97.9 kg)   SpO2 100%   BMI 42.15 kg/m   Physical Exam    ASSESSMENT/PLAN:   Assessment & Plan  Assessment and Plan Assessment & Plan      No follow-ups on file.  Dr. [name], DO [facility]   "

## 2024-09-16 ENCOUNTER — Other Ambulatory Visit: Payer: Self-pay | Admitting: Family Medicine

## 2024-09-16 ENCOUNTER — Other Ambulatory Visit (HOSPITAL_COMMUNITY): Payer: Self-pay

## 2024-09-16 DIAGNOSIS — J45909 Unspecified asthma, uncomplicated: Secondary | ICD-10-CM

## 2024-09-17 ENCOUNTER — Other Ambulatory Visit (HOSPITAL_COMMUNITY): Payer: Self-pay

## 2024-09-17 MED ORDER — ALBUTEROL SULFATE HFA 108 (90 BASE) MCG/ACT IN AERS
2.0000 | INHALATION_SPRAY | RESPIRATORY_TRACT | 0 refills | Status: AC | PRN
  Filled 2024-09-17: qty 18, 25d supply, fill #0

## 2024-09-18 ENCOUNTER — Other Ambulatory Visit (HOSPITAL_COMMUNITY): Payer: Self-pay

## 2024-09-19 ENCOUNTER — Other Ambulatory Visit (HOSPITAL_COMMUNITY): Payer: Self-pay

## 2024-09-21 ENCOUNTER — Other Ambulatory Visit: Payer: Self-pay

## 2024-09-22 ENCOUNTER — Other Ambulatory Visit (HOSPITAL_COMMUNITY): Payer: Self-pay

## 2024-09-24 ENCOUNTER — Other Ambulatory Visit: Payer: Self-pay

## 2024-09-24 ENCOUNTER — Other Ambulatory Visit (HOSPITAL_COMMUNITY): Payer: Self-pay

## 2024-09-24 MED ORDER — ORILISSA 150 MG PO TABS
150.0000 mg | ORAL_TABLET | Freq: Every day | ORAL | 2 refills | Status: AC
  Filled 2024-09-24: qty 30, 30d supply, fill #0

## 2024-09-25 ENCOUNTER — Other Ambulatory Visit (HOSPITAL_COMMUNITY): Payer: Self-pay

## 2024-09-25 ENCOUNTER — Other Ambulatory Visit: Payer: Self-pay

## 2024-09-25 ENCOUNTER — Other Ambulatory Visit: Payer: Self-pay | Admitting: Family Medicine

## 2024-09-25 DIAGNOSIS — E66812 Obesity, class 2: Secondary | ICD-10-CM
